# Patient Record
Sex: Male | Born: 1970 | Race: White | Hispanic: No | Smoking: Former smoker
Health system: Southern US, Community
[De-identification: ages and names within clinical notes are randomized; demographics above are authoritative.]

## PROBLEM LIST (undated history)

## (undated) DIAGNOSIS — K859 Acute pancreatitis without necrosis or infection, unspecified: Secondary | ICD-10-CM

## (undated) DIAGNOSIS — E119 Type 2 diabetes mellitus without complications: Secondary | ICD-10-CM

## (undated) DIAGNOSIS — I10 Essential (primary) hypertension: Secondary | ICD-10-CM

## (undated) DIAGNOSIS — G8929 Other chronic pain: Secondary | ICD-10-CM

## (undated) DIAGNOSIS — I2510 Atherosclerotic heart disease of native coronary artery without angina pectoris: Secondary | ICD-10-CM

## (undated) DIAGNOSIS — Z800 Family history of malignant neoplasm of digestive organs: Secondary | ICD-10-CM

## (undated) DIAGNOSIS — Z833 Family history of diabetes mellitus: Secondary | ICD-10-CM

## (undated) DIAGNOSIS — M109 Gout, unspecified: Secondary | ICD-10-CM

## (undated) DIAGNOSIS — F1010 Alcohol abuse, uncomplicated: Secondary | ICD-10-CM

## (undated) DIAGNOSIS — M549 Dorsalgia, unspecified: Secondary | ICD-10-CM

## (undated) DIAGNOSIS — Z8249 Family history of ischemic heart disease and other diseases of the circulatory system: Secondary | ICD-10-CM

## (undated) DIAGNOSIS — E781 Pure hyperglyceridemia: Secondary | ICD-10-CM

## (undated) DIAGNOSIS — Z720 Tobacco use: Secondary | ICD-10-CM

## (undated) DIAGNOSIS — I5189 Other ill-defined heart diseases: Secondary | ICD-10-CM

## (undated) HISTORY — DX: Atherosclerotic heart disease of native coronary artery without angina pectoris: I25.10

## (undated) HISTORY — PX: NO PAST SURGERIES: SHX2092

---

## 2005-10-08 ENCOUNTER — Emergency Department: Payer: Self-pay | Admitting: Emergency Medicine

## 2006-06-14 ENCOUNTER — Emergency Department: Payer: Self-pay | Admitting: Emergency Medicine

## 2010-11-05 ENCOUNTER — Inpatient Hospital Stay: Payer: Self-pay | Admitting: Psychiatry

## 2010-11-08 ENCOUNTER — Ambulatory Visit: Payer: Self-pay | Admitting: Unknown Physician Specialty

## 2010-11-30 ENCOUNTER — Ambulatory Visit: Payer: Self-pay | Admitting: Unknown Physician Specialty

## 2011-01-07 ENCOUNTER — Ambulatory Visit: Payer: Self-pay | Admitting: Internal Medicine

## 2011-01-28 DIAGNOSIS — K219 Gastro-esophageal reflux disease without esophagitis: Secondary | ICD-10-CM | POA: Insufficient documentation

## 2011-01-28 DIAGNOSIS — R7302 Impaired glucose tolerance (oral): Secondary | ICD-10-CM | POA: Insufficient documentation

## 2011-01-28 DIAGNOSIS — Z720 Tobacco use: Secondary | ICD-10-CM | POA: Insufficient documentation

## 2011-01-28 DIAGNOSIS — M545 Low back pain, unspecified: Secondary | ICD-10-CM | POA: Insufficient documentation

## 2011-01-28 DIAGNOSIS — I10 Essential (primary) hypertension: Secondary | ICD-10-CM

## 2011-01-28 DIAGNOSIS — E781 Pure hyperglyceridemia: Secondary | ICD-10-CM

## 2011-01-28 DIAGNOSIS — M109 Gout, unspecified: Secondary | ICD-10-CM | POA: Insufficient documentation

## 2011-01-28 DIAGNOSIS — G8929 Other chronic pain: Secondary | ICD-10-CM | POA: Insufficient documentation

## 2011-10-07 ENCOUNTER — Emergency Department: Payer: Self-pay | Admitting: Emergency Medicine

## 2011-10-07 LAB — CBC
HCT: 46.4 % (ref 40.0–52.0)
HGB: 16.3 g/dL (ref 13.0–18.0)
MCHC: 35.2 g/dL (ref 32.0–36.0)
MCV: 96 fL (ref 80–100)
Platelet: 173 10*3/uL (ref 150–440)
RBC: 4.83 10*6/uL (ref 4.40–5.90)

## 2011-10-07 LAB — TROPONIN I: Troponin-I: <0.02 ng/mL

## 2011-10-07 LAB — COMPREHENSIVE METABOLIC PANEL WITH GFR
Albumin: 4.1 g/dL
Alkaline Phosphatase: 84 U/L
Anion Gap: 11
BUN: 8 mg/dL
Bilirubin,Total: 1 mg/dL
Calcium, Total: 9 mg/dL
Chloride: 101 mmol/L
Co2: 25 mmol/L
Creatinine: 0.72 mg/dL
EGFR (African American): >60
EGFR (Non-African Amer.): >60
Glucose: 123 mg/dL — ABNORMAL HIGH
Osmolality: 274
Potassium: 3.7 mmol/L
SGOT(AST): 51 U/L — ABNORMAL HIGH
SGPT (ALT): 52 U/L
Sodium: 137 mmol/L
Total Protein: 8 g/dL

## 2012-02-14 ENCOUNTER — Emergency Department: Payer: Self-pay | Admitting: Emergency Medicine

## 2012-07-14 ENCOUNTER — Emergency Department: Payer: Self-pay | Admitting: Emergency Medicine

## 2012-07-14 LAB — COMPREHENSIVE METABOLIC PANEL
Anion Gap: 10 (ref 7–16)
BUN: 12 mg/dL (ref 7–18)
Calcium, Total: 9.4 mg/dL (ref 8.5–10.1)
Chloride: 109 mmol/L — ABNORMAL HIGH (ref 98–107)
EGFR (African American): >60
EGFR (Non-African Amer.): >60
Glucose: 146 mg/dL — ABNORMAL HIGH (ref 65–99)
SGOT(AST): 54 U/L — ABNORMAL HIGH (ref 15–37)

## 2012-07-14 LAB — CBC
HGB: 16.6 g/dL (ref 13.0–18.0)
RBC: 5.07 10*6/uL (ref 4.40–5.90)

## 2012-08-17 ENCOUNTER — Inpatient Hospital Stay: Payer: Self-pay | Admitting: Family Medicine

## 2012-08-17 LAB — CBC
HGB: 15 g/dL (ref 13.0–18.0)
MCH: 31.4 pg (ref 26.0–34.0)
MCHC: 33.8 g/dL (ref 32.0–36.0)
MCV: 93 fL (ref 80–100)
Platelet: 259 10*3/uL (ref 150–440)
RBC: 4.78 10*6/uL (ref 4.40–5.90)
RDW: 13.4 % (ref 11.5–14.5)
WBC: 23.8 10*3/uL — ABNORMAL HIGH (ref 3.8–10.6)

## 2012-08-17 LAB — BASIC METABOLIC PANEL
Anion Gap: 11 (ref 7–16)
BUN: 28 mg/dL — ABNORMAL HIGH (ref 7–18)
Calcium, Total: 9.2 mg/dL (ref 8.5–10.1)
Chloride: 97 mmol/L — ABNORMAL LOW (ref 98–107)
Co2: 21 mmol/L (ref 21–32)
Creatinine: 1.99 mg/dL — ABNORMAL HIGH (ref 0.60–1.30)
EGFR (African American): 47 — ABNORMAL LOW
Glucose: 212 mg/dL — ABNORMAL HIGH (ref 65–99)
Osmolality: 271 (ref 275–301)
Sodium: 129 mmol/L — ABNORMAL LOW (ref 136–145)

## 2012-08-17 LAB — URINALYSIS, COMPLETE
Bilirubin,UR: NEGATIVE
Blood: NEGATIVE
Glucose,UR: NEGATIVE mg/dL (ref 0–75)
Ketone: NEGATIVE
Nitrite: NEGATIVE
Ph: 5 (ref 4.5–8.0)
Protein: NEGATIVE
RBC,UR: <1 /HPF (ref 0–5)
Specific Gravity: 1.011 (ref 1.003–1.030)
Squamous Epithelial: <1
WBC UR: 2 /HPF (ref 0–5)

## 2012-08-17 LAB — CK TOTAL AND CKMB (NOT AT ARMC)
CK, Total: 53 U/L (ref 35–232)
CK-MB: <0.5 ng/mL — ABNORMAL LOW (ref 0.5–3.6)

## 2012-08-17 LAB — HEPATIC FUNCTION PANEL A (ARMC)
Albumin: 3.9 g/dL (ref 3.4–5.0)
Alkaline Phosphatase: 98 U/L (ref 50–136)
Total Protein: 7.4 g/dL (ref 6.4–8.2)

## 2012-08-17 LAB — DIFFERENTIAL
Basophil %: 0.8 %
Basophil: 1 %
Comment - H1-Com1: NORMAL
Eosinophil #: 0.1 10*3/uL (ref 0.0–0.7)
Eosinophil %: 0.2 %
Lymphocyte #: 2.8 10*3/uL (ref 1.0–3.6)
Lymphocytes: 14 %
Monocyte #: 2.3 x10 3/mm — ABNORMAL HIGH (ref 0.2–1.0)
Monocyte %: 9.5 %
Monocytes: 8 %
Segmented Neutrophils: 77 %

## 2012-08-17 LAB — TROPONIN I: Troponin-I: <0.02 ng/mL

## 2012-08-18 LAB — BASIC METABOLIC PANEL
Anion Gap: 8 (ref 7–16)
BUN: 28 mg/dL — ABNORMAL HIGH (ref 7–18)
Calcium, Total: 8.6 mg/dL (ref 8.5–10.1)
Co2: 25 mmol/L (ref 21–32)
EGFR (African American): >60
Osmolality: 275 (ref 275–301)
Potassium: 3.8 mmol/L (ref 3.5–5.1)
Sodium: 134 mmol/L — ABNORMAL LOW (ref 136–145)

## 2012-08-18 LAB — CBC WITH DIFFERENTIAL/PLATELET
Basophil #: 0.2 10*3/uL — ABNORMAL HIGH (ref 0.0–0.1)
Basophil %: 1 %
HCT: 40.7 % (ref 40.0–52.0)
Lymphocyte %: 22.2 %
MCH: 30.8 pg (ref 26.0–34.0)
MCHC: 33.2 g/dL (ref 32.0–36.0)
MCV: 93 fL (ref 80–100)
Monocyte #: 2 x10 3/mm — ABNORMAL HIGH (ref 0.2–1.0)
Neutrophil %: 63.9 %
WBC: 18.6 10*3/uL — ABNORMAL HIGH (ref 3.8–10.6)

## 2012-08-18 LAB — MAGNESIUM: Magnesium: 1.5 mg/dL — ABNORMAL LOW

## 2012-08-19 LAB — URINE CULTURE

## 2012-08-23 LAB — CULTURE, BLOOD (SINGLE)

## 2012-09-25 DIAGNOSIS — M542 Cervicalgia: Secondary | ICD-10-CM | POA: Insufficient documentation

## 2013-04-30 ENCOUNTER — Emergency Department: Payer: Self-pay | Admitting: Internal Medicine

## 2013-06-26 ENCOUNTER — Emergency Department: Payer: Self-pay | Admitting: Emergency Medicine

## 2013-09-22 ENCOUNTER — Inpatient Hospital Stay: Payer: Self-pay | Admitting: Psychiatry

## 2013-09-22 LAB — URINALYSIS, COMPLETE
Bacteria: NONE SEEN
Bilirubin,UR: NEGATIVE
Glucose,UR: NEGATIVE mg/dL (ref 0–75)
KETONE: NEGATIVE
Leukocyte Esterase: NEGATIVE
Nitrite: NEGATIVE
PH: 5 (ref 4.5–8.0)
Protein: NEGATIVE
Specific Gravity: 1.006 (ref 1.003–1.030)

## 2013-09-22 LAB — COMPREHENSIVE METABOLIC PANEL
ALK PHOS: 155 U/L — AB
Albumin: 4.2 g/dL (ref 3.4–5.0)
Anion Gap: 7 (ref 7–16)
BUN: 9 mg/dL (ref 7–18)
Bilirubin,Total: 0.6 mg/dL (ref 0.2–1.0)
CHLORIDE: 102 mmol/L (ref 98–107)
Calcium, Total: 8.3 mg/dL — ABNORMAL LOW (ref 8.5–10.1)
Co2: 24 mmol/L (ref 21–32)
Creatinine: 0.8 mg/dL (ref 0.60–1.30)
EGFR (African American): >60
EGFR (Non-African Amer.): >60
GLUCOSE: 165 mg/dL — AB (ref 65–99)
Osmolality: 269 (ref 275–301)
POTASSIUM: 4.4 mmol/L (ref 3.5–5.1)
SGOT(AST): 58 U/L — ABNORMAL HIGH (ref 15–37)
SGPT (ALT): 57 U/L (ref 12–78)
SODIUM: 133 mmol/L — AB (ref 136–145)
Total Protein: 8.4 g/dL — ABNORMAL HIGH (ref 6.4–8.2)

## 2013-09-22 LAB — CBC
HCT: 52.6 % — AB (ref 40.0–52.0)
HGB: 18.4 g/dL — AB (ref 13.0–18.0)
MCH: 33.9 pg (ref 26.0–34.0)
MCHC: 35 g/dL (ref 32.0–36.0)
MCV: 97 fL (ref 80–100)
Platelet: 240 10*3/uL (ref 150–440)
RBC: 5.42 10*6/uL (ref 4.40–5.90)
RDW: 12.3 % (ref 11.5–14.5)
WBC: 16.5 10*3/uL — ABNORMAL HIGH (ref 3.8–10.6)

## 2013-09-22 LAB — SALICYLATE LEVEL: SALICYLATES, SERUM: 4.5 mg/dL — AB

## 2013-09-22 LAB — ACETAMINOPHEN LEVEL: Acetaminophen: <2 ug/mL

## 2013-09-22 LAB — ETHANOL
Ethanol %: <0.003 % (ref 0.000–0.080)
Ethanol: <3 mg/dL

## 2013-09-22 LAB — DRUG SCREEN, URINE
Amphetamines, Ur Screen: NEGATIVE (ref ?–1000)
BENZODIAZEPINE, UR SCRN: NEGATIVE (ref ?–200)
Barbiturates, Ur Screen: POSITIVE (ref ?–200)
CANNABINOID 50 NG, UR ~~LOC~~: POSITIVE (ref ?–50)
COCAINE METABOLITE, UR ~~LOC~~: POSITIVE (ref ?–300)
MDMA (ECSTASY) UR SCREEN: NEGATIVE (ref ?–500)
Methadone, Ur Screen: NEGATIVE (ref ?–300)
OPIATE, UR SCREEN: NEGATIVE (ref ?–300)
Phencyclidine (PCP) Ur S: NEGATIVE (ref ?–25)
Tricyclic, Ur Screen: NEGATIVE (ref ?–1000)

## 2013-09-25 LAB — URIC ACID: Uric Acid: 6.5 mg/dL (ref 3.5–7.2)

## 2013-10-18 ENCOUNTER — Ambulatory Visit: Payer: Self-pay | Admitting: Podiatry

## 2014-01-02 ENCOUNTER — Emergency Department: Payer: Self-pay | Admitting: Emergency Medicine

## 2014-01-24 ENCOUNTER — Emergency Department: Payer: Self-pay | Admitting: Emergency Medicine

## 2014-01-24 LAB — CBC WITH DIFFERENTIAL/PLATELET
BASOS ABS: 0.1 10*3/uL (ref 0.0–0.1)
BASOS PCT: 1 %
EOS PCT: 2.1 %
Eosinophil #: 0.3 10*3/uL (ref 0.0–0.7)
HCT: 43.2 % (ref 40.0–52.0)
HGB: 14.6 g/dL (ref 13.0–18.0)
Lymphocyte #: 2.9 10*3/uL (ref 1.0–3.6)
Lymphocyte %: 19.7 %
MCH: 33.2 pg (ref 26.0–34.0)
MCHC: 33.8 g/dL (ref 32.0–36.0)
MCV: 98 fL (ref 80–100)
Monocyte #: 1 x10 3/mm (ref 0.2–1.0)
Monocyte %: 6.8 %
NEUTROS PCT: 70.4 %
Neutrophil #: 10.4 10*3/uL — ABNORMAL HIGH (ref 1.4–6.5)
Platelet: 179 10*3/uL (ref 150–440)
RBC: 4.4 10*6/uL (ref 4.40–5.90)
RDW: 12.5 % (ref 11.5–14.5)
WBC: 14.7 10*3/uL — AB (ref 3.8–10.6)

## 2014-01-24 LAB — BASIC METABOLIC PANEL
Anion Gap: 4 — ABNORMAL LOW (ref 7–16)
BUN: 19 mg/dL — ABNORMAL HIGH (ref 7–18)
CALCIUM: 8.8 mg/dL (ref 8.5–10.1)
CREATININE: 0.98 mg/dL (ref 0.60–1.30)
Chloride: 100 mmol/L (ref 98–107)
Co2: 27 mmol/L (ref 21–32)
Glucose: 164 mg/dL — ABNORMAL HIGH (ref 65–99)
OSMOLALITY: 269 (ref 275–301)
Potassium: 3.8 mmol/L (ref 3.5–5.1)
Sodium: 131 mmol/L — ABNORMAL LOW (ref 136–145)

## 2014-01-24 LAB — URIC ACID: Uric Acid: 9.7 mg/dL — ABNORMAL HIGH (ref 3.5–7.2)

## 2014-09-28 DIAGNOSIS — F32A Depression, unspecified: Secondary | ICD-10-CM | POA: Insufficient documentation

## 2014-10-21 NOTE — Discharge Summary (Signed)
PATIENT NAME:  Donald Hart, Donald Hart MR#:  [id number] DATE OF BIRTH:  May 06, 1971  DATE OF ADMISSION:  08/17/2012 DATE OF DISCHARGE:  08/18/2012  REASON FOR ADMISSION: Back pain and weakness.   FINAL DIAGNOSES:  1. Chronic back pain with exacerbation.  2. Gouty arthritis.  3. Dehydration.  4. Acute kidney injury related to dehydration.  5. Hyperglycemia due to stress.  6. Hyponatremia due to the use of chlorthalidone and dehydration.  7. Elevated white blood count likely due to stress and dehydration.   IMPORTANT RESULTS: Glucose 212 on admission, 117 at discharge. BUN 28. Creatinine 1.99 on admission, down to 1.28 at discharge. Sodium 129 on admission, 134 at discharge. LFTs within normal limits. Troponin was negative. White count on admission 23,000, at discharge 18.6. Blood cultures no growth. Urine culture is no growth. Urinalysis: No signs of infection.   EKG: Normal sinus rhythm.   DISPOSITION: Home. The patient would like to go home anyway because he feels like he cannot afford to lose more work. The patient wants to return to work tomorrow.   MEDICATIONS AT DISCHARGE: Amlodipine 10 mg once a day, citalopram 20 mg once a day, lisinopril 40 mg once a day (start the following day on 08/19/2012, hold it for discharge day), acetaminophen with oxycodone every 4 hours p.r.n. pain, new prescription of Uloric or febuxostat 40 mg once a day for gout, prednisone 20 mg tablet 2 tablets orally once a day for gout for 2 more days.   Recommended the patient to stop chlorthalidone as this will exacerbate his gout and decrease his sodium. The patient came with gout and with hyponatremia. Allopurinol has been stopped as it is not working as per the patient can tell me.   His primary care physician is Dr. Zada Finderslmedo. The patient is to follow up with him due to a couple of things:  1. Chest x-ray showed a left upper lobe abnormality that might be a scar, but it needs to be followed up. So, recommend for Dr.  Zada Finderslmedo to repeat an AP and lateral chest x-ray and if that abnormality is still present, to proceed getting a CT scan. The patient is aware of this, and he has been told to talk to Dr. Zada Finderslmedo about this.   HOSPITAL COURSE: The patient had admission 08/17/2012, likely having a polyarticular gout flare. Finished prolonged prednisone taper the day before. The patient has allopurinol and colchicine and presented with worsening back pain, with severe dehydration and acute kidney injury. The patient's creatinine was 1.99, and his sodium was 129. The patient looked severely dehydrated, and his white count was 23,000, likely due to steroids. The patient had drop of his white count prior to discharge, but he was put back on steroids due to severe flare-up. The patient has been recommended to go to see Dr. Zada Finderslmedo. He says that he needs to go home and go back to work as he cannot afford to lose more work.   The patient has been given a prescription for Uloric as change of prescription as the allopurinol was not working. The patient was also taking chlorthalidone which one of the side effects is exacerbation of gout, for what I stopped that medication. He also came with acute kidney injury and hyponatremia, for what that medication will make those things worse as well. He is taking an ACE inhibitor which I told him to hold for another day and to start taking it on the 19th as he came with acute kidney injury. His  creatinine is normal now.    TIME SPENT: I spent about 40 minutes with this discharge.     ____________________________ Felipa Furnace, MD rsg:gb Hart: 08/20/2012 16:53:26 ET T: 08/21/2012 04:13:24 ET JOB#: [id number]  cc: Felipa Furnace, MD, <Dictator> [name], MD Danialle Dement Juanda Chance MD ELECTRONICALLY SIGNED 08/22/2012 22:45

## 2014-10-21 NOTE — H&P (Signed)
PATIENT NAME:  Donald Hart, Donald Hart MR#:  [id number] DATE OF BIRTH:  09-Jun-1971  DATE OF ADMISSION:  08/17/2012  PRIMARY CARE PHYSICIAN: [facility]. REFERRING PHYSICIAN: Dorothea GlassmanPaul Malinda, MD  CHIEF COMPLAINT: Weakness, back pain.   HISTORY OF PRESENT ILLNESS: The patient is a 44 year old Caucasian male with a history of polyarticular gout with a recent gout flare, who had just finished a prolonged prednisone taper yesterday or the day before. In addition, allopurinol and colchicine, hypertension with chronic back pain, who presents with above chief complaint. The patient stated that he has been experiencing progressive crescendo-type of a back pain mostly in the lumbar spine area, but it was worse today. The patient has been having this pain for multiple months. The patient also complained of some neck pain without significant photophobia or neck stiffness. He has had poor p.o. intake, feels weak and dizzy when he stands up. He has been taking his blood pressure medications including diuretic and ACE inhibitor. On arrival, he was noted to have renal failure with creatinine of 1.99 and sodium of 129 and some leukocytosis and WBC of 23.8000. A CT of abdomen and pelvis without contrast was performed, which did not show any ureterolithiasis or  obstructive uropathy. X-ray of the chest does not show any significant evidence for pneumonia. Hospitalist services were contacted for further evaluation and management. The patient has no complaints of fever, but feels overall weak   REVIEW OF SYSTEMS:   CONSTITUTIONAL: No fever, weakness, fatigue. No weight changes.  EYES: No blurry vision or double vision.  ENT: No tinnitus or hearing loss. No discharge.  RESPIRATORY: No significant wheeze, has dry cough. No diagnosis of COPD or asthma.  CARDIOVASCULAR: Denies chest pain, swelling in the legs or dyspnea on exertion, has high blood pressure.  GASTROINTESTINAL: Some nausea. No vomiting, abdominal pain or rectal bleeding.   GENITOURINARY: Denies dysuria or hematuria.  HEMATOLOGIC/LYMPHATIC: Denies anemia or easy bruising.  ENDOCRINE: Denies polyuria or nocturia.  SKIN: Denies any new rashes.  MUSCULOSKELETAL: Has chronic back pain.   NEUROLOGIC: Denies numbness or focal weakness. PSYCHIATRIC:  Denies anxiety or depression or insomnia.   PHYSICAL EXAMINATION: VITAL SIGNS: Temperature on arrival 98.3, pulse 106, respiratory rate 20,  initially blood pressure initially was 102/56, oxygen sat 100% on room air.  GENERAL: The patient well-developed Caucasian male sitting in bed in no obvious distress.  HEENT: Normocephalic, atraumatic. Pupils are equal and reactive without any evidence for photophobia. Extraocular muscles intact. There is point tenderness on cervical spine on multiple joints palpation.  Dry mucous membranes.  NECK: Supple. No thyroid tenderness. No cervical lymphadenopathy. Good range of motion of the neck on both sides. Negative Kernig and Brudzinski.    CARDIOVASCULAR: S1, S2, regular rate and rhythm. No murmurs, rubs, or gallops.  LUNGS: Clear to auscultation. [name] or rhonchi.  ABDOMEN: Soft, nontender, nondistended. Positive bowel sounds in all quadrants.  EXTREMITIES: No significant lower extremity edema.  NEUROLOGIC: Cranial nerves II through XII grossly intact. No photophobia. Positive straight leg raise on the left more so than the right. Negative Kernig and Brudzinski. Strength is 5/5 all extremities, but examination is limited due to pain elicited on lower extremity movements. Sensation is intact to light touch.  PSYCHIATRIC: Awake, alert, oriented x 3. Pleasant, cooperative.    PAST MEDICAL HISTORY: Gout, hypertension, chronic back pain.   SOCIAL HISTORY: Still smokes a pack a day and no alcohol, although he was a heavy drinker for a couple of decades in the past, stopped  it secondary to precipitation of gout. No drug use. Works in Orthoptist heavy objects.   ALLERGIES:  None.   PAST SURGICAL HISTORY: He denies.   FAMILY HISTORY: Dad with diabetes.   OUTPATIENT MEDICATIONS: Allopurinol 300 mg 2 times a day, amlodipine 10 mg daily, chlorthalidone 25 mg daily, citalopram 20 mg daily, lisinopril 40 mg daily.   LABORATORY DATA: Glucose 212, BUN 28, creatinine 1.99, sodium 129, potassium 4.2, chloride 97. LFTs within normal limits. Troponin negative. CK-MB negative x 1. WBC 23.8000, hemoglobin 15, hematocrit 44.4 and platelets 259.   Urinalysis not suggestive of infection.   CT of the abdomen and pelvis as above.  EKG showing normal sinus rhythm, rate is 94, no acute ST elevations or depressions.   ASSESSMENT AND PLAN: We have a 44 year old Caucasian male with history of gout, hypertension, chronic back pain for several months. This has progressed. Presents with above chief complaint and was found with acute renal failure, hyponatremia. The patient does have some back pain and some leukocytosis as well. In regards to the acute renal failure, this is likely secondary to poor p.o. intake in addition to taking his medication, including ACE inhibitor, lisinopril, chlorthalidone as well as colchicine. He has had poor p.o. intake for a couple of weeks and the possibility of acute tubular necrosis is there. I will start him on some IV fluids and see how he does. We would hold nephrotoxins and hydrate the patient. His hyponatremia is likely secondary to dehydration and diuretic use, which we would hold at this point and see how he does with normal saline. His back pain appears to be radicular with positive leg raise. He works in a Orthoptist heavy objects and boxes. We will get a CT of lumbar spine and start him on some morphine p.r.n. and obtain a physical therapy consult.  We would hold his ACE inhibitor and diuretics, continue his amlodipine and see how he does with IV fluids as his blood pressure is borderline given he is dehydrated. He does have tobacco abuse. He was  counseled for 3 minutes. Start him on heparin for deep vein thrombosis prophylaxis.   TOTAL TIME SPENT: 55 minutes.   CODE STATUS: Patient is full code.    ____________________________ Krystal Eaton, MD sa:cc Hart: 08/17/2012 21:39:10 ET T: 08/18/2012 00:10:05 ET JOB#: [id number]  cc: Krystal Eaton, MD, <Dictator> Krystal Eaton MD ELECTRONICALLY SIGNED 09/07/2012 13:07

## 2014-10-22 NOTE — Discharge Summary (Signed)
PATIENT NAME:  Donald Hart, Donald Hart MR#:  [id number] DATE OF BIRTH:  October 24, 1970  DATE OF ADMISSION:  09/22/2013 DATE OF DISCHARGE:  09/27/2013  HOSPITAL COURSE: See dictated history and physical for details of admission. A 44 year old man admitted to the psychiatry service for substance-induced depression. Mood was depressed and hopeless. The patient required detox from alcohol and opiates. Also having chronic pain. He was treated in the hospital with the alcohol detox protocol as well as a 3-day taper of Suboxone. Supportive therapy was done as well as educational therapy and substance abuse counseling. No antidepressants were required. Mood improved and at no time was he reporting suicidal ideation. He did not have seizures or delirium. The patient does continue to have severe chronic pain. I had requested a rheumatology consult but it was not available over the weekend. The patient wants to be discharged today so he is being discharged and will have to follow up with rheumatology as an outpatient. I did check is uric acid level which was normal. We x-rayed his foot to document some of his arthritis. He has been given a 4-day burst of steroids to try and ease some of the pain in his joints. He has been given time off work for the next week to try and get some medical treatment. He is discharged with a plan that he will follow psychiatrically with [facility].   MENTAL STATUS EXAMINATION AT DISCHARGE: Casually dressed, neatly groomed man looks his stated age. Cooperative with the interview. Eye contact good. Psychomotor activity still a little bit slow. Affect a little blunted. Mood stated as being okay. Thoughts are lucid, a little bit slow, but not disorganized or bizarre. Denies auditory or visual hallucinations. Denies suicidal or homicidal ideation. Insight and judgment good. Intelligence normal. Short and long-term memory intact. Alert and oriented x 4.   LABORATORY RESULTS: Admission labs included a drug screen  positive for cocaine, cannabis, and barbiturates. Glucose level elevated at 165, sodium low 133, calcium low 8.5. Alcohol not detected. CBC: Increased white count 16.5, increased hematocrit 52.6. Urinalysis: A little bit of blood not infected. Uric after level 6.5 on the 28th.   An x-ray of the left foot done to document his arthritis showed early degenerative changes at the first MTP joint and DIP joints with diffuse joint space narrowing. No bony abnormality.   DISCHARGE MEDICATIONS: Prednisone 20 mg 2 tablets a day for the next 2 days only, meloxicam 15 mg a day, lisinopril 40 mg per day, Catapres patch 0.1 mg weekly, colchicine 0.6 mg once a day, pantoprazole 40 mg twice, and febuxostat 40 mg twice a day.   DISPOSITION: Discharge home. Follow up with [facility] and with Dr. Gavin PottersKernodle this week.   DIAGNOSIS, PRINCIPAL AND PRIMARY:   AXIS I: Substance-induced depression revolved.   SECONDARY DIAGNOSES:  AXIS I: Alcohol dependence.  Opiate dependence.   AXIS II: Deferred.   AXIS III: Chronic severe pain, gastric reflux disease, hypertension, gout.   AXIS IV: Severe from pain and how it is affecting his work and home life.   AXIS V: Functioning at time of discharge 57.    ____________________________ Audery AmelJohn T. Akasha Melena, MD jtc:lt Hart: 09/27/2013 23:31:37 ET T: 09/28/2013 06:11:01 ET JOB#: [id number]  cc: Audery AmelJohn T. Clarke Peretz, MD, <Dictator> Audery AmelJOHN T Talah Cookston MD ELECTRONICALLY SIGNED 09/28/2013 11:07

## 2014-10-22 NOTE — H&P (Signed)
PATIENT NAME:  Donald Hart, Donald Hart MR#:  [id number] DATE OF BIRTH:  Oct 19, 1970  DATE OF ADMISSION:  09/22/2013  IDENTIFYING INFORMATION AND CHIEF COMPLAINT: This is a 44 year old gentleman with a history of chronic pain, depressed mood, and recent suicidal ideation in the context of heavy substance abuse, voluntarily coming to the hospital.   CHIEF COMPLAINT: "I need help."   HISTORY OF PRESENT ILLNESS: Information obtained from the patient and the chart. The patient reports that his mood has been feeling depressed and down. Has hopeless feelings. Tired all the time. Cannot sleep at night. Has started having some hopeless thoughts or thoughts about wishing that he were dead. He is drinking alcohol, approximately 12 beers a day, which has been escalating. He feels that is out of control. Additionally, he is abusing large amounts of pain medicine. He estimates about 100 to 150 mg of oxycodone daily, which he is getting off the street. He feels out of control of his behavior. He has been unable to function well at his job, and his relationship with his family is getting worse.   PAST PSYCHIATRIC HISTORY: He has had previous admissions to the psychiatric ward for alcohol detox in the past, and has been referred for outpatient treatment, but has not followed up very thoroughly related to wanting to stick with his work. He has no history of suicide attempts. He has been treated with antidepressants, without any clear benefit in the past. No history of psychotic symptoms.   FAMILY HISTORY: Denies family history of mental illness.   SOCIAL HISTORY: Married, has two daughters, one of whom still lives at home. 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 Reports that he has support from his family.   MEDICAL HISTORY: The patient has documented severe gout, which has been poorly responsive to treatment with allopurinol. He has not been following up very well with  his outpatient doctor recently. His gout is flaring up, which is a big source of his chronic pain. The patient also has history of high blood pressure, and has been only intermittently compliant with treatment of that.   SUBSTANCE ABUSE HISTORY: As noted, he is abusing alcohol and pain medication. Says he occasionally uses marijuana. Denies using any other drugs, although he does have barbiturates in his drug screen. He cannot reconcile that. He denies any history of DTs or seizures on withdrawal.   REVIEW OF SYSTEMS: Feels achy and in pain all over, especially in his legs. Very tired and run down. Mood depressed. Hopeless feeling. No hallucinations. No delusions. Denies acute suicidal intent. No homicidal ideation. Other than the pain, physical review of systems is negative, full range.   CURRENT MEDICATIONS: Meloxicam 15 mg a day, amlodipine 10 mg a day, lisinopril 40 mg a day.   ALLERGIES: No known drug allergies.   MENTAL STATUS EXAMINATION: Disheveled-seeming, chronically and acutely sick- seeming gentleman. Cooperative but slow. Poor eye contact. Decreased psychomotor activity. He is clearly in a lot of pain, has trouble even sitting up. Speech is decreased in total amount, but understandable. Affect is dysphoric, down, and pained. Mood is stated as bad. Thoughts are slow. Lucid, no obvious delusional thinking. Denies hallucinations. Denies acute suicidal intent. Does have hopelessness. Insight and judgment adequate. Intelligence appears normal. Full normal fund of knowledge. Short-and long-term memory intact. Alert and oriented x 4.   PHYSICAL EXAMINATION: GENERAL: Again, this is a sick -appearing gentleman who looks like he is in a lot of  pain. No acute skin lesions, but I note that, as he reports, both of his feet appear to be deformed at the joints. Also to have gouty nodules forming and popping up under the skin. He has trouble even standing up, with an abnormal gait (Dictation Anomaly)  <<MISSING TEXT >> Decreased range of motion due to his pain level right now. Decreased strength, also due to effort. Normal reflexes. Cranial nerves symmetric and normal.  HEENT: His face is symmetric. Oral mucosa very dry. Pupils equal and reactive. He is flushed all over.  LUNGS: Clear, no wheezes.  HEART: Regular rate and rhythm, except to be tachycardic.  ABDOMEN: Nontender.  VITAL SIGNS: Most recent vitals, pulse 98, respirations 18, blood pressure 186/105.   LABORATORY RESULTS: Salicylates elevated at 4.5, probably from pain medicine. Chest x-ray unremarkable. Alcohol level was zero. Multiple abnormalities on the chemistry panel including low sodium at 133, low calcium 8.3, elevated glucose 165, elevated alkaline phosphatase 155, elevated AST at 58. Elevated white count at 16.5, elevated hematocrit 52.6. Urinalysis positive for blood. Drug screen positive for cocaine, cannabis, and barbiturates.   ASSESSMENT: A 44 year old man with substance-induced depression, alcohol dependence, opiate dependence, needs hospitalization, feeling hopeless and out of control. Potential for alcohol withdrawal complications. Multiple medical problems.   TREATMENT PLAN: Admit to the hospital. Alcohol detox in place. Also 3-day taper of Suboxone and medicine for opiate withdrawal. No indication for antidepressants yet. Will try to get his blood pressure under control with clonidine as well as his usual blood pressure medicine. May need to get a Medicine consult. I am going to restart him on Uloric, which had previously been used to treat gout, as well as colchicine regularly.   DIAGNOSIS, PRINCIPAL AND PRIMARY:  AXIS I: Substance-induced depression.   SECONDARY DIAGNOSES: AXIS I:  1.  Alcohol dependence.  2.  Opiate dependence.    AXIS II: No diagnosis.   AXIS III: Gout, chronic pain, hypertension.   AXIS IV: Severe from illness, poor functioning at work and with his family.   AXIS V: Functioning at time  of evaluation:  30.      ____________________________ Audery AmelJohn T. Clapacs, MD jtc:mr D: 09/22/2013 20:15:58 ET T: 09/22/2013 20:43:48 ET JOB#: [id number]  cc: Audery AmelJohn T. Clapacs, MD, <Dictator> Audery AmelJOHN T CLAPACS MD ELECTRONICALLY SIGNED 09/22/2013 23:47

## 2015-01-19 ENCOUNTER — Encounter: Payer: Self-pay | Admitting: *Deleted

## 2015-01-19 ENCOUNTER — Inpatient Hospital Stay
Admission: EM | Admit: 2015-01-19 | Discharge: 2015-01-21 | DRG: 683 | Disposition: A | Discharge status: Home / Self Care | Payer: BLUE CROSS/BLUE SHIELD | Attending: Specialist | Admitting: Specialist

## 2015-01-19 DIAGNOSIS — D72829 Elevated white blood cell count, unspecified: Secondary | ICD-10-CM | POA: Diagnosis present

## 2015-01-19 DIAGNOSIS — Z800 Family history of malignant neoplasm of digestive organs: Secondary | ICD-10-CM | POA: Diagnosis not present

## 2015-01-19 DIAGNOSIS — I10 Essential (primary) hypertension: Secondary | ICD-10-CM | POA: Diagnosis present

## 2015-01-19 DIAGNOSIS — E785 Hyperlipidemia, unspecified: Secondary | ICD-10-CM | POA: Diagnosis present

## 2015-01-19 DIAGNOSIS — F17200 Nicotine dependence, unspecified, uncomplicated: Secondary | ICD-10-CM | POA: Diagnosis present

## 2015-01-19 DIAGNOSIS — G629 Polyneuropathy, unspecified: Secondary | ICD-10-CM | POA: Diagnosis present

## 2015-01-19 DIAGNOSIS — M791 Myalgia, unspecified site: Secondary | ICD-10-CM

## 2015-01-19 DIAGNOSIS — Z833 Family history of diabetes mellitus: Secondary | ICD-10-CM | POA: Diagnosis not present

## 2015-01-19 DIAGNOSIS — Z79899 Other long term (current) drug therapy: Secondary | ICD-10-CM | POA: Diagnosis not present

## 2015-01-19 DIAGNOSIS — Z716 Tobacco abuse counseling: Secondary | ICD-10-CM | POA: Diagnosis present

## 2015-01-19 DIAGNOSIS — E860 Dehydration: Secondary | ICD-10-CM | POA: Diagnosis present

## 2015-01-19 DIAGNOSIS — Z8249 Family history of ischemic heart disease and other diseases of the circulatory system: Secondary | ICD-10-CM | POA: Diagnosis not present

## 2015-01-19 DIAGNOSIS — M109 Gout, unspecified: Secondary | ICD-10-CM | POA: Diagnosis present

## 2015-01-19 DIAGNOSIS — E781 Pure hyperglyceridemia: Secondary | ICD-10-CM | POA: Diagnosis present

## 2015-01-19 DIAGNOSIS — M549 Dorsalgia, unspecified: Secondary | ICD-10-CM | POA: Diagnosis present

## 2015-01-19 DIAGNOSIS — M6282 Rhabdomyolysis: Secondary | ICD-10-CM | POA: Diagnosis present

## 2015-01-19 DIAGNOSIS — F1010 Alcohol abuse, uncomplicated: Secondary | ICD-10-CM | POA: Diagnosis present

## 2015-01-19 DIAGNOSIS — R52 Pain, unspecified: Secondary | ICD-10-CM | POA: Diagnosis present

## 2015-01-19 DIAGNOSIS — N179 Acute kidney failure, unspecified: Principal | ICD-10-CM

## 2015-01-19 HISTORY — DX: Other chronic pain: G89.29

## 2015-01-19 HISTORY — DX: Dorsalgia, unspecified: M54.9

## 2015-01-19 HISTORY — DX: Family history of diabetes mellitus: Z83.3

## 2015-01-19 HISTORY — DX: Family history of malignant neoplasm of digestive organs: Z80.0

## 2015-01-19 HISTORY — DX: Family history of ischemic heart disease and other diseases of the circulatory system: Z82.49

## 2015-01-19 HISTORY — DX: Gout, unspecified: M10.9

## 2015-01-19 HISTORY — DX: Essential (primary) hypertension: I10

## 2015-01-19 LAB — LIPID PANEL
CHOL/HDL RATIO: 7 ratio
Cholesterol: 230 mg/dL — ABNORMAL HIGH (ref 0–200)
HDL: 33 mg/dL — AB (ref >40)
LDL Cholesterol: UNDETERMINED mg/dL (ref 0–99)
TRIGLYCERIDES: 1104 mg/dL — AB (ref <150)
VLDL: UNDETERMINED mg/dL (ref 0–40)

## 2015-01-19 LAB — CBC
HCT: 48.9 % (ref 40.0–52.0)
Hemoglobin: 17 g/dL (ref 13.0–18.0)
MCH: 32.6 pg (ref 26.0–34.0)
MCHC: 34.8 g/dL (ref 32.0–36.0)
MCV: 93.8 fL (ref 80.0–100.0)
Platelets: 205 10*3/uL (ref 150–440)
RBC: 5.21 MIL/uL (ref 4.40–5.90)
RDW: 12.9 % (ref 11.5–14.5)
WBC: 20.7 10*3/uL — ABNORMAL HIGH (ref 3.8–10.6)

## 2015-01-19 LAB — URINE DRUG SCREEN, QUALITATIVE (ARMC ONLY)
AMPHETAMINES, UR SCREEN: NOT DETECTED
Barbiturates, Ur Screen: NOT DETECTED
Benzodiazepine, Ur Scrn: NOT DETECTED
CANNABINOID 50 NG, UR ~~LOC~~: NOT DETECTED
Cocaine Metabolite,Ur ~~LOC~~: NOT DETECTED
MDMA (Ecstasy)Ur Screen: NOT DETECTED
Methadone Scn, Ur: NOT DETECTED
OPIATE, UR SCREEN: POSITIVE — AB
Phencyclidine (PCP) Ur S: NOT DETECTED
Tricyclic, Ur Screen: POSITIVE — AB

## 2015-01-19 LAB — URINALYSIS COMPLETE WITH MICROSCOPIC (ARMC ONLY)
Bilirubin Urine: NEGATIVE
GLUCOSE, UA: NEGATIVE mg/dL
KETONES UR: NEGATIVE mg/dL
LEUKOCYTES UA: NEGATIVE
Nitrite: NEGATIVE
Protein, ur: 30 mg/dL — AB
SPECIFIC GRAVITY, URINE: 1.012 (ref 1.005–1.030)
pH: 5 (ref 5.0–8.0)

## 2015-01-19 LAB — COMPREHENSIVE METABOLIC PANEL
ALBUMIN: 4.4 g/dL (ref 3.5–5.0)
ALT: 37 U/L (ref 17–63)
AST: 43 U/L — ABNORMAL HIGH (ref 15–41)
Alkaline Phosphatase: 99 U/L (ref 38–126)
Anion gap: 18 — ABNORMAL HIGH (ref 5–15)
BUN: 33 mg/dL — ABNORMAL HIGH (ref 6–20)
CHLORIDE: 95 mmol/L — AB (ref 101–111)
CO2: 19 mmol/L — AB (ref 22–32)
CREATININE: 4.46 mg/dL — AB (ref 0.61–1.24)
Calcium: 8.9 mg/dL (ref 8.9–10.3)
GFR calc Af Amer: 17 mL/min — ABNORMAL LOW (ref >60)
GFR calc non Af Amer: 15 mL/min — ABNORMAL LOW (ref >60)
Glucose, Bld: 212 mg/dL — ABNORMAL HIGH (ref 65–99)
Potassium: 4.9 mmol/L (ref 3.5–5.1)
Sodium: UNDETERMINED mmol/L (ref 135–145)
Total Bilirubin: 1.1 mg/dL (ref 0.3–1.2)
Total Protein: UNDETERMINED g/dL (ref 6.5–8.1)

## 2015-01-19 LAB — CK: Total CK: 841 U/L — ABNORMAL HIGH (ref 49–397)

## 2015-01-19 LAB — TROPONIN I: Troponin I: 0.03 ng/mL (ref <0.031)

## 2015-01-19 LAB — MAGNESIUM: Magnesium: 2.3 mg/dL (ref 1.7–2.4)

## 2015-01-19 MED ORDER — MORPHINE SULFATE 4 MG/ML IJ SOLN
4.0000 mg | Freq: Once | INTRAMUSCULAR | Status: AC
  Administered 2015-01-19: 4 mg via INTRAVENOUS
  Filled 2015-01-19: qty 1

## 2015-01-19 MED ORDER — HEPARIN SODIUM (PORCINE) 5000 UNIT/ML IJ SOLN
5000.0000 [IU] | Freq: Three times a day (TID) | INTRAMUSCULAR | Status: DC
  Administered 2015-01-19 – 2015-01-21 (×5): 5000 [IU] via SUBCUTANEOUS
  Filled 2015-01-19 (×4): qty 1

## 2015-01-19 MED ORDER — LORAZEPAM 2 MG/ML IJ SOLN: 1.0000 mg | Freq: Four times a day (QID) | INTRAMUSCULAR | Status: DC | PRN

## 2015-01-19 MED ORDER — ACETAMINOPHEN 650 MG RE SUPP
650.0000 mg | Freq: Four times a day (QID) | RECTAL | Status: DC | PRN
Start: 2015-01-19 — End: 2015-01-21

## 2015-01-19 MED ORDER — PNEUMOCOCCAL VAC POLYVALENT 25 MCG/0.5ML IJ INJ
0.5000 mL | INJECTION | INTRAMUSCULAR | Status: DC
  Filled 2015-01-19: qty 0.5

## 2015-01-19 MED ORDER — ADULT MULTIVITAMIN W/MINERALS CH
1.0000 | ORAL_TABLET | Freq: Every day | ORAL | Status: DC
  Administered 2015-01-19 – 2015-01-21 (×3): 1 via ORAL
  Filled 2015-01-19 (×3): qty 1

## 2015-01-19 MED ORDER — ONDANSETRON HCL 4 MG/2ML IJ SOLN: 4.0000 mg | Freq: Four times a day (QID) | INTRAMUSCULAR | Status: DC | PRN

## 2015-01-19 MED ORDER — THIAMINE HCL 100 MG/ML IJ SOLN: 100.0000 mg | Freq: Every day | INTRAMUSCULAR | Status: DC

## 2015-01-19 MED ORDER — ACETAMINOPHEN 325 MG PO TABS
650.0000 mg | ORAL_TABLET | Freq: Four times a day (QID) | ORAL | Status: DC | PRN
  Administered 2015-01-19: 20:00:00 650 mg via ORAL
  Filled 2015-01-19: qty 2

## 2015-01-19 MED ORDER — SODIUM CHLORIDE 0.9 % IV BOLUS (SEPSIS)
1000.0000 mL | INTRAVENOUS | Status: AC
  Administered 2015-01-19: 1000 mL via INTRAVENOUS

## 2015-01-19 MED ORDER — VITAMIN B-1 100 MG PO TABS
100.0000 mg | ORAL_TABLET | Freq: Every day | ORAL | Status: DC
  Administered 2015-01-19 – 2015-01-21 (×3): 100 mg via ORAL
  Filled 2015-01-19 (×3): qty 1

## 2015-01-19 MED ORDER — ALBUTEROL SULFATE (2.5 MG/3ML) 0.083% IN NEBU: 2.5000 mg | INHALATION_SOLUTION | RESPIRATORY_TRACT | Status: DC | PRN

## 2015-01-19 MED ORDER — FOLIC ACID 1 MG PO TABS
1.0000 mg | ORAL_TABLET | Freq: Every day | ORAL | Status: DC
  Administered 2015-01-19 – 2015-01-21 (×3): 1 mg via ORAL
  Filled 2015-01-19 (×3): qty 1

## 2015-01-19 MED ORDER — MORPHINE SULFATE 2 MG/ML IJ SOLN
2.0000 mg | INTRAMUSCULAR | Status: DC | PRN
  Administered 2015-01-20 – 2015-01-21 (×5): 2 mg via INTRAVENOUS
  Filled 2015-01-19 (×6): qty 1

## 2015-01-19 MED ORDER — ASPIRIN 81 MG PO CHEW
324.0000 mg | CHEWABLE_TABLET | Freq: Once | ORAL | Status: AC
  Administered 2015-01-19: 324 mg via ORAL
  Filled 2015-01-19: qty 4

## 2015-01-19 MED ORDER — SODIUM CHLORIDE 0.9 % IV SOLN
INTRAVENOUS | Status: DC
Start: 2015-01-19 — End: 2015-01-21
  Administered 2015-01-19 – 2015-01-21 (×4): via INTRAVENOUS

## 2015-01-19 MED ORDER — ONDANSETRON HCL 4 MG/2ML IJ SOLN
4.0000 mg | INTRAMUSCULAR | Status: AC
  Administered 2015-01-19: 4 mg via INTRAVENOUS
  Filled 2015-01-19: qty 2

## 2015-01-19 MED ORDER — NICOTINE 14 MG/24HR TD PT24
14.0000 mg | MEDICATED_PATCH | Freq: Every day | TRANSDERMAL | Status: DC
  Administered 2015-01-19 – 2015-01-20 (×2): 14 mg via TRANSDERMAL
  Filled 2015-01-19 (×3): qty 1

## 2015-01-19 MED ORDER — ONDANSETRON HCL 4 MG PO TABS: 4.0000 mg | ORAL_TABLET | Freq: Four times a day (QID) | ORAL | Status: DC | PRN

## 2015-01-19 MED ORDER — LORAZEPAM 1 MG PO TABS: 1.0000 mg | ORAL_TABLET | Freq: Four times a day (QID) | ORAL | Status: DC | PRN

## 2015-01-19 NOTE — ED Notes (Signed)
Pt arrived via EMS from home reporting back, chest and extremity pains. Pt has chronic back pain x 1 year and was previously seen at pain management clinic. 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 Pt reports a lot of bending over with new job. Pt reports SOB and increased tenderness upon palpation of chest. Denies tenderness upon palpation of extremities.

## 2015-01-19 NOTE — H&P (Addendum)
[facility] - [place] [facility]   PATIENT NAME: Donald Hart    MR#:  [id number]  DATE OF BIRTH:  January 16, 1971  DATE OF ADMISSION:  01/19/2015  PRIMARY CARE PHYSICIAN: No primary care provider on file.   REQUESTING/REFERRING PHYSICIAN: Dr. Noland Fordyce York Cerise.  CHIEF COMPLAINT:   Chief Complaint  Patient presents with  . Back Pain  . Chest Pain   dizziness, weakness and body aching.  HISTORY OF PRESENT ILLNESS:  Donald Hart  is a 44 y.o. male with a known history of hypertension, gout and chronic back pain. The patient came to ED due to dizziness, weakness and body aching. He just started a new job yesterday. He worked in the very hot environment the whole day yesterday and half-day today. He feels dizzy and weak, and almost passed out. He also complains of chest pain, abdomen and extremity aching and cramps. He was found a low blood pressure and elevated creatinine at 4.46 in ED, he is being treated with the normal saline IV. He has no urine today. He denies any other symptoms.  PAST MEDICAL HISTORY:   Past Medical History  Diagnosis Date  . Hypertension   . Chronic back pain   . Gout   . Family history of heart attack     Father.   . Family history of diabetes mellitus     sister  . Family history of stomach cancer     PAST SURGICAL HISTORY:  History reviewed. No pertinent past surgical history.  SOCIAL HISTORY:   History  Substance Use Topics  . Smoking status: Current Every Day Smoker -- 1.00 packs/day for 20 years  . Smokeless tobacco: Not on file  . Alcohol Use: 7.2 oz/week    12 Cans of beer per week     Comment: occasionally   has history of drug abuse. But denies any drug abuse this time.  FAMILY HISTORY:   Family History  Problem Relation Age of Onset  . Heart attack Father   . Stomach cancer Father   . Diabetes Father   . Diabetes Sister     DRUG ALLERGIES:  No Known Allergies  REVIEW OF SYSTEMS:  CONSTITUTIONAL: No  fever, headache, dizziness and generalized weakness.  [name], NOSE, [name], shortness of breath, wheezing or hemoptysis.  CARDIOVASCULAR: Haschest pain, no orthopnea, edema.  GASTROINTESTINAL: Has nausea, no vomiting or diarrhea, but has abdominal cramps.  GENITOURINARY: No dysuria, [name], nocturia,  [name], easy bruising or bleeding SKIN: No rash or lesion. MUSCULOSKELETAL: No joint pain or arthritis.  Muscle cramps. NEUROLOGIC: No tingling, numbness, no focal weakness.  PSYCHIATRY: No anxiety or depression.   MEDICATIONS AT HOME:   Prior to Admission medications   Medication Sig Start Date End Date Taking? Authorizing Provider  allopurinol (ZYLOPRIM) 100 MG tablet Take 200 mg by mouth daily.    Yes Historical Provider, MD  amLODipine (NORVASC) 5 MG tablet Take 5 mg by mouth daily.   Yes Historical Provider, MD  carvedilol (COREG) 25 MG tablet Take 25 mg by mouth 2 (two) times daily.   Yes Historical Provider, MD  lisinopril (PRINIVIL,ZESTRIL) 40 MG tablet Take 40 mg by mouth daily.   Yes Historical Provider, MD  naproxen (NAPROSYN) 500 MG tablet Take 500 mg by mouth 2 (two) times daily as needed for mild pain.   Yes Historical Provider, MD  pregabalin (LYRICA) 75 MG capsule Take 75 mg by mouth 3 (three) times daily.   Yes Historical Provider, MD      VITAL SIGNS:  Blood pressure 107/66, pulse 91, temperature 97.9 F (36.6 C), temperature source Oral, resp. rate 14, height 6\' 2"  (1.88 m), weight 109.3 kg (240 lb 15.4 oz), SpO2 98 %.  PHYSICAL EXAMINATION:  GENERAL:  44 y.o.-year-old patient lying in the bed with no acute distress.  EYES: Pupils equal, round, [name], normocephalic. Oropharynx and nasopharynx clear. Dry oral mucosa. NECK:  Supple, no  jugular venous distention. No thyroid enlargement, [name], no wheezing, rales,[name], [name], rubs, [name], mild diffuse tenderness, nondistended. Bowel sounds present. No organomegaly or mass.  EXTREMITIES: No pedal edema, cyanosis, or clubbing.  NEUROLOGIC: Cranial nerves II through XII are intact. Muscle strength 5/5 in all extremities. Sensation intact. Gait not checked.  PSYCHIATRIC: The patient is alert and oriented x 3.  SKIN: No obvious rash, lesion, or ulcer.   LABORATORY PANEL:   CBC  Recent Labs Lab 01/19/15 1623  WBC 20.7*  HGB 17.0  HCT 48.9  PLT 205   ------------------------------------------------------------------------------------------------------------------  Chemistries   Recent Labs Lab 01/19/15 1623  NA PATIENT'S SAMPLE VERY LIPEMIC UNABLE TO REPORT  K 4.9  CL 95*  CO2 19*  GLUCOSE 212*  BUN 33*  CREATININE 4.46*  CALCIUM 8.9  MG 2.3  AST 43*  ALT 37  ALKPHOS 99  BILITOT 1.1   ------------------------------------------------------------------------------------------------------------------  Cardiac Enzymes  Recent Labs Lab 01/19/15 1623  TROPONINI 0.03   ------------------------------------------------------------------------------------------------------------------  RADIOLOGY:  No results found.  EKG:   Orders placed or performed during the hospital encounter of 01/19/15  . ED EKG  . ED EKG    IMPRESSION AND PLAN:   Acute renal failure Leukocytosis Hypertension Tobacco abuse Alcohol abuse  The patient will be admitted to medical floor. I will continue normal saline IV, follow-up BMP, get renal ultrasound and nephrology consult. I will hold lisinopril, Norvasc and ibuprofen due to acute renal failure and low blood pressure. Leukocytosis, unclear etiology.  Possible due to reaction. I will follow-up urinalysis and the urine toxicology. Smoking cessation was counseled for 4 minutes and we will give nicotine patch. I will start CIWA protocol.   All the records are reviewed and case discussed with ED provider. Management plans discussed with the patient, family and they are in agreement.  CODE STATUS: Full code  TOTAL TIME TAKING CARE OF THIS PATIENT: 53  minutes.    Shaune Pollack M.D on 01/19/2015 at 6:18 PM  Between 7am to 6pm - Pager - [phone]  After 6pm go to [email or web address] - password [email or web address] [facility]  [facility]  Office  [phone]  CC: Primary care physician; No primary care provider on file.

## 2015-01-19 NOTE — Progress Notes (Signed)
Notified Dr Imogene Burn that pt c/o back and neck pain, pain level is 10. No pain meds ordered but tylenol. Also, need a clarification for cardiac monitoring order. Do you want to apply telemetry monitor? MD verbalized to give tylenol first and to d/c the cardiac monitoring order.

## 2015-01-19 NOTE — ED Notes (Signed)
Hospitalist at bedside 

## 2015-01-19 NOTE — Progress Notes (Signed)
Notified Dr Sheryle Hail that pt requested pain med other than tylenol. Pt c/o back/neck pain, pain level 10.

## 2015-01-19 NOTE — Plan of Care (Signed)
Problem: Discharge Progression Outcomes Goal: Discharge plan in place and appropriate Outcome: Progressing Individualization:  1. Lives at home with wife and dgt. 2. Reports chronic back pain and has been taking ibuprofen and naprosyn together daily. 3. Moderate fall risk- pt educated to call for assistance when needs to get OOB, keep urinal and personal items in hand's reach. 4. ETOH and tobacco abuse. 5. 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 6. Medical history: gout, HTN controlled with home meds.

## 2015-01-19 NOTE — ED Provider Notes (Signed)
[facility] Emergency Department Provider Note  ____________________________________________  Time seen: Approximately 5:40 PM  I have reviewed the triage vital signs and the nursing notes.   HISTORY  Chief Complaint Back Pain and Chest Pain    HPI Donald Hart is a 44 y.o. male with a history of chronic back pain who goes to a pain clinic and hypertension who presents reporting pain all over his body and fatigue.  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  He states that it is hot where he works with no air conditioning and he has been trying to stay hydrated with water and a lot of Gatorade.  He reports that his pain is all over her and is as severe.  He is having some shortness of breath with exertion.  The chest pain is reproducible with palpation to his chest.   Past Medical History  Diagnosis Date  . Hypertension   . Chronic back pain   . Gout     There are no active problems to display for this patient.   History reviewed. No pertinent past surgical history.  Current Outpatient Rx  Name  Route  Sig  Dispense  Refill  . allopurinol (ZYLOPRIM) 100 MG tablet   Oral   Take 200 mg by mouth daily.          Marland Kitchen amLODipine (NORVASC) 5 MG tablet   Oral   Take 5 mg by mouth daily.         . carvedilol (COREG) 25 MG tablet   Oral   Take 25 mg by mouth 2 (two) times daily.         Marland Kitchen lisinopril (PRINIVIL,ZESTRIL) 40 MG tablet   Oral   Take 40 mg by mouth daily.         . naproxen (NAPROSYN) 500 MG tablet   Oral   Take 500 mg by mouth 2 (two) times daily as needed for mild pain.         . pregabalin (LYRICA) 75 MG capsule   Oral   Take 75 mg by mouth 3 (three) times daily.           Allergies Review of patient's allergies indicates no known allergies.  History reviewed. No pertinent family history.  Social History History   Substance Use Topics  . Smoking status: Current Every Day Smoker -- 1.00 packs/day  . Smokeless tobacco: Not on file  . Alcohol Use: Yes     Comment: occasionally    Review of Systems Constitutional: No fever/chills.  Generalized body aches Eyes: No visual changes. ENT: No sore throat. Cardiovascular: Chest wall is tender. Respiratory: Denies shortness of breath. Gastrointestinal: No abdominal pain.  No nausea, no vomiting.  No diarrhea.  No constipation. Genitourinary: Negative for dysuria. Musculoskeletal: Negative for back pain. Skin: Negative for rash. Neurological: Negative for headaches, focal weakness or numbness.  10-point ROS otherwise negative.  ____________________________________________   PHYSICAL EXAM:  VITAL SIGNS: ED Triage Vitals  Enc Vitals Group     BP 01/19/15 1546 97/59 mmHg     Pulse Rate 01/19/15 1546 112     Resp 01/19/15 1546 17     Temp 01/19/15 1546 97.9 F (36.6 C)     Temp Source 01/19/15 1546 Oral     SpO2 01/19/15 1600 94 %     Weight 01/19/15 1546 240 lb 15.4  oz (109.3 kg)     Height 01/19/15 1546 6\' 2"  (1.88 m)     Head Cir --      Peak Flow --      Pain Score 01/19/15 1547 10     Pain Loc --      Pain Edu? --      Excl. in GC? --     Constitutional: Alert and oriented.  Appears uncomfortable but not necessarily ill-appearing. Eyes: Conjunctivae are normal. PERRL. EOMI. Head: Atraumatic. Nose: No congestion/rhinnorhea. Mouth/Throat: Mucous membranes are dry and his lips are cracked.  Oropharynx non-erythematous. Neck: No stridor.   Cardiovascular: Tachycardic, regular rhythm. Grossly normal heart sounds.  Good peripheral circulation. Respiratory: Normal respiratory effort.  No retractions. Lungs CTAB. Gastrointestinal: Soft and nontender. No distention. No abdominal bruits. No CVA tenderness. Musculoskeletal: No lower extremity tenderness nor edema.  No joint effusions. Neurologic:  Normal speech and language. No gross focal  neurologic deficits are appreciated.  Skin:  Skin is warm, flushed, dry and intact. No rash noted. Psychiatric: flat affect ____________________________________________   LABS (all labs ordered are listed, but only abnormal results are displayed)  Labs Reviewed  CBC - Abnormal; Notable for the following:    WBC 20.7 (*)    All other components within normal limits  COMPREHENSIVE METABOLIC PANEL - Abnormal; Notable for the following:    Chloride 95 (*)    CO2 19 (*)    Glucose, Bld 212 (*)    BUN 33 (*)    Creatinine, Ser 4.46 (*)    AST 43 (*)    GFR calc non Af Amer 15 (*)    GFR calc Af Amer 17 (*)    Anion gap 18 (*)    All other components within normal limits  CK - Abnormal; Notable for the following:    Total CK 841 (*)    All other components within normal limits  MAGNESIUM  TROPONIN I   ____________________________________________  EKG  ED ECG REPORT I, Mccoy Testa, the attending physician, personally viewed and interpreted this ECG.  Date: 01/19/2015 EKG Time: 15:55 Rate: 112 Rhythm: Sinus tachycardia QRS Axis: normal Intervals: normal ST/T Wave abnormalities: normal Conduction Disutrbances: none Narrative Interpretation: unremarkable  ____________________________________________  RADIOLOGY  No results found.  ____________________________________________   PROCEDURES  Procedure(s) performed: None  Critical Care performed: No ____________________________________________   INITIAL IMPRESSION / ASSESSMENT AND PLAN / ED COURSE  Pertinent labs & imaging results that were available during my care of the patient were reviewed by me and considered in my medical decision making (see chart for details).  The patient looked dry and so I evaluated broadly concern for rhabdomyolysis.  In fact his CK is elevated in the 800s, he has acute renal failure with a creatinine of 4.4, his white count is 28, and his blood is so lipemic that we cannot get an  accurate electrolyte panel.  I have added on a lipid panel and a hemoglobin A1c to his labs.  I have given the patient 2 L of IV fluid and spoke with the hospitalist for admission.  ____________________________________________  FINAL CLINICAL IMPRESSION(S) / ED DIAGNOSES  Final diagnoses:  Acute renal failure, unspecified acute renal failure type  Hyperlipidemia  Muscle pain  Non-traumatic rhabdomyolysis      NEW MEDICATIONS STARTED DURING THIS VISIT:  New Prescriptions   No medications on file     Loleta Rose, MD 01/19/15 2344

## 2015-01-20 ENCOUNTER — Inpatient Hospital Stay: Payer: BLUE CROSS/BLUE SHIELD

## 2015-01-20 LAB — BASIC METABOLIC PANEL
Anion gap: 9 (ref 5–15)
BUN: 27 mg/dL — ABNORMAL HIGH (ref 6–20)
CALCIUM: 8.1 mg/dL — AB (ref 8.9–10.3)
CHLORIDE: 107 mmol/L (ref 101–111)
CO2: 21 mmol/L — AB (ref 22–32)
Creatinine, Ser: 2.15 mg/dL — ABNORMAL HIGH (ref 0.61–1.24)
GFR, EST AFRICAN AMERICAN: 42 mL/min — AB (ref >60)
GFR, EST NON AFRICAN AMERICAN: 36 mL/min — AB (ref >60)
GLUCOSE: 173 mg/dL — AB (ref 65–99)
POTASSIUM: 4 mmol/L (ref 3.5–5.1)
SODIUM: 137 mmol/L (ref 135–145)

## 2015-01-20 LAB — CBC
HCT: 42.7 % (ref 40.0–52.0)
Hemoglobin: 14.6 g/dL (ref 13.0–18.0)
MCH: 32.2 pg (ref 26.0–34.0)
MCHC: 34.2 g/dL (ref 32.0–36.0)
MCV: 94.3 fL (ref 80.0–100.0)
Platelets: 161 10*3/uL (ref 150–440)
RBC: 4.53 MIL/uL (ref 4.40–5.90)
RDW: 13 % (ref 11.5–14.5)
WBC: 12.3 10*3/uL — ABNORMAL HIGH (ref 3.8–10.6)

## 2015-01-20 LAB — HEMOGLOBIN A1C: Hgb A1c MFr Bld: 6 % (ref 4.0–6.0)

## 2015-01-20 MED ORDER — PREGABALIN 75 MG PO CAPS
75.0000 mg | ORAL_CAPSULE | Freq: Three times a day (TID) | ORAL | Status: DC
  Administered 2015-01-20 – 2015-01-21 (×3): 75 mg via ORAL
  Filled 2015-01-20 (×3): qty 1

## 2015-01-20 MED ORDER — ALLOPURINOL 100 MG PO TABS
200.0000 mg | ORAL_TABLET | Freq: Every day | ORAL | Status: DC
  Administered 2015-01-20 – 2015-01-21 (×2): 200 mg via ORAL
  Filled 2015-01-20 (×2): qty 2

## 2015-01-20 MED ORDER — AMLODIPINE BESYLATE 5 MG PO TABS
5.0000 mg | ORAL_TABLET | Freq: Every day | ORAL | Status: DC
  Administered 2015-01-20 – 2015-01-21 (×2): 5 mg via ORAL
  Filled 2015-01-20 (×2): qty 1

## 2015-01-20 MED ORDER — FENOFIBRATE 160 MG PO TABS
160.0000 mg | ORAL_TABLET | Freq: Every day | ORAL | Status: DC
  Administered 2015-01-20 – 2015-01-21 (×2): 160 mg via ORAL
  Filled 2015-01-20 (×2): qty 1

## 2015-01-20 MED ORDER — OXYCODONE HCL 5 MG PO TABS
5.0000 mg | ORAL_TABLET | Freq: Once | ORAL | Status: AC
  Administered 2015-01-20: 06:00:00 5 mg via ORAL
  Filled 2015-01-20: qty 1

## 2015-01-20 MED ORDER — CARVEDILOL 25 MG PO TABS
25.0000 mg | ORAL_TABLET | Freq: Two times a day (BID) | ORAL | Status: DC
  Administered 2015-01-20 – 2015-01-21 (×3): 25 mg via ORAL
  Filled 2015-01-20 (×3): qty 1

## 2015-01-20 MED ORDER — HYDROCODONE-ACETAMINOPHEN 5-325 MG PO TABS
1.0000 | ORAL_TABLET | ORAL | Status: DC | PRN
  Administered 2015-01-20 – 2015-01-21 (×4): 1 via ORAL
  Filled 2015-01-20 (×4): qty 1

## 2015-01-20 NOTE — Care Management (Signed)
Admitted to [facility] with the diagnosis of acute renal failure. Lives with wife, Tamela Oddi [phone]). Takes care of all activities of daily living himself, works and drives. States he goes to [facility] [facility] [employer] in [place]. Last seen at [facility] a few months ago. Scheduled for a renal ultrasound today. Gwenette Greet RN MSN Care Management [phone]

## 2015-01-20 NOTE — Progress Notes (Signed)
Inpatient Diabetes Program Recommendations  AACE/ADA: New Consensus Statement on Inpatient Glycemic Control (2013)  Target Ranges:  Prepandial:   less than 140 mg/dL      Peak postprandial:   less than 180 mg/dL (1-2 hours)      Critically ill patients:  140 - 180 mg/dL   Results for BAIN, WHICHARD (MRN [id number]) as of 01/20/2015 15:27  Ref. Range 01/19/2015 16:23 01/20/2015 04:38  Glucose Latest Ref Range: 65-99 mg/dL 409 (H) 811 (H)    Reason for assessment: elevated lab glucose  Diabetes history: none Outpatient Diabetes medications: none Current orders for Inpatient glycemic control: none  Elevated lab glucose.  Record indicated A1C 5.6% on 09/2013 and 5.8% on 03/2010. Family history of diabetes  Please consider ordering an A1C and checking CBG q4h  Susette Racer, RN, [place], [place], CDE Diabetes Coordinator Inpatient Diabetes Program  [phone] (Team Pager) [phone] [facility] Office) 01/20/2015 3:29 PM

## 2015-01-20 NOTE — Consult Note (Signed)
[facility] CONSULT NOTE    Date: 01/20/2015                  Patient Name:  Donald Hart  MRN: [id number]  DOB: Aug 05, 1970  Age / Sex: 44 y.o., male         PCP: No primary care provider on file.                 Service Requesting Consult: Dr. Cherlynn Kaiser                 Reason for Consult: Acute renal failure            History of Present Illness: Patient is a 44 y.o. male with a PMHx of hypertension, gout, chronic back pain, who was admitted to [facility] on 01/19/2015 for evaluation of dizziness, lightheadedness, and body aches.   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 He reports that he was sweating quite profusely well there. He attempted to keep up with his losses however he feels he did not drink enough. Complicating this the patient is noted to be on lisinopril, bupropion, and naproxen. He was found have severe acute renal failure upon presentation here. Creatinine was 4.46 upon presentation and is now down to 2.15. Renal ultrasound was performed and was unremarkable. No hydronephrosis was noted. The patient is maintaining on IV fluid hydration. NSAIDs, lisinopril were held. Urinalysis was negative for protein.   Medications: Outpatient medications: Prescriptions prior to admission  Medication Sig Dispense Refill Last Dose  . allopurinol (ZYLOPRIM) 100 MG tablet Take 200 mg by mouth daily.    01/19/2015 at Unknown time  . amLODipine (NORVASC) 5 MG tablet Take 5 mg by mouth daily.   01/19/2015 at Unknown time  . carvedilol (COREG) 25 MG tablet Take 25 mg by mouth 2 (two) times daily.   01/19/2015 at 0430  . ibuprofen (ADVIL,MOTRIN) 200 MG tablet Take 800 mg by mouth 2 (two) times daily.   01/19/2015 at Unknown time  . lisinopril (PRINIVIL,ZESTRIL) 40 MG tablet Take 40 mg by mouth daily.   01/19/2015 at Unknown time  . naproxen (NAPROSYN) 500 MG tablet Take 500 mg by mouth 2 (two) times daily as needed for mild pain.   01/19/2015 at 0430  . pregabalin  (LYRICA) 75 MG capsule Take 75 mg by mouth 3 (three) times daily.   01/19/2015 at Unknown time    Current medications: Current Facility-Administered Medications  Medication Dose Route Frequency Provider Last Rate Last Dose  . 0.9 %  sodium chloride infusion   Intravenous Continuous Shaune Pollack, MD 150 mL/hr at 01/20/15 0257    . acetaminophen (TYLENOL) tablet 650 mg  650 mg Oral Q6H PRN Shaune Pollack, MD   650 mg at 01/19/15 2011   Or  . acetaminophen (TYLENOL) suppository 650 mg  650 mg Rectal Q6H PRN Shaune Pollack, MD      . albuterol (PROVENTIL) (2.5 MG/3ML) 0.083% nebulizer solution 2.5 mg  2.5 mg Nebulization Q2H PRN Shaune Pollack, MD      . allopurinol (ZYLOPRIM) tablet 200 mg  200 mg Oral Daily Houston Siren, MD   200 mg at 01/20/15 1259  . amLODipine (NORVASC) tablet 5 mg  5 mg Oral Daily Houston Siren, MD   5 mg at 01/20/15 1259  . carvedilol (COREG) tablet 25 mg  25 mg Oral BID Houston Siren, MD   25 mg at 01/20/15 1259  . fenofibrate tablet  160 mg  160 mg Oral Daily Houston Siren, MD   160 mg at 01/20/15 1529  . folic acid (FOLVITE) tablet 1 mg  1 mg Oral Daily Shaune Pollack, MD   1 mg at 01/20/15 1011  . heparin injection 5,000 Units  5,000 Units Subcutaneous 3 times per day Shaune Pollack, MD   5,000 Units at 01/20/15 1258  . HYDROcodone-acetaminophen (NORCO/VICODIN) 5-325 MG per tablet 1 tablet  1 tablet Oral Q4H PRN Houston Siren, MD   1 tablet at 01/20/15 1259  . LORazepam (ATIVAN) tablet 1 mg  1 mg Oral Q6H PRN Shaune Pollack, MD       Or  . LORazepam (ATIVAN) injection 1 mg  1 mg Intravenous Q6H PRN Shaune Pollack, MD      . morphine 2 MG/ML injection 2 mg  2 mg Intravenous Q4H PRN Arnaldo Natal, MD   2 mg at 01/20/15 1151  . multivitamin with minerals tablet 1 tablet  1 tablet Oral Daily Shaune Pollack, MD   1 tablet at 01/20/15 1011  . nicotine (NICODERM CQ - dosed in mg/24 hours) patch 14 mg  14 mg Transdermal Daily Shaune Pollack, MD   14 mg at 01/20/15 1011  . ondansetron (ZOFRAN) tablet 4 mg  4 mg  Oral Q6H PRN Shaune Pollack, MD       Or  . ondansetron [facility]) injection 4 mg  4 mg Intravenous Q6H PRN Shaune Pollack, MD      . pneumococcal 23 valent vaccine (PNU-IMMUNE) injection 0.5 mL  0.5 mL Intramuscular Tomorrow-1000 Shaune Pollack, MD      . pregabalin (LYRICA) capsule 75 mg  75 mg Oral TID Houston Siren, MD   75 mg at 01/20/15 1259  . thiamine (VITAMIN B-1) tablet 100 mg  100 mg Oral Daily Shaune Pollack, MD   100 mg at 01/20/15 1011   Or  . thiamine (B-1) injection 100 mg  100 mg Intravenous Daily Shaune Pollack, MD          Allergies: No Known Allergies    Past Medical History: Past Medical History  Diagnosis Date  . Hypertension   . Chronic back pain   . Gout   . Family history of heart attack     Father.   . Family history of diabetes mellitus     sister  . Family history of stomach cancer      Past Surgical History: History reviewed. No pertinent past surgical history.   Family History: Family History  Problem Relation Age of Onset  . Heart attack Father   . Stomach cancer Father   . Diabetes Father   . Diabetes Sister      Social History: History   Social History  . Marital Status: Married    Spouse Name: N/A  . Number of Children: N/A  . Years of Education: N/A   Occupational History  . Not on file.   Social History Main Topics  . Smoking status: Current Every Day Smoker -- 1.00 packs/day for 20 years  . Smokeless tobacco: Not on file  . Alcohol Use: 7.2 oz/week    12 Cans of beer per week     Comment: occasionally  . Drug Use: Not on file  . Sexual Activity: Not on file   Other Topics Concern  . Not on file   Social History Narrative  . No narrative on file     Review of Systems: Review of Systems  Constitutional: Positive  for malaise/fatigue and diaphoresis. Negative for fever, chills and weight loss.  HENT: Negative for hearing loss and tinnitus.   Eyes: Negative for blurred vision and double vision.  Respiratory: Negative for cough,  hemoptysis and sputum production.   Cardiovascular: Positive for leg swelling. Negative for chest pain, palpitations and orthopnea.  Gastrointestinal: Negative for heartburn, nausea, vomiting and abdominal pain.  Genitourinary: Negative for dysuria and urgency.  Musculoskeletal: Positive for back pain.  Skin: Negative for itching and rash.  Neurological: Positive for dizziness and weakness. Negative for tingling, tremors, focal weakness and headaches.  Endo/Heme/Allergies: Negative for environmental allergies and polydipsia. Does not bruise/bleed easily.  Psychiatric/Behavioral: Negative for depression. The patient is not nervous/anxious.      Vital Signs: Blood pressure 131/75, pulse 73, temperature 97.7 F (36.5 C), temperature source Oral, resp. rate 20, height 6\' 2"  (1.88 m), weight 109.861 kg (242 lb 3.2 oz), SpO2 100 %.  Weight trends: Filed Weights   01/19/15 1546 01/19/15 1850  Weight: 109.3 kg (240 lb 15.4 oz) 109.861 kg (242 lb 3.2 oz)    Physical Exam: General: NAD, laying in bed  Head: Normocephalic, atraumatic.  Eyes: Anicteric, EOMI  Nose: Mucous membranes moist, not inflammed, nonerythematous.  Throat: Oropharynx nonerythematous, no exudate appreciated.   Neck: supple  Lungs:  Normal respiratory effort. Clear to auscultation BL without crackles or wheezes.  Heart: RRR. S1 and S2 normal without gallop, murmur, or rubs.  Abdomen:  BS normoactive. Soft, Nondistended, non-tender.  No masses or organomegaly.  Extremities: No pretibial edema.  Neurologic: A&O X3, Motor strength is 5/5 in the all 4 extremities  Skin: No visible rashes, scars.    Lab results: Basic Metabolic Panel:  Recent Labs Lab 01/19/15 1623 01/20/15 0438  NA PATIENT'S SAMPLE VERY LIPEMIC UNABLE TO REPORT 137  K 4.9 4.0  CL 95* 107  CO2 19* 21*  GLUCOSE 212* 173*  BUN 33* 27*  CREATININE 4.46* 2.15*  CALCIUM 8.9 8.1*  MG 2.3  --     Liver Function Tests:  Recent Labs Lab  01/19/15 1623  AST 43*  ALT 37  ALKPHOS 99  BILITOT 1.1  PROT  PATIENT'S SAMPLE VERY LIPEMIC UNABLE TO REPORT  ALBUMIN 4.4   No results for input(s): LIPASE, AMYLASE in the last 168 hours. No results for input(s): AMMONIA in the last 168 hours.  CBC:  Recent Labs Lab 01/19/15 1623 01/20/15 0438  WBC 20.7* 12.3*  HGB 17.0 14.6  HCT 48.9 42.7  MCV 93.8 94.3  PLT 205 161    Cardiac Enzymes:  Recent Labs Lab 01/19/15 1623  CKTOTAL 841*  TROPONINI 0.03    BNP: Invalid input(s): POCBNP  CBG: No results for input(s): GLUCAP in the last 168 hours.  Microbiology: Results for orders placed or performed in visit on 08/17/12  Culture, blood (single)     Status: None   Collection Time: 08/17/12  5:33 PM  Result Value Ref Range Status   Micro Text Report   Final       COMMENT                   NO GROWTH AEROBICALLY/ANAEROBICALLY IN 5 DAYS   ANTIBIOTIC  Culture, blood (single)     Status: None   Collection Time: 08/17/12  6:20 PM  Result Value Ref Range Status   Micro Text Report   Final       COMMENT                   NO GROWTH AEROBICALLY/ANAEROBICALLY IN 5 DAYS   ANTIBIOTIC                                                      Urine culture     Status: None   Collection Time: 08/17/12  8:37 PM  Result Value Ref Range Status   Micro Text Report   Final       SOURCE: CLEAN CATCH    COMMENT                   NO GROWTH IN 36 HOURS   ANTIBIOTIC                                                        Coagulation Studies: No results for input(s): LABPROT, INR in the last 72 hours.  Urinalysis:  Recent Labs  01/19/15 2201  COLORURINE YELLOW*  LABSPEC 1.012  PHURINE 5.0  GLUCOSEU NEGATIVE  HGBUR 2+*  BILIRUBINUR NEGATIVE  KETONESUR NEGATIVE  PROTEINUR 30*  NITRITE NEGATIVE  LEUKOCYTESUR NEGATIVE      Imaging: US Renal  01/20/2015   CLINICAL DATA:  Acute renal failure  EXAM: RENAL / URINARY  TRACT ULTRASOUND COMPLETE  COMPARISON:  CT abdomen pelvis dated 08/17/2012  FINDINGS: Right Kidney:  Length: 12.6 cm.  No mass or hydronephrosis.  Left Kidney:  Length: 10.6 cm.  Lobular cortex.  No mass or hydronephrosis.  Bladder:  Within normal limits.  IMPRESSION: Negative renal ultrasound.   Electronically Signed   By: Charline Bills M.D.   On: 01/20/2015 10:13      Assessment & Plan: Pt is a 44 y.o. yo male with a PMHX of hypertension, gout, ETOH abuse, back pain, was admitted to [facility] on 01/19/2015 with dizziness, lightheadedness and found to have ARF.   1. Acute renal failure. This is most likely secondary to volume loss while on lisinopril, ibuprofen, and naproxen. It appears that renal function is improving rapidly with IV fluid hydration. We would continue 0.9 normal saline at its current rate. Renal ultrasound was unremarkable. No additional workup to be ordered thus far.  2. Hypertension.  Continue amlodipine, Coreg.  3. Rhabdomyolysis. CK noted to be slightly high. Dehydration likely contributing to this. Continue IV fluid hydration with 0.9 normal saline.  4. Thanks for consult.

## 2015-01-20 NOTE — Progress Notes (Signed)
Initial Nutrition Assessment    INTERVENTION:   Meals and snacks: Recommend heart healthy diet secondary to lipid profile   NUTRITION DIAGNOSIS:   Altered nutrition lab value related to chronic illness as evidenced by  (elevated cholesterol, TG and low HDL).    GOAL:   Patient will meet greater than or equal to 90% of their needs    MONITOR:    (Energy intake,electrolyte and renal profile)  REASON FOR ASSESSMENT:   Diagnosis    ASSESSMENT:   Pt admitted with ARF, back pain, chest pain, dizziness, weakness  Past Medical History  Diagnosis Date  . Hypertension   . Chronic back pain   . Gout   . Family history of heart attack     Father.   . Family history of diabetes mellitus     sister  . Family history of stomach cancer     Current Nutrition: ate 100% of breakfast this am, tray observed. Pt out of room this am during rounds  Food/Nutrition-Related History: unsure intake prior to admission. Noted per MST no issues with appetite prior to admission   Medications: NS at 163ml/hr, folic acid, MVI, thiamine  Electrolyte/Renal Profile and Glucose Profile:   Recent Labs Lab 01/19/15 1623 01/20/15 0438  NA PATIENT'S SAMPLE VERY LIPEMIC UNABLE TO REPORT 137  K 4.9 4.0  CL 95* 107  CO2 19* 21*  BUN 33* 27*  CREATININE 4.46* 2.15*  CALCIUM 8.9 8.1*  MG 2.3  --   GLUCOSE 212* 173*   Protein Profile:  Recent Labs Lab 01/19/15 1623  ALBUMIN 4.4   Lipid Profile:     Component Value Date/Time   CHOL 230* 01/19/2015 1623   TRIG 1104* 01/19/2015 1623   HDL 33* 01/19/2015 1623   CHOLHDL 7.0 01/19/2015 1623   VLDL UNABLE TO CALCULATE IF TRIGLYCERIDE OVER 400 mg/dL 16/04/9603 5409   LDLCALC UNABLE TO CALCULATE IF TRIGLYCERIDE OVER 400 mg/dL 81/19/1478 2956     Last BM:7/21   Nutrition-Focused Physical Exam Findings:  Unable to complete Nutrition-Focused physical exam at this time.     Weight Change:no weight loss noted per MST screening  tool   Diet Order:  Diet regular Room service appropriate?: Yes; Fluid consistency:: Thin  Skin:  Reviewed, no issues   Height:   Ht Readings from Last 1 Encounters:  01/19/15  (1.88 m)    Weight:   Wt Readings from Last 1 Encounters:  01/19/15 242 lb 3.2 oz (109.861 kg)      Wt Readings from Last 10 Encounters:  01/19/15 242 lb 3.2 oz (109.861 kg)    BMI:  Body mass index is 31.08 kg/(m^2).   EDUCATION NEEDS:   No education needs identified at this time   LOW Care Level  Quentin Shorey B. Freida Busman, RD, LDN [phone] (pager)

## 2015-01-20 NOTE — Progress Notes (Signed)
[facility] - [place] [facility]   PATIENT NAME: Donald Hart    MR#:  [id number]  DATE OF BIRTH:  04-21-1971  SUBJECTIVE:  CHIEF COMPLAINT:   Chief Complaint  Patient presents with  . Back Pain  . Chest Pain   Patient here due to the back pain and also noted to be in acute renal failure. Creatinine has improved with IV fluid hydration. Family at bedside  REVIEW OF SYSTEMS:    Review of Systems  Constitutional: Negative for fever and chills.  HENT: Negative for congestion and tinnitus.   Eyes: Negative for blurred vision and double vision.  Respiratory: Negative for cough, shortness of breath and wheezing.   Cardiovascular: Negative for chest pain, orthopnea and PND.  Gastrointestinal: Negative for nausea, vomiting, abdominal pain and diarrhea.  Genitourinary: Negative for dysuria and hematuria.  Musculoskeletal: Positive for back pain.  Neurological: Negative for dizziness, sensory change and focal weakness.  All other systems reviewed and are negative.   Nutrition: Regular Tolerating Diet: Yes Tolerating PT:  Ambulatory  DRUG ALLERGIES:  No Known Allergies  VITALS:  Blood pressure 131/75, pulse 73, temperature 97.7 F (36.5 C), temperature source Oral, resp. rate 20, height 6\' 2"  (1.88 m), weight 109.861 kg (242 lb 3.2 oz), SpO2 100 %.  PHYSICAL EXAMINATION:   Physical Exam  GENERAL:  44 y.o.-year-old patient lying in the bed with no acute distress.  EYES: Pupils equal, round, [name], normocephalic. Oropharynx and nasopharynx clear.  NECK:  Supple, [name], [name], no wheezing, rales, rhonchi. No use of accessory muscles of respiration.  CARDIOVASCULAR: S1, [name], rubs, [name], nontender, nondistended.  Bowel sounds present. No organomegaly or mass.  EXTREMITIES: No cyanosis, clubbing or edema b/l.    NEUROLOGIC: Cranial nerves II through XII are intact. No focal Motor or sensory deficits b/l.   PSYCHIATRIC: The patient is alert and oriented x 3. Good affect  SKIN: No obvious rash, lesion, or ulcer.    LABORATORY PANEL:   CBC  Recent Labs Lab 01/20/15 0438  WBC 12.3*  HGB 14.6  HCT 42.7  PLT 161   ------------------------------------------------------------------------------------------------------------------  Chemistries   Recent Labs Lab 01/19/15 1623 01/20/15 0438  NA PATIENT'S SAMPLE VERY LIPEMIC UNABLE TO REPORT 137  K 4.9 4.0  CL 95* 107  CO2 19* 21*  GLUCOSE 212* 173*  BUN 33* 27*  CREATININE 4.46* 2.15*  CALCIUM 8.9 8.1*  MG 2.3  --   AST 43*  --   ALT 37  --   ALKPHOS 99  --   BILITOT 1.1  --    ------------------------------------------------------------------------------------------------------------------  Cardiac Enzymes  Recent Labs Lab 01/19/15 1623  TROPONINI 0.03   ------------------------------------------------------------------------------------------------------------------  RADIOLOGY:  US Renal  01/20/2015   CLINICAL DATA:  Acute renal failure  EXAM: RENAL / URINARY TRACT ULTRASOUND COMPLETE  COMPARISON:  CT abdomen pelvis dated 08/17/2012  FINDINGS: Right Kidney:  Length: 12.6 cm.  No mass or hydronephrosis.  Left Kidney:  Length: 10.6 cm.  Lobular cortex.  No mass or hydronephrosis.  Bladder:  Within normal limits.  IMPRESSION: Negative renal ultrasound.   Electronically Signed   By: Charline Bills M.D.   On: 01/20/2015 10:13     ASSESSMENT AND PLAN:   44 year old male with past medical history of hypertension, alcohol abuse,  neuropathy, chronic pain, history of gout, who presented to the hospital due to back pain and noted to be in acute renal failure.  #1 acute renal failure-this is likely secondary to dehydration and poor  by mouth intake with ongoing alcohol abuse. -Continue IV fluids and creatinine has improved. Renal ultrasound showing no evidence of hydronephrosis. -Seen by nephrology and continue current care. Hold nephrotoxins like lisinopril, Naproxen for now.    #2 hypertriglyceridemia-this is likely secondary to his ongoing alcohol abuse. -We'll start on TriCor.  #3 alcohol abuse-patient is high risk for alcohol withdrawal. Slight-continue CIWA protocol.  #4 gout-no acute attack continue allopurinol.  #5 hypertension-hemodynamically stable. Continue Coreg, Norvasc.  #6 neuropathy/chronic pain-continue Dulera, when necessary hydrocodone  #7 tobacco abuse-continue nicotine patch.    All the records are reviewed and case discussed with Care Management/Social Workerr. Management plans discussed with the patient, family and they are in agreement.  CODE STATUS: Full  DVT Prophylaxis: Heparin subcutaneous  TOTAL TIME TAKING CARE OF THIS PATIENT: 30 minutes.   POSSIBLE D/C IN 1-2 DAYS, DEPENDING ON CLINICAL CONDITION.   Houston Siren M.D on 01/20/2015 at 2:41 PM  Between 7am to 6pm - Pager - [phone]  After 6pm go to [email or web address] - password [email or web address] [facility]  [facility]  Office  [phone]  CC: Primary care physician; No primary care provider on file.

## 2015-01-20 NOTE — Progress Notes (Signed)
Notified Dr Betti Cruz that pt c/o back/neck pain. Pain level 10. Pt refuses morphine, verbalized it does not help. Pt requsted oxycodone. MD verbalized to order oxycodone  once.

## 2015-01-20 NOTE — Plan of Care (Addendum)
Problem: Discharge Progression Outcomes Goal: Other Discharge Outcomes/Goals Outcome: Progressing Plan of care progress to goals: BP slightly low. VSS. C/o back/neck pain, pain level 10. Dr Imogene Burn notified, verbalized to give tylenol. Tylenol given, pt slept on reassessment. Pt c/o pain, Dr Sheryle Hail notified, morphine ordered but not given until 0055 due to pt been asleep. This am pt c/o pain, refused morphine. Dr Betti Cruz notified, oxycodone ordered once with improvement.  Tolerated dinner. Denies nausea. Creatinine/BUN improved. IVF infusing. No alcohol withdrawal symptoms. Continue CIWA qx6hrs.

## 2015-01-21 LAB — BASIC METABOLIC PANEL
Anion gap: 5 (ref 5–15)
BUN: 13 mg/dL (ref 6–20)
CO2: 24 mmol/L (ref 22–32)
Calcium: 8.3 mg/dL — ABNORMAL LOW (ref 8.9–10.3)
Chloride: 110 mmol/L (ref 101–111)
Creatinine, Ser: 0.72 mg/dL (ref 0.61–1.24)
GFR calc Af Amer: >60 mL/min (ref >60)
Glucose, Bld: 139 mg/dL — ABNORMAL HIGH (ref 65–99)
Potassium: 4.8 mmol/L (ref 3.5–5.1)
SODIUM: 139 mmol/L (ref 135–145)

## 2015-01-21 MED ORDER — FENOFIBRATE 160 MG PO TABS: 160.0000 mg | ORAL_TABLET | Freq: Every day | ORAL | Status: DC

## 2015-01-21 MED ORDER — SIMVASTATIN 40 MG PO TABS: 40.0000 mg | ORAL_TABLET | Freq: Every day | ORAL | Status: DC

## 2015-01-21 NOTE — Discharge Instructions (Signed)
DIET:  Cardiac diet  DISCHARGE CONDITION:  Stable  ACTIVITY:  Activity as tolerated  OXYGEN:  Home Oxygen: No.   Oxygen Delivery: room air  DISCHARGE LOCATION:  home   If you experience worsening of your admission symptoms, develop shortness of breath, life threatening emergency, suicidal or homicidal thoughts you must seek medical attention immediately by calling 911 or calling your MD immediately  if symptoms less severe.  You Must read complete instructions/literature along with all the possible adverse reactions/side effects for all the Medicines you take and that have been prescribed to you. Take any new Medicines after you have completely understood and accpet all the possible adverse reactions/side effects.   Please note  You were cared for by a hospitalist during your hospital stay. If you have any questions about your discharge medications or the care you received while you were in the hospital after you are discharged, you can call the unit and asked to speak with the hospitalist on call if the hospitalist that took care of you is not available. Once you are discharged, your primary care physician will handle any further medical issues. Please note that NO REFILLS for any discharge medications will be authorized once you are discharged, as it is imperative that you return to your primary care physician (or establish a relationship with a primary care physician if you do not have one) for your aftercare needs so that they can reassess your need for medications and monitor your lab values.    Acute Kidney Injury Acute kidney injury is a disease in which there is sudden (acute) damage to the kidneys. The kidneys are 2 organs that lie on either side of the spine between the middle of the back and the front of the abdomen. The kidneys:  Remove wastes and extra water from the blood.   Produce important hormones. These help keep bones strong, regulate blood pressure, and help  create red blood cells.   Balance the fluids and chemicals in the blood and tissues. A small amount of kidney damage may not cause problems, but a large amount of damage may make it difficult or impossible for the kidneys to work the way they should. Acute kidney injury may develop into long-lasting (chronic) kidney disease. It may also develop into a life-threatening disease called end-stage kidney disease. Acute kidney injury can get worse very quickly, so it should be treated right away. Early treatment may prevent other kidney diseases from developing.  CAUSES   A problem with blood flow to the kidneys. This may be caused by:   Blood loss.   Heart disease.   Severe burns.   Liver disease.  Direct damage to the kidneys. This may be caused by:  Some medicines.   A kidney infection.   Poisoning or consuming toxic substances.   A surgical wound.   A blow to the kidney area.   A problem with urine flow. This may be caused by:   Cancer.   Kidney stones.   An enlarged prostate. SYMPTOMS   Swelling (edema) of the legs, ankles, or feet.   Tiredness (lethargy).   Nausea or vomiting.   Confusion.   Problems with urination, such as:   Painful or burning feeling during urination.   Decreased urine production.   Frequent accidents in children who are potty trained.   Bloody urine.   Muscle twitches and cramps.   [name].   Seizures.   Chest pain or pressure. Sometimes, no symptoms are  present. DIAGNOSIS Acute kidney injury may be detected and diagnosed by tests, including blood, urine, imaging, or kidney biopsy tests.  TREATMENT Treatment of acute kidney injury varies depending on the cause and severity of the kidney damage. In mild cases, no treatment may be needed. The kidneys may heal on their own. If acute kidney injury is more severe, your caregiver will treat the cause of the kidney damage, help the kidneys heal, and  prevent complications from occurring. Severe cases may require a procedure to remove toxic wastes from the body (dialysis) or surgery to repair kidney damage. Surgery may involve:   Repair of a torn kidney.   Removal of an obstruction. Most of the time, you will need to stay overnight at the hospital.  HOME CARE INSTRUCTIONS:  Follow your prescribed diet.  Only take over-the-counter or prescription medicines as directed by your caregiver.  Do not take any new medicines (prescription, over-the-counter, or nutritional supplements) unless approved by your caregiver. Many medicines can worsen your kidney damage or need to have the dose adjusted.   Keep all follow-up appointments as directed by your caregiver.  Observe your condition to make sure you are healing as expected. SEEK IMMEDIATE MEDICAL CARE IF:  You are feeling ill or have severe pain in the back or side.   Your symptoms return or you have new symptoms.  You have any symptoms of end-stage kidney disease. These include:   Persistent itchiness.   Loss of appetite.   Headaches.   Abnormally dark or light skin.  Numbness in the hands or feet.   Easy bruising.   Frequent hiccups.   Menstruation stops.   You have a fever.  You have increased urine production.  You have pain or bleeding when urinating. MAKE SURE YOU:   Understand these instructions.  Will watch your condition.  Will get help right away if you are not doing well or get worse Document Released: 12/31/2010 Document Revised: 10/12/2012 Document Reviewed: 02/14/2012 [facility] Patient Information 2015 [place], [place]. This information is not intended to replace advice given to you by your health care provider. Make sure you discuss any questions you have with your health care provider.

## 2015-01-21 NOTE — Progress Notes (Signed)
MD order received to discharge pt home today; verbally reviewed [facility] AVS instructions with pt including medications/gave Rxs to pt; diet; activity level and follow up appointment/pt to call primary care physician on Monday, 01/23/15 for 1 week appointment; pt refused pneumonia injection; pt verbalized understanding with no questions voiced at this time; pt discharged via wheelchair by nursing to the visitor's entrance

## 2015-01-21 NOTE — Plan of Care (Addendum)
Problem: Discharge Progression Outcomes Goal: Other Discharge Outcomes/Goals Outcome: Progressing Plan of care progress to goals: VSS. C/o lower back painx2, morphine and hydrocodone given with improvement. Denies N/V. IVF infusing. BMP pending. No alcohol withdrawal symptoms noted. Possible discharge today.

## 2015-01-21 NOTE — Discharge Summary (Signed)
[facility] - [place] at [facility]   PATIENT NAME: Donald Hart    MR#:  [id number]  DATE OF BIRTH:  [birth year]  DATE OF ADMISSION:  01/19/2015 ADMITTING PHYSICIAN: Shaune Pollack, MD  DATE OF DISCHARGE: 01/21/2015 10:10 AM  PRIMARY CARE PHYSICIAN: [name], MD    ADMISSION DIAGNOSIS:  Muscle pain [M79.1] Hyperlipidemia [E78.5] Non-traumatic rhabdomyolysis [M62.82] Acute renal failure, unspecified acute renal failure type [N17.9]  DISCHARGE DIAGNOSIS:  Principal Problem:   ARF (acute renal failure) Active Problems:   Leukocytosis   SECONDARY DIAGNOSIS:   Past Medical History  Diagnosis Date  . Hypertension   . Chronic back pain   . Gout   . Family history of heart attack     Father.   . Family history of diabetes mellitus     sister  . Family history of stomach cancer     HOSPITAL COURSE:   44 year old male with past medical history of hypertension, alcohol abuse, neuropathy, chronic pain, history of gout, who presented to the hospital due to back pain and noted to be in acute renal failure.  #1 acute renal failure-this is likely secondary to dehydration and poor by mouth intake with ongoing alcohol abuse. -Patient was aggressively hydrated with IV fluids and his creatinine has improved and back to baseline. -Patient was seen by nephrology who agreed with this management. Patient had a renal ultrasound which showed no evidence of hydronephrosis. Patient's lisinopril and naproxen were held although since the patient's renal function is back to baseline he can resume those now.  #2 hypertriglyceridemia/hyperlipidemia-this is likely secondary to his ongoing alcohol abuse. -Patient is being discharged on low-dose simvastatin and TriCor. He likely should have a repeat lipid profile within 3 months along with his LFTs. Patient was strongly advised to abstain from alcohol.  #3 alcohol abuse-patient is high risk for alcohol withdrawal.   -While in the hospital patient was on CIWA protocol but did not have any evidence of acute withdrawal  #4 gout-patient had no acute attack and he'll continue his allopurinol.  #5 hypertension-hemodynamically stable. Continue Coreg, Norvasc.  #6 neuropathy/chronic pain-patient will continue his Lyrica, naproxen as needed.  #7 tobacco abuse-while in the hospital patient was maintained on nicotine patch.  DISCHARGE CONDITIONS:   Stable  CONSULTS OBTAINED:  Treatment Team:  Munsoor Cherylann Ratel, MD  DRUG ALLERGIES:  No Known Allergies  DISCHARGE MEDICATIONS:   Discharge Medication List as of 01/21/2015  9:34 AM    START taking these medications   Details  fenofibrate 160 MG tablet Take 1 tablet (160 mg total) by mouth daily., Starting 01/21/2015, Until Discontinued, Print    simvastatin (ZOCOR) 40 MG tablet Take 1 tablet (40 mg total) by mouth daily., Starting 01/21/2015, Until Discontinued, Print      CONTINUE these medications which have NOT CHANGED   Details  allopurinol (ZYLOPRIM) 100 MG tablet Take 200 mg by mouth daily. , Until Discontinued, Historical Med    amLODipine (NORVASC) 5 MG tablet Take 5 mg by mouth daily., Until Discontinued, Historical Med    carvedilol (COREG) 25 MG tablet Take 25 mg by mouth 2 (two) times daily., Until Discontinued, Historical Med    ibuprofen (ADVIL,MOTRIN) 200 MG tablet Take 800 mg by mouth 2 (two) times daily., Until Discontinued, Historical Med    lisinopril (PRINIVIL,ZESTRIL) 40 MG tablet Take 40 mg by mouth daily., Until Discontinued, Historical Med    naproxen (NAPROSYN) 500 MG tablet Take 500 mg by mouth 2 (two) times daily  as needed for mild pain., Until Discontinued, Historical Med    pregabalin (LYRICA) 75 MG capsule Take 75 mg by mouth 3 (three) times daily., Until Discontinued, Historical Med         DISCHARGE INSTRUCTIONS:   DIET:  Cardiac diet  DISCHARGE CONDITION:  Stable  ACTIVITY:  Activity as  tolerated  OXYGEN:  Home Oxygen: No.   Oxygen Delivery: room air  DISCHARGE LOCATION:  home   If you experience worsening of your admission symptoms, develop shortness of breath, life threatening emergency, suicidal or homicidal thoughts you must seek medical attention immediately by calling 911 or calling your MD immediately  if symptoms less severe.  You Must read complete instructions/literature along with all the possible adverse reactions/side effects for all the Medicines you take and that have been prescribed to you. Take any new Medicines after you have completely understood and accpet all the possible adverse reactions/side effects.   Please note  You were cared for by a hospitalist during your hospital stay. If you have any questions about your discharge medications or the care you received while you were in the hospital after you are discharged, you can call the unit and asked to speak with the hospitalist on call if the hospitalist that took care of you is not available. Once you are discharged, your primary care physician will handle any further medical issues. Please note that NO REFILLS for any discharge medications will be authorized once you are discharged, as it is imperative that you return to your primary care physician (or establish a relationship with a primary care physician if you do not have one) for your aftercare needs so that they can reassess your need for medications and monitor your lab values.     Today   Still has some back pain which is chronic in nature. Renal function back to baseline. No evidence of withdrawal.  VITAL SIGNS:  Blood pressure 109/74, pulse 74, temperature 97.5 F (36.4 C), temperature source Oral, resp. rate 18, height  (1.88 m), weight 109.861 kg (242 lb 3.2 oz), SpO2 100 %.  I/O:    Intake/Output Summary (Last 24 hours) at 01/21/15 1450 Last data filed at 01/21/15 0942  Gross per 24 hour  Intake 3812.5 ml  Output    950 ml   Net 2862.5 ml    PHYSICAL EXAMINATION:  GENERAL:  44 y.o.-year-old patient lying in the bed with no acute distress.  EYES: Pupils equal, round, [name], normocephalic. Oropharynx and nasopharynx clear.  NECK:  Supple, [name], [name], no wheezing, rales,rhonchi. No use of accessory muscles of respiration.  CARDIOVASCULAR: S1, [name], rubs, [name], non-tender, non-distended. Bowel sounds present. No organomegaly or mass.  EXTREMITIES: No pedal edema, cyanosis, or clubbing.  NEUROLOGIC: Cranial nerves II through XII are intact. No focal motor or sensory defecits b/l.  PSYCHIATRIC: The patient is alert and oriented x 3. Good affect.  SKIN: No obvious rash, lesion, or ulcer.   DATA REVIEW:   CBC  Recent Labs Lab 01/20/15 0438  WBC 12.3*  HGB 14.6  HCT 42.7  PLT 161    Chemistries   Recent Labs Lab 01/19/15 1623  01/21/15 0431  NA PATIENT'S SAMPLE VERY LIPEMIC UNABLE TO REPORT  < > 139  K 4.9  < > 4.8  CL  95*  < > 110  CO2 19*  < > 24  GLUCOSE 212*  < > 139*  BUN 33*  < > 13  CREATININE 4.46*  < > 0.72  CALCIUM 8.9  < > 8.3*  MG 2.3  --   --   AST 43*  --   --   ALT 37  --   --   ALKPHOS 99  --   --   BILITOT 1.1  --   --   < > = values in this interval not displayed.  Cardiac Enzymes  Recent Labs Lab 01/19/15 1623  TROPONINI 0.03     RADIOLOGY:  US Renal  01/20/2015   CLINICAL DATA:  Acute renal failure  EXAM: RENAL / URINARY TRACT ULTRASOUND COMPLETE  COMPARISON:  CT abdomen pelvis dated 08/17/2012  FINDINGS: Right Kidney:  Length: 12.6 cm.  No mass or hydronephrosis.  Left Kidney:  Length: 10.6 cm.  Lobular cortex.  No mass or hydronephrosis.  Bladder:  Within normal limits.  IMPRESSION: Negative renal ultrasound.   Electronically  Signed   By: Charline Bills M.D.   On: 01/20/2015 10:13      Management plans discussed with the patient, family and they are in agreement.  CODE STATUS:   TOTAL TIME TAKING CARE OF THIS PATIENT: 40 minutes.    Houston Siren M.D on 01/21/2015 at 2:50 PM  Between 7am to 6pm - Pager - [phone]  After 6pm go to [email or web address] - password [email or web address] [facility]  [facility]  Office  [phone]  CC: Primary care physician; [name], MD

## 2015-06-01 DIAGNOSIS — Z794 Long term (current) use of insulin: Secondary | ICD-10-CM

## 2015-06-01 DIAGNOSIS — IMO0001 Reserved for inherently not codable concepts without codable children: Secondary | ICD-10-CM

## 2015-06-01 DIAGNOSIS — E119 Type 2 diabetes mellitus without complications: Secondary | ICD-10-CM

## 2015-07-02 DIAGNOSIS — K859 Acute pancreatitis without necrosis or infection, unspecified: Secondary | ICD-10-CM

## 2015-07-02 DIAGNOSIS — E119 Type 2 diabetes mellitus without complications: Secondary | ICD-10-CM

## 2015-07-02 HISTORY — DX: Acute pancreatitis without necrosis or infection, unspecified: K85.90

## 2015-07-02 HISTORY — DX: Type 2 diabetes mellitus without complications: E11.9

## 2015-07-03 ENCOUNTER — Inpatient Hospital Stay
Admission: EM | Admit: 2015-07-03 | Discharge: 2015-07-09 | DRG: 440 | Disposition: A | Discharge status: Home / Self Care | Payer: BLUE CROSS/BLUE SHIELD | Attending: Internal Medicine | Admitting: Internal Medicine

## 2015-07-03 ENCOUNTER — Encounter: Payer: Self-pay | Admitting: Emergency Medicine

## 2015-07-03 ENCOUNTER — Emergency Department: Payer: BLUE CROSS/BLUE SHIELD

## 2015-07-03 DIAGNOSIS — R52 Pain, unspecified: Secondary | ICD-10-CM

## 2015-07-03 DIAGNOSIS — E669 Obesity, unspecified: Secondary | ICD-10-CM | POA: Diagnosis present

## 2015-07-03 DIAGNOSIS — Z794 Long term (current) use of insulin: Secondary | ICD-10-CM

## 2015-07-03 DIAGNOSIS — K859 Acute pancreatitis without necrosis or infection, unspecified: Secondary | ICD-10-CM | POA: Diagnosis present

## 2015-07-03 DIAGNOSIS — K760 Fatty (change of) liver, not elsewhere classified: Secondary | ICD-10-CM | POA: Diagnosis present

## 2015-07-03 DIAGNOSIS — E119 Type 2 diabetes mellitus without complications: Secondary | ICD-10-CM | POA: Diagnosis present

## 2015-07-03 DIAGNOSIS — R000 Tachycardia, unspecified: Secondary | ICD-10-CM | POA: Diagnosis present

## 2015-07-03 DIAGNOSIS — K4020 Bilateral inguinal hernia, without obstruction or gangrene, not specified as recurrent: Secondary | ICD-10-CM | POA: Diagnosis present

## 2015-07-03 DIAGNOSIS — F17210 Nicotine dependence, cigarettes, uncomplicated: Secondary | ICD-10-CM | POA: Diagnosis present

## 2015-07-03 DIAGNOSIS — Z6832 Body mass index (BMI) 32.0-32.9, adult: Secondary | ICD-10-CM

## 2015-07-03 DIAGNOSIS — R140 Abdominal distension (gaseous): Secondary | ICD-10-CM

## 2015-07-03 DIAGNOSIS — K860 Alcohol-induced chronic pancreatitis: Secondary | ICD-10-CM | POA: Diagnosis present

## 2015-07-03 DIAGNOSIS — K852 Alcohol induced acute pancreatitis without necrosis or infection: Principal | ICD-10-CM | POA: Diagnosis present

## 2015-07-03 DIAGNOSIS — D72829 Elevated white blood cell count, unspecified: Secondary | ICD-10-CM | POA: Diagnosis present

## 2015-07-03 DIAGNOSIS — IMO0001 Reserved for inherently not codable concepts without codable children: Secondary | ICD-10-CM

## 2015-07-03 DIAGNOSIS — F1020 Alcohol dependence, uncomplicated: Secondary | ICD-10-CM | POA: Diagnosis present

## 2015-07-03 DIAGNOSIS — K2970 Gastritis, unspecified, without bleeding: Secondary | ICD-10-CM | POA: Diagnosis present

## 2015-07-03 DIAGNOSIS — I10 Essential (primary) hypertension: Secondary | ICD-10-CM | POA: Diagnosis present

## 2015-07-03 DIAGNOSIS — E876 Hypokalemia: Secondary | ICD-10-CM | POA: Diagnosis present

## 2015-07-03 DIAGNOSIS — I700 Atherosclerosis of aorta: Secondary | ICD-10-CM | POA: Diagnosis present

## 2015-07-03 DIAGNOSIS — E781 Pure hyperglyceridemia: Secondary | ICD-10-CM | POA: Diagnosis present

## 2015-07-03 DIAGNOSIS — M109 Gout, unspecified: Secondary | ICD-10-CM | POA: Diagnosis present

## 2015-07-03 DIAGNOSIS — R109 Unspecified abdominal pain: Secondary | ICD-10-CM

## 2015-07-03 DIAGNOSIS — F1010 Alcohol abuse, uncomplicated: Secondary | ICD-10-CM | POA: Diagnosis present

## 2015-07-03 DIAGNOSIS — F439 Reaction to severe stress, unspecified: Secondary | ICD-10-CM | POA: Diagnosis present

## 2015-07-03 DIAGNOSIS — E785 Hyperlipidemia, unspecified: Secondary | ICD-10-CM

## 2015-07-03 HISTORY — DX: Type 2 diabetes mellitus without complications: E11.9

## 2015-07-03 HISTORY — DX: Acute pancreatitis without necrosis or infection, unspecified: K85.90

## 2015-07-03 HISTORY — DX: Alcohol abuse, uncomplicated: F10.10

## 2015-07-03 HISTORY — DX: Pure hyperglyceridemia: E78.1

## 2015-07-03 LAB — COMPREHENSIVE METABOLIC PANEL
ALK PHOS: 105 U/L (ref 38–126)
ALT: UNDETERMINED U/L (ref 17–63)
AST: 27 U/L (ref 15–41)
Albumin: UNDETERMINED g/dL (ref 3.5–5.0)
BUN: 9 mg/dL (ref 6–20)
CALCIUM: UNDETERMINED mg/dL (ref 8.9–10.3)
CO2: UNDETERMINED mmol/L (ref 22–32)
Chloride: 90 mmol/L — ABNORMAL LOW (ref 101–111)
Creatinine, Ser: 0.6 mg/dL — ABNORMAL LOW (ref 0.61–1.24)
GFR calc non Af Amer: >60 mL/min (ref >60)
Glucose, Bld: UNDETERMINED mg/dL (ref 65–99)
POTASSIUM: UNDETERMINED mmol/L (ref 3.5–5.1)
SODIUM: UNDETERMINED mmol/L (ref 135–145)
TOTAL PROTEIN: UNDETERMINED g/dL (ref 6.5–8.1)
Total Bilirubin: 2.7 mg/dL — ABNORMAL HIGH (ref 0.3–1.2)

## 2015-07-03 LAB — LIPID PANEL
Cholesterol: 558 mg/dL — ABNORMAL HIGH (ref 0–200)
LDL CALC: UNDETERMINED mg/dL (ref 0–99)
Triglycerides: 4037 mg/dL — ABNORMAL HIGH (ref <150)
VLDL: UNDETERMINED mg/dL (ref 0–40)

## 2015-07-03 LAB — GLUCOSE, CAPILLARY
GLUCOSE-CAPILLARY: 249 mg/dL — AB (ref 65–99)
GLUCOSE-CAPILLARY: 230 mg/dL — AB (ref 65–99)
GLUCOSE-CAPILLARY: 216 mg/dL — AB (ref 65–99)
Glucose-Capillary: 136 mg/dL — ABNORMAL HIGH (ref 65–99)
Glucose-Capillary: 161 mg/dL — ABNORMAL HIGH (ref 65–99)
Glucose-Capillary: 213 mg/dL — ABNORMAL HIGH (ref 65–99)
Glucose-Capillary: 380 mg/dL — ABNORMAL HIGH (ref 65–99)

## 2015-07-03 LAB — CBC
HEMATOCRIT: 44.3 % (ref 40.0–52.0)
HEMOGLOBIN: 15.6 g/dL (ref 13.0–18.0)
MCH: 32.9 pg (ref 26.0–34.0)
MCHC: 35.1 g/dL (ref 32.0–36.0)
MCV: 93.6 fL (ref 80.0–100.0)
Platelets: 207 10*3/uL (ref 150–440)
RBC: 4.73 MIL/uL (ref 4.40–5.90)
RDW: 12.2 % (ref 11.5–14.5)
WBC: 26.5 10*3/uL — ABNORMAL HIGH (ref 3.8–10.6)

## 2015-07-03 LAB — URINALYSIS COMPLETE WITH MICROSCOPIC (ARMC ONLY)
Bacteria, UA: NONE SEEN
Bilirubin Urine: NEGATIVE
Glucose, UA: >500 mg/dL — AB
Leukocytes, UA: NEGATIVE
NITRITE: NEGATIVE
PH: 5 (ref 5.0–8.0)
PROTEIN: NEGATIVE mg/dL
SPECIFIC GRAVITY, URINE: 1.031 — AB (ref 1.005–1.030)

## 2015-07-03 LAB — HEMOGLOBIN A1C: HEMOGLOBIN A1C: 10.6 % — AB (ref 4.0–6.0)

## 2015-07-03 LAB — LACTIC ACID, PLASMA
Lactic Acid, Venous: 1.6 mmol/L (ref 0.5–2.0)
Lactic Acid, Venous: 1.4 mmol/L (ref 0.5–2.0)

## 2015-07-03 LAB — LIPASE, BLOOD: LIPASE: UNDETERMINED U/L (ref 11–51)

## 2015-07-03 LAB — MRSA PCR SCREENING: MRSA BY PCR: NEGATIVE

## 2015-07-03 MED ORDER — PIPERACILLIN-TAZOBACTAM 3.375 G IVPB
3.3750 g | Freq: Three times a day (TID) | INTRAVENOUS | Status: DC
  Administered 2015-07-03 – 2015-07-05 (×6): 3.375 g via INTRAVENOUS
  Filled 2015-07-03 (×7): qty 50

## 2015-07-03 MED ORDER — SODIUM CHLORIDE 0.9 % IV BOLUS (SEPSIS)
1000.0000 mL | Freq: Once | INTRAVENOUS | Status: AC
  Administered 2015-07-03: 1000 mL via INTRAVENOUS

## 2015-07-03 MED ORDER — MORPHINE SULFATE (PF) 2 MG/ML IV SOLN
2.0000 mg | INTRAVENOUS | Status: DC | PRN
  Administered 2015-07-04 (×3): 2 mg via INTRAVENOUS
  Filled 2015-07-03 (×3): qty 1

## 2015-07-03 MED ORDER — ADULT MULTIVITAMIN W/MINERALS CH
1.0000 | ORAL_TABLET | Freq: Every day | ORAL | Status: DC
  Administered 2015-07-03 – 2015-07-09 (×7): 1 via ORAL
  Filled 2015-07-03 (×7): qty 1

## 2015-07-03 MED ORDER — ALLOPURINOL 100 MG PO TABS
200.0000 mg | ORAL_TABLET | Freq: Every day | ORAL | Status: DC
  Administered 2015-07-04 – 2015-07-09 (×6): 200 mg via ORAL
  Filled 2015-07-03 (×6): qty 2

## 2015-07-03 MED ORDER — HEPARIN SODIUM (PORCINE) 5000 UNIT/ML IJ SOLN
5000.0000 [IU] | Freq: Three times a day (TID) | INTRAMUSCULAR | Status: DC
  Administered 2015-07-03 – 2015-07-05 (×6): 5000 [IU] via SUBCUTANEOUS
  Filled 2015-07-03 (×7): qty 1

## 2015-07-03 MED ORDER — ACETAMINOPHEN 325 MG PO TABS
650.0000 mg | ORAL_TABLET | Freq: Four times a day (QID) | ORAL | Status: DC | PRN
Start: 2015-07-03 — End: 2015-07-09
  Filled 2015-07-03: qty 2

## 2015-07-03 MED ORDER — LORAZEPAM 0.5 MG PO TABS
1.0000 mg | ORAL_TABLET | Freq: Four times a day (QID) | ORAL | Status: AC | PRN
  Administered 2015-07-04 – 2015-07-05 (×2): 1 mg via ORAL
  Filled 2015-07-03 (×2): qty 2

## 2015-07-03 MED ORDER — CARVEDILOL 3.125 MG PO TABS
6.2500 mg | ORAL_TABLET | Freq: Two times a day (BID) | ORAL | Status: DC
  Administered 2015-07-03 – 2015-07-09 (×12): 6.25 mg via ORAL
  Filled 2015-07-03: qty 1
  Filled 2015-07-03: qty 2
  Filled 2015-07-03 (×6): qty 1
  Filled 2015-07-03 (×2): qty 2
  Filled 2015-07-03: qty 1
  Filled 2015-07-03: qty 2

## 2015-07-03 MED ORDER — SODIUM CHLORIDE 0.9 % IJ SOLN
3.0000 mL | Freq: Two times a day (BID) | INTRAMUSCULAR | Status: DC
  Administered 2015-07-03 – 2015-07-09 (×9): 3 mL via INTRAVENOUS

## 2015-07-03 MED ORDER — MORPHINE SULFATE (PF) 4 MG/ML IV SOLN
4.0000 mg | Freq: Once | INTRAVENOUS | Status: AC
  Administered 2015-07-03: 4 mg via INTRAVENOUS
  Filled 2015-07-03: qty 1

## 2015-07-03 MED ORDER — ACETAMINOPHEN 650 MG RE SUPP
650.0000 mg | Freq: Four times a day (QID) | RECTAL | Status: DC | PRN
Start: 2015-07-03 — End: 2015-07-09

## 2015-07-03 MED ORDER — MORPHINE SULFATE (PF) 2 MG/ML IV SOLN
2.0000 mg | INTRAVENOUS | Status: DC | PRN
  Administered 2015-07-03: 2 mg via INTRAVENOUS
  Filled 2015-07-03: qty 1

## 2015-07-03 MED ORDER — ONDANSETRON HCL 4 MG/2ML IJ SOLN
4.0000 mg | Freq: Once | INTRAMUSCULAR | Status: AC
  Administered 2015-07-03: 4 mg via INTRAVENOUS
  Filled 2015-07-03 (×2): qty 2

## 2015-07-03 MED ORDER — THIAMINE HCL 100 MG/ML IJ SOLN
100.0000 mg | Freq: Every day | INTRAMUSCULAR | Status: DC
  Administered 2015-07-03 – 2015-07-06 (×4): 100 mg via INTRAVENOUS
  Filled 2015-07-03 (×4): qty 2

## 2015-07-03 MED ORDER — FOLIC ACID 1 MG PO TABS
1.0000 mg | ORAL_TABLET | Freq: Every day | ORAL | Status: DC
  Administered 2015-07-03 – 2015-07-09 (×7): 1 mg via ORAL
  Filled 2015-07-03 (×7): qty 1

## 2015-07-03 MED ORDER — IOHEXOL 240 MG/ML SOLN
25.0000 mL | Freq: Once | INTRAMUSCULAR | Status: AC | PRN
  Administered 2015-07-03: 25 mL via ORAL

## 2015-07-03 MED ORDER — ONDANSETRON HCL 4 MG/2ML IJ SOLN: 4.0000 mg | Freq: Four times a day (QID) | INTRAMUSCULAR | Status: DC | PRN

## 2015-07-03 MED ORDER — PANTOPRAZOLE SODIUM 40 MG PO TBEC
40.0000 mg | DELAYED_RELEASE_TABLET | Freq: Every day | ORAL | Status: DC
  Administered 2015-07-03 – 2015-07-09 (×7): 40 mg via ORAL
  Filled 2015-07-03 (×8): qty 1

## 2015-07-03 MED ORDER — PIPERACILLIN-TAZOBACTAM 3.375 G IVPB
3.3750 g | Freq: Once | INTRAVENOUS | Status: AC
  Administered 2015-07-03: 3.375 g via INTRAVENOUS
  Filled 2015-07-03: qty 50

## 2015-07-03 MED ORDER — DEXTROSE-NACL 5-0.9 % IV SOLN
INTRAVENOUS | Status: DC
  Administered 2015-07-03 – 2015-07-05 (×5): via INTRAVENOUS

## 2015-07-03 MED ORDER — LORAZEPAM 2 MG/ML IJ SOLN
1.0000 mg | Freq: Four times a day (QID) | INTRAMUSCULAR | Status: AC | PRN
  Administered 2015-07-05: 1 mg via INTRAVENOUS
  Filled 2015-07-03: qty 1

## 2015-07-03 MED ORDER — SODIUM CHLORIDE 0.9 % IV SOLN
INTRAVENOUS | Status: DC
  Administered 2015-07-03: 10 [IU]/h via INTRAVENOUS
  Administered 2015-07-04: 0.3 [IU]/h via INTRAVENOUS
  Filled 2015-07-03 (×3): qty 2.5

## 2015-07-03 MED ORDER — ONDANSETRON HCL 4 MG PO TABS: 4.0000 mg | ORAL_TABLET | Freq: Four times a day (QID) | ORAL | Status: DC | PRN

## 2015-07-03 MED ORDER — SODIUM CHLORIDE 0.9 % IV SOLN
INTRAVENOUS | Status: DC
  Administered 2015-07-03: 20:00:00 via INTRAVENOUS

## 2015-07-03 MED ORDER — NICOTINE 21 MG/24HR TD PT24
21.0000 mg | MEDICATED_PATCH | Freq: Every day | TRANSDERMAL | Status: DC
  Administered 2015-07-03 – 2015-07-09 (×7): 21 mg via TRANSDERMAL
  Filled 2015-07-03 (×7): qty 1

## 2015-07-03 MED ORDER — MORPHINE SULFATE (PF) 4 MG/ML IV SOLN
4.0000 mg | Freq: Once | INTRAVENOUS | Status: AC
  Administered 2015-07-03: 4 mg via INTRAVENOUS
  Filled 2015-07-03 (×2): qty 1

## 2015-07-03 MED ORDER — PREGABALIN 75 MG PO CAPS
75.0000 mg | ORAL_CAPSULE | Freq: Three times a day (TID) | ORAL | Status: DC
  Administered 2015-07-03 – 2015-07-09 (×18): 75 mg via ORAL
  Filled 2015-07-03 (×18): qty 1

## 2015-07-03 MED ORDER — IOHEXOL 350 MG/ML SOLN
100.0000 mL | Freq: Once | INTRAVENOUS | Status: AC | PRN
  Administered 2015-07-03: 100 mL via INTRAVENOUS

## 2015-07-03 MED ORDER — MORPHINE SULFATE (PF) 4 MG/ML IV SOLN
4.0000 mg | Freq: Once | INTRAVENOUS | Status: AC
Start: 2015-07-03 — End: 2015-07-03
  Administered 2015-07-03: 4 mg via INTRAVENOUS
  Filled 2015-07-03: qty 1

## 2015-07-03 NOTE — ED Notes (Signed)
Report called to Jamie, RN

## 2015-07-03 NOTE — H&P (Signed)
[facility] - [place] at [facility]   PATIENT NAME: Donald HoseFranklin Hart    MR#:  [id number]  DATE OF BIRTH:  28-Dec-1970  DATE OF ADMISSION:  07/03/2015  PRIMARY CARE PHYSICIAN: Dione Housekeeperlmedo, Mario Ernesto, MD   REQUESTING/REFERRING PHYSICIAN: Dr. Derrill KayGoodman  CHIEF COMPLAINT:   Chief Complaint  Patient presents with  . Abdominal Pain    HISTORY OF PRESENT ILLNESS:  Donald HoseFranklin Biever  is a 45 y.o. male with a known history of hypertension, diet-controlled diabetes, alcohol abuse. He presents with 4 days of severe abdominal pain. Pain is sharp 8-9 out of 10 intensity. He has not eaten any food in 3 days. He's been drinking a lot of liquids. No alcohol drinking in 4 days. Pain is mostly on the right side of his abdomen. Nothing makes it better or worse. He did have some nausea vomiting last week. Some diarrhea after taking a stool softener. He feels hot but no fever. Positive for fatigue. In the ER, CT scan consistent with pancreatitis. Laboratory data difficult to get result secondary to light. Neck specimen. I asked the nurse to get a fingerstick and it was 380.  PAST MEDICAL HISTORY:   Past Medical History  Diagnosis Date  . Hypertension   . Gout   . Hyperlipidemia   . Alcohol abuse     PAST SURGICAL HISTORY:   Past Surgical History  Procedure Laterality Date  . No past surgeries      SOCIAL HISTORY:   Social History  Substance Use Topics  . Smoking status: Current Every Day Smoker -- 2.00 packs/day  . Smokeless tobacco: Not on file  . Alcohol Use: 100.8 oz/week    168 Cans of beer per week    FAMILY HISTORY:   Family History  Problem Relation Age of Onset  . Adopted: Yes    DRUG ALLERGIES:  No Known Allergies  REVIEW OF SYSTEMS:  CONSTITUTIONAL: No fever but feeling hot, positive for fatigue.  EYES: No blurred or double vision. Wears reading glasses EARS, NOSE, AND THROAT: No tinnitus or ear pain. Positive for sore throat. Positive for runny  nose. Positive for dysphagia to solids. RESPIRATORY: No cough, shortness of breath, wheezing or hemoptysis.  CARDIOVASCULAR: Some chest pain, no orthopnea, edema.  GASTROINTESTINAL: Positive nausea, vomiting last week. Some diarrhea after stool softener. Severe abdominal pain. No blood in bowel movements GENITOURINARY: No dysuria, [name], nocturia,  [name], [name], numbness, weakness.  PSYCHIATRY: No anxiety or depression.   MEDICATIONS AT HOME:   Prior to Admission medications   Medication Sig Start Date End Date Taking? Authorizing Provider  allopurinol (ZYLOPRIM) 100 MG tablet Take 2 tablets by mouth daily. 05/17/15  Yes Historical Provider, MD  amLODipine (NORVASC) 10 MG tablet Take 1 tablet by mouth daily. 05/17/15  Yes Historical Provider, MD  carvedilol (COREG) 6.25 MG tablet Take 1 tablet by mouth 2 (two) times daily. 05/22/15  Yes Historical Provider, MD  LYRICA 75 MG capsule Take 75 mg by mouth 3 (three) times daily. 06/27/15  Yes Historical Provider, MD  omeprazole (PRILOSEC) 20 MG capsule Take 1 capsule by mouth daily. 04/29/15  Yes Historical Provider, MD  simvastatin (ZOCOR) 40 MG tablet Take 40 mg by mouth daily. 03/30/15  Yes Historical Provider, MD      VITAL SIGNS:  Blood pressure 146/96, pulse 109, temperature 98.4 F (36.9  C), temperature source Oral, resp. rate 16, height 6\' 1"  (1.854 m), weight 108.863 kg (240 lb), SpO2 91 %.  PHYSICAL EXAMINATION:  GENERAL:  44 y.o.-year-old patient lying in the bed with pain.  EYES: Pupils equal, round, [name], normocephalic. Oropharynx and nasopharynx clear.  NECK:  Supple, [name], no tenderness.  LUNGS: Decreased breath  sounds bilaterally, no wheezing, rales,[name], S2 tachycardia. No murmurs, rubs, [name], tender right upper quadrant and center of abdomen, distended. Bowel sounds present. No organomegaly or mass.  EXTREMITIES: Trace edema, no cyanosis, or clubbing.  NEUROLOGIC: Cranial nerves II through XII are intact. Muscle strength 5/5 in all extremities. Sensation intact. Gait not checked.  PSYCHIATRIC: The patient is alert and oriented x 3.  SKIN: No rash, lesion, or ulcer.   LABORATORY PANEL:   CBC  Recent Labs Lab 07/03/15 1103  WBC 26.5*  HGB 15.6  HCT 44.3  PLT 207   ------------------------------------------------------------------------------------------------------------------  Chemistries   Recent Labs Lab 07/03/15 1103  NA UNABLE TO REPORT DUE TO LIPEMIC INTERFERENCE  K UNABLE TO REPORT DUE TO LIPEMIC INTERFERENCE  CL 90*  CO2 UNABLE TO REPORT DUE TO LIPEMIC INTERFERENCE  GLUCOSE UNABLE TO REPORT DUE TO LIPEMIC INTERFERENCE  BUN 9  CREATININE 0.60*  CALCIUM UNABLE TO REPORT DUE TO LIPEMIC INTERFERENCE  AST 27  ALT UNABLE TO REPORT DUE TO LIPEMIC INTERFERENCE  ALKPHOS 105  BILITOT 2.7*   ------------------------------------------------------------------------------------------------------------------    RADIOLOGY:  Ct Abdomen Pelvis W Contrast  07/03/2015  CLINICAL DATA:  45 year old male with right abdominal and pelvic pain with nausea and vomiting for 3 days. EXAM: CT ABDOMEN AND PELVIS WITH CONTRAST TECHNIQUE: Multidetector CT imaging of the abdomen and pelvis was performed using the standard protocol following bolus administration of intravenous contrast. CONTRAST:  OMNIPAQUE IOHEXOL 350 MG/ML SOLN COMPARISON:  None. FINDINGS: Lower chest:  Minimal right basilar atelectasis noted. Hepatobiliary: Hepatic steatosis present. No focal hepatic abnormalities are  identified. The gallbladder is unremarkable. Pancreas: Inflammation along the pancreatic head/uncinate process and transverse duodenum noted. The pancreas is otherwise unremarkable. No hemorrhage, pancreatic necrosis or venous thrombosis. Spleen: Unremarkable Adrenals/Urinary Tract: The kidneys, adrenal glands and bladder are unremarkable. Stomach/Bowel: Mild wall thickening of the transverse duodenum noted with mild adjacent inflammation. There is no evidence of bowel obstruction. The appendix is normal. Vascular/Lymphatic: No enlarged lymph nodes or abdominal aortic aneurysm. Aortic atherosclerotic calcifications are noted. Reproductive: Unremarkable Other: No free fluid, abscess or pneumoperitoneum. Small to moderate bilateral inguinal hernias containing fat are noted. Musculoskeletal: No acute or suspicious abnormalities. IMPRESSION: Inflammation adjacent to the head/uncinate process of the pancreas and transverse duodenum. This may represent pancreatitis or duodenal inflammation/process. No evidence of abscess, pneumoperitoneum or bowel obstruction. Hepatic steatosis. Small to moderate bilateral inguinal hernias containing fat. Aortic atherosclerosis. Electronically Signed   By: Harmon Pier M.D.   On: 07/03/2015 13:27    EKG:   Sinus tachycardia 112 bpm left atrial enlargement right axis deviation  IMPRESSION AND PLAN:   1. Severe pancreatitis, leukocytosis and tachycardia. Patient has a lipemic blood specimen. Patient also drinks a case of beer per day. We'll give 2 L normal saline bolus. I will check a triglyceride level and start an insulin drip. Patient's fingerstick was 380. We'll put on antibiotics empirically. We'll get endocrine consult. 2. Alcohol  abuse- we'll give IV thiamine. Start on CIWA protocol 3. Hypertension essential- continue Coreg and hold Norvasc 4. History of gout on allopurinol 5. Tobacco abuse- smoking cessation counseling done 3 minutes by me. Nicotine patch ordered 6.  Diabetes- previous diet controlled. I will put on insulin drip to lower triglycerides. Likely will need D5 and IV fluids in order to prevent sugars from dropping too low. Continue to monitor fingersticks every one hour.  All the records are reviewed and case discussed with ED provider. Management plans discussed with the patient, family and they are in agreement.  CODE STATUS: Full code  TOTAL TIME TAKING CARE OF THIS PATIENT: 50 minutes, patient is critically ill and will be admitted to the critical care unit for closer monitoring.    Alford Highland M.D on 07/03/2015 at 2:42 PM  Between 7am to 6pm - Pager - [phone]  After 6pm call admission pager [phone]  [facility]  Office  [phone]  CC: Primary care physician; [name], MD

## 2015-07-03 NOTE — ED Notes (Signed)
Pt returned from CT °

## 2015-07-03 NOTE — Progress Notes (Signed)
eLink Physician-Brief Progress Note Patient Name: Donald HoseFranklin Landen DOB: December 03, 1970 MRN: [id number]   Date of Service  07/03/2015  HPI/Events of Note  New Patient admitted to ICU with pancreatitis on CT scan. Patient with hypertriglyceridemia to >4000. LA normal. Started on insulin gtt by hospitalist. Chronic EtOH use documented.  eICU Interventions   Spoke with covering hospitalist via phone. Recommended considering apheresis. Repeat triglyceride level ordered AM 1/3.     Intervention Category Evaluation Type: New Patient Evaluation  Lawanda CousinsJennings Zayveon Raschke 07/03/2015, 6:32 PM

## 2015-07-03 NOTE — ED Notes (Signed)
Called pharmacy regarding insulin drip. Spoke with Barbara CowerJason at this time. Will start insulin drip when received.

## 2015-07-03 NOTE — ED Notes (Signed)
Called pharmacy regarding insulin drip, pharmacy stated they were working on it. Will start when received.

## 2015-07-03 NOTE — Progress Notes (Signed)
ANTIBIOTIC CONSULT NOTE - INITIAL  Pharmacy Consult for Zosyn Dosing Indication: Intra-Abdominal Infection  No Known Allergies  Patient Measurements: Height: 6\' 1"  (185.4 cm) Weight: 240 lb (108.863 kg) IBW/kg (Calculated) : 79.9 Adjusted Body Weight: 91.5 kg  Vital Signs: Temp: 98.4 F (36.9 C) (01/02 1028) Temp Source: Oral (01/02 1028) BP: 146/96 mmHg (01/02 1200) Pulse Rate: 109 (01/02 1200) Intake/Output from previous day:   Intake/Output from this shift:    Labs:  Recent Labs  07/03/15 1103  WBC 26.5*  HGB 15.6  PLT 207  CREATININE 0.60*   Estimated Creatinine Clearance: 155.7 mL/min (by C-G formula based on Cr of 0.6). No results for input(s): VANCOTROUGH, VANCOPEAK, VANCORANDOM, GENTTROUGH, GENTPEAK, GENTRANDOM, TOBRATROUGH, TOBRAPEAK, TOBRARND, AMIKACINPEAK, AMIKACINTROU, AMIKACIN in the last 72 hours.   Microbiology: No results found for this or any previous visit (from the past 720 hour(s)).  Medical History: Past Medical History  Diagnosis Date  . Hypertension   . Gout   . Hyperlipidemia   . Alcohol abuse     Medications:  Scheduled:  . folic acid  1 mg Oral Daily  . heparin  5,000 Units Subcutaneous 3 times per day  . multivitamin with minerals  1 tablet Oral Daily  . nicotine  21 mg Transdermal Daily  . sodium chloride  3 mL Intravenous Q12H  . thiamine IV  100 mg Intravenous Daily   Infusions:  . sodium chloride    . dextrose 5 % and 0.9% NaCl    . insulin (NOVOLIN-R) infusion    . piperacillin-tazobactam (ZOSYN)  IV    . sodium chloride 1,000 mL (07/03/15 1459)  . sodium chloride 1,000 mL (07/03/15 1459)   Assessment: Pharmacy consulted to dose Zosyn in a 45 yo male with acute pancreatitis.    SCr: 0.6 (0.8), est CrCl~120 mL/min  Goal of Therapy:  Resolution of infection  Plan:  Will transition patient to Zosyn 3.375 gm IV q8h based on renal function.  Pharmacy will continue to follow.  Clarisa Schoolsrystal Ehren Berisha, PharmD Clinical  Pharmacist 07/03/2015

## 2015-07-03 NOTE — ED Provider Notes (Signed)
[facility] Emergency Department Provider Note    ____________________________________________  Time seen: 1205  I have reviewed the triage vital signs and the nursing notes.   HISTORY  Chief Complaint Abdominal Pain   History limited by: Not Limited   HPI Donald Hart is a 45 y.o. male with history of hypertension, hyperlipidemia, heartburn who presents to the emergency department today because of abdominal pain. He states it started 3 days ago. It has been constant. It is severe. He states it is a sharp pain. It is located in the right side of his abdomen. It is worse with eating. He denies any bowel movements for the past 3 days. He denies any nausea or vomiting. He feels that he has been febrile but has not had a measured temperature. He states he had similar pain a number of years ago that resolved on its own. Denies any abdominal surgeries.     Past Medical History  Diagnosis Date  . Hypertension     There are no active problems to display for this patient.   History reviewed. No pertinent past surgical history.  Current Outpatient Rx  Name  Route  Sig  Dispense  Refill  . allopurinol (ZYLOPRIM) 100 MG tablet   Oral   Take 2 tablets by mouth daily.      0   . amLODipine (NORVASC) 10 MG tablet   Oral   Take 1 tablet by mouth daily.      0   . carvedilol (COREG) 6.25 MG tablet   Oral   Take 1 tablet by mouth 2 (two) times daily.      11   . LYRICA 75 MG capsule   Oral   Take 75 mg by mouth 3 (three) times daily.      5     Dispense as written.   Marland Kitchen omeprazole (PRILOSEC) 20 MG capsule   Oral   Take 1 capsule by mouth daily.      11   . simvastatin (ZOCOR) 40 MG tablet   Oral   Take 40 mg by mouth daily.      1     Allergies Review of patient's allergies indicates no known allergies.  History reviewed. No pertinent family history.  Social History Social History  Substance Use Topics  . Smoking status:  Current Every Day Smoker  . Smokeless tobacco: None  . Alcohol Use: Yes    Review of Systems  Constitutional: Negative for fever. Cardiovascular: Negative for chest pain. Respiratory: Negative for shortness of breath. Gastrointestinal: positive for right sided abdominal pain. Neurological: Negative for headaches, focal weakness or numbness.   10-point ROS otherwise negative.  ____________________________________________   PHYSICAL EXAM:  VITAL SIGNS: ED Triage Vitals  Enc Vitals Group     BP 07/03/15 1028 150/107 mmHg     Pulse Rate 07/03/15 1028 122     Resp 07/03/15 1028 22     Temp 07/03/15 1028 98.4 F (36.9 C)     Temp Source 07/03/15 1028 Oral     SpO2 07/03/15 1028 96 %     Weight 07/03/15 1028 240 lb (108.863 kg)     Height 07/03/15 1028 6\' 1"  (1.854 m)     Head Cir --      Peak Flow --      Pain Score 07/03/15 1028 7   Constitutional: Alert and oriented. appears uncomfortable. Eyes: Conjunctivae are normal. PERRL. Normal extraocular movements. ENT   Head: Normocephalic and atraumatic.  Nose: No congestion/rhinnorhea.   Mouth/Throat: Mucous membranes are moist.   Neck: No stridor. Hematological/Lymphatic/Immunilogical: No cervical lymphadenopathy. Cardiovascular:  Tachycardic regular rhythm.  No murmurs, rubs, or gallops. Respiratory: Normal respiratory effort without tachypnea nor retractions. Breath sounds are clear and equal bilaterally. No wheezes/rales/rhonchi. Gastrointestinal: Soft. Tender to palpation in the right abdomen. Positive guarding. Negative rebound. No distention. There is no CVA tenderness. Genitourinary: Deferred Musculoskeletal: Normal range of motion in all extremities. No joint effusions.  No lower extremity tenderness nor edema. Neurologic:  Normal speech and language. No gross focal neurologic deficits are appreciated.  Skin:  Skin is warm, dry and intact. No rash noted. Psychiatric: Mood and affect are normal. Speech  and behavior are normal. Patient exhibits appropriate insight and judgment.  ____________________________________________    LABS (pertinent positives/negatives)  Labs Reviewed  COMPREHENSIVE METABOLIC PANEL - Abnormal; Notable for the following:    Chloride 90 (*)    Creatinine, Ser 0.60 (*)    Total Bilirubin 2.7 (*)    All other components within normal limits  CBC - Abnormal; Notable for the following:    WBC 26.5 (*)    All other components within normal limits  URINALYSIS COMPLETEWITH MICROSCOPIC ([facility] ONLY) - Abnormal; Notable for the following:    Color, Urine YELLOW (*)    APPearance CLEAR (*)    Glucose, UA >500 (*)    Ketones, ur 1+ (*)    Specific Gravity, Urine 1.031 (*)    Hgb urine dipstick 1+ (*)    Squamous Epithelial / LPF 0-5 (*)    All other components within normal limits  GLUCOSE, CAPILLARY - Abnormal; Notable for the following:    Glucose-Capillary 380 (*)    All other components within normal limits  LIPASE, BLOOD  LACTIC ACID, PLASMA  LACTIC ACID, PLASMA  LIPID PANEL  HEMOGLOBIN A1C     ____________________________________________   EKG  I, Phineas SemenGraydon Izaan Kingbird, attending physician, personally viewed and interpreted this EKG  EKG Time: 1128 Rate: 112 Rhythm: sinus tachycardia Axis: normal Intervals: qtc 459 QRS: narrow ST changes: no st elevation Impression: left atrial enlargement, sinus tachycardia, abnormal ekg ____________________________________________    RADIOLOGY  CT abd/pel IMPRESSION: Inflammation adjacent to the head/uncinate process of the pancreas and transverse duodenum. This may represent pancreatitis or duodenal inflammation/process. No evidence of abscess, pneumoperitoneum or bowel obstruction.  Hepatic steatosis.  Small to moderate bilateral inguinal hernias containing fat.  Aortic atherosclerosis.   ____________________________________________   PROCEDURES  Procedure(s) performed: None  Critical Care  performed: No  ____________________________________________   INITIAL IMPRESSION / ASSESSMENT AND PLAN / ED COURSE  Pertinent labs & imaging results that were available during my care of the patient were reviewed by me and considered in my medical decision making (see chart for details).  Patient presented to the emergency department today because of concerns for abdominal pain. On exam patient does have pain to palpation of the right side and epigastrium. Bedside US did not show any gallstones. Blood work was notable for lipemia that interfered with multiple levels, particularly lipase. CT abd/pel was concerning for inflammation around the pancrease. Will plan on admission for pancreatitis.   ____________________________________________   FINAL CLINICAL IMPRESSION(S) / ED DIAGNOSES  Final diagnoses:  Acute pancreatitis, unspecified pancreatitis type  Hyperlipidemia  Abdominal pain, unspecified abdominal location  Tachycardia     Phineas SemenGraydon Kyia Rhude, MD 07/03/15 1435

## 2015-07-03 NOTE — ED Notes (Signed)
Pt to ed with c/o abd pain that started on Saturday with n/v/d.  Pt reports increased thirst.

## 2015-07-04 LAB — CBC
HCT: 39.8 % — ABNORMAL LOW (ref 40.0–52.0)
Hemoglobin: 13.6 g/dL (ref 13.0–18.0)
MCH: 32 pg (ref 26.0–34.0)
MCHC: 34.3 g/dL (ref 32.0–36.0)
MCV: 93.3 fL (ref 80.0–100.0)
PLATELETS: 149 10*3/uL — AB (ref 150–440)
RBC: 4.27 MIL/uL — ABNORMAL LOW (ref 4.40–5.90)
RDW: 12.1 % (ref 11.5–14.5)
WBC: 16.2 10*3/uL — AB (ref 3.8–10.6)

## 2015-07-04 LAB — GLUCOSE, CAPILLARY
GLUCOSE-CAPILLARY: 142 mg/dL — AB (ref 65–99)
GLUCOSE-CAPILLARY: 122 mg/dL — AB (ref 65–99)
GLUCOSE-CAPILLARY: 208 mg/dL — AB (ref 65–99)
Glucose-Capillary: 126 mg/dL — ABNORMAL HIGH (ref 65–99)
Glucose-Capillary: 133 mg/dL — ABNORMAL HIGH (ref 65–99)
Glucose-Capillary: 138 mg/dL — ABNORMAL HIGH (ref 65–99)
Glucose-Capillary: 126 mg/dL — ABNORMAL HIGH (ref 65–99)
Glucose-Capillary: 131 mg/dL — ABNORMAL HIGH (ref 65–99)
Glucose-Capillary: 159 mg/dL — ABNORMAL HIGH (ref 65–99)
Glucose-Capillary: 139 mg/dL — ABNORMAL HIGH (ref 65–99)
Glucose-Capillary: 170 mg/dL — ABNORMAL HIGH (ref 65–99)
Glucose-Capillary: 171 mg/dL — ABNORMAL HIGH (ref 65–99)
Glucose-Capillary: 115 mg/dL — ABNORMAL HIGH (ref 65–99)
Glucose-Capillary: 100 mg/dL — ABNORMAL HIGH (ref 65–99)
Glucose-Capillary: 79 mg/dL (ref 65–99)
Glucose-Capillary: 86 mg/dL (ref 65–99)
Glucose-Capillary: 136 mg/dL — ABNORMAL HIGH (ref 65–99)
Glucose-Capillary: 178 mg/dL — ABNORMAL HIGH (ref 65–99)
Glucose-Capillary: 200 mg/dL — ABNORMAL HIGH (ref 65–99)
Glucose-Capillary: 225 mg/dL — ABNORMAL HIGH (ref 65–99)

## 2015-07-04 LAB — BASIC METABOLIC PANEL
Anion gap: 7 (ref 5–15)
BUN: 9 mg/dL (ref 6–20)
CO2: 26 mmol/L (ref 22–32)
CREATININE: 0.61 mg/dL (ref 0.61–1.24)
Calcium: 8.5 mg/dL — ABNORMAL LOW (ref 8.9–10.3)
Chloride: 103 mmol/L (ref 101–111)
Glucose, Bld: 135 mg/dL — ABNORMAL HIGH (ref 65–99)
POTASSIUM: 3.2 mmol/L — AB (ref 3.5–5.1)
SODIUM: 136 mmol/L (ref 135–145)

## 2015-07-04 LAB — TRIGLYCERIDES
Triglycerides: 1995 mg/dL — ABNORMAL HIGH (ref <150)
Triglycerides: 1771 mg/dL — ABNORMAL HIGH
Triglycerides: 1314 mg/dL — ABNORMAL HIGH (ref <150)

## 2015-07-04 MED ORDER — SODIUM CHLORIDE 0.9 % IV SOLN
INTRAVENOUS | Status: DC
  Administered 2015-07-05: 8.5 [IU]/h via INTRAVENOUS
  Administered 2015-07-06: 3.7 [IU]/h via INTRAVENOUS
  Administered 2015-07-06: 2 [IU]/h via INTRAVENOUS
  Filled 2015-07-04 (×2): qty 2.5

## 2015-07-04 MED ORDER — HYDROCODONE-ACETAMINOPHEN 5-325 MG PO TABS
1.0000 | ORAL_TABLET | ORAL | Status: DC | PRN
Start: 2015-07-04 — End: 2015-07-05
  Administered 2015-07-04 – 2015-07-05 (×5): 1 via ORAL
  Filled 2015-07-04 (×5): qty 1

## 2015-07-04 MED ORDER — GEMFIBROZIL 600 MG PO TABS
600.0000 mg | ORAL_TABLET | Freq: Two times a day (BID) | ORAL | Status: DC
  Administered 2015-07-04 – 2015-07-09 (×11): 600 mg via ORAL
  Filled 2015-07-04 (×11): qty 1

## 2015-07-04 MED ORDER — HYDROMORPHONE HCL 1 MG/ML IJ SOLN
2.0000 mg | INTRAMUSCULAR | Status: DC | PRN
  Administered 2015-07-04 – 2015-07-05 (×7): 2 mg via INTRAVENOUS
  Filled 2015-07-04 (×7): qty 2

## 2015-07-04 MED ORDER — INSULIN REGULAR BOLUS VIA INFUSION
0.0000 [IU] | Freq: Three times a day (TID) | INTRAVENOUS | Status: DC
  Filled 2015-07-04: qty 10

## 2015-07-04 MED ORDER — DEXTROSE-NACL 5-0.45 % IV SOLN
INTRAVENOUS | Status: DC
Start: 2015-07-04 — End: 2015-07-04

## 2015-07-04 MED ORDER — POTASSIUM CHLORIDE 10 MEQ/100ML IV SOLN
10.0000 meq | INTRAVENOUS | Status: AC
  Administered 2015-07-04 (×4): 10 meq via INTRAVENOUS
  Filled 2015-07-04 (×4): qty 100

## 2015-07-04 MED ORDER — DEXTROSE 50 % IV SOLN: 25.0000 mL | INTRAVENOUS | Status: DC | PRN

## 2015-07-04 MED ORDER — SODIUM CHLORIDE 0.9 % IV SOLN: INTRAVENOUS | Status: DC

## 2015-07-04 NOTE — Progress Notes (Addendum)
Called by RN stating CBG is 79. Per Dr. Aliene AltesAbisogun, placed transition orders for the regular glucostabilizer order set in EPIC with cosign required. Patient still has D5 NS at 150/hr running per previous order. Glucostabilizer initiated by [employer]. Will follow.  Thanks, Christena DeemShannon Tamanika Heiney RN, MSN, [facility] Inpatient Diabetes Coordinator Team Pager [phone] (8a-5p)

## 2015-07-04 NOTE — Progress Notes (Signed)
CBG 79. S/w dm coordinator,  Per Dr. Aliene AltesAbisogun, placed transition orders for the regular non dka glucostabilizer order set in EPIC with cosign required. Patient still has D5 NS at 150/hr running per previous order. CBG recheck 86. Glucostabilizer initiated by [employer]. Will continue to monitor.

## 2015-07-04 NOTE — Progress Notes (Signed)
[facility] - [place] [facility]   PATIENT NAME: Donald Hart    MR#:  [id number]  DATE OF BIRTH:  11/04/1970  SUBJECTIVE:  CHIEF COMPLAINT:   Chief Complaint  Patient presents with  . Abdominal Pain   - still complains of significant abdominal pain- diffuse. - abdomen seems distended - no nausea, vomiting  REVIEW OF SYSTEMS:  Review of Systems  Constitutional: Negative for fever and chills.  HENT: Negative for ear discharge, ear pain and nosebleeds.   Eyes: Negative for blurred vision.  Respiratory: Negative for cough, shortness of breath and wheezing.   Cardiovascular: Negative for chest pain and palpitations.  Gastrointestinal: Positive for heartburn and abdominal pain. Negative for nausea, vomiting, diarrhea and constipation.  Genitourinary: Negative for dysuria.  Musculoskeletal: Negative for myalgias.  Skin: Negative for rash.  Neurological: Negative for dizziness, tremors, speech change, focal weakness, seizures and headaches.  Psychiatric/Behavioral: Negative for depression.    DRUG ALLERGIES:  No Known Allergies  VITALS:  Blood pressure 136/84, pulse 87, temperature 98.8 F (37.1 C), temperature source Oral, resp. rate 17, height 6\' 1"  (1.854 m), weight 110.2 kg (242 lb 15.2 oz), SpO2 98 %.  PHYSICAL EXAMINATION:  Physical Exam  GENERAL:  45 y.o.-year-old obese patient lying in the bed with no acute distress.  EYES: Pupils equal, round, [name], normocephalic. Oropharynx and nasopharynx clear.  NECK:  Supple, [name], [name], no wheezing, rales,[name], [name], rubs, or gallops.  ABDOMEN: firm, distended, generalized tenderness, no guarding or  rigidity noted. Bowel sounds are hypoactive. No organomegaly or mass.  EXTREMITIES: No pedal edema, cyanosis, or clubbing.  NEUROLOGIC: Cranial nerves II through XII are intact. Muscle strength 5/5 in all extremities. Sensation intact. Gait not checked.  PSYCHIATRIC: The patient is alert and oriented x 3.  SKIN: No obvious rash, lesion, or ulcer.    LABORATORY PANEL:   CBC  Recent Labs Lab 07/04/15 0424  WBC 16.2*  HGB 13.6  HCT 39.8*  PLT 149*   ------------------------------------------------------------------------------------------------------------------  Chemistries   Recent Labs Lab 07/03/15 1103 07/04/15 0424  NA UNABLE TO REPORT DUE TO LIPEMIC INTERFERENCE 136  K UNABLE TO REPORT DUE TO LIPEMIC INTERFERENCE 3.2*  CL 90* 103  CO2 UNABLE TO REPORT DUE TO LIPEMIC INTERFERENCE 26  GLUCOSE UNABLE TO REPORT DUE TO LIPEMIC INTERFERENCE 135*  BUN 9 9  CREATININE 0.60* 0.61  CALCIUM UNABLE TO REPORT DUE TO LIPEMIC INTERFERENCE 8.5*  AST 27  --   ALT UNABLE TO REPORT DUE TO LIPEMIC INTERFERENCE  --   ALKPHOS 105  --   BILITOT 2.7*  --    ------------------------------------------------------------------------------------------------------------------  Cardiac Enzymes No results for input(s): TROPONINI in the last 168 hours. ------------------------------------------------------------------------------------------------------------------  RADIOLOGY:  Ct Abdomen Pelvis W Contrast  07/03/2015  CLINICAL DATA:  45 year old male with right abdominal and pelvic pain with nausea and vomiting for 3 days. EXAM: CT ABDOMEN AND PELVIS WITH CONTRAST TECHNIQUE: Multidetector CT imaging of the abdomen and pelvis was performed using the standard protocol following bolus administration of intravenous contrast. CONTRAST:  OMNIPAQUE IOHEXOL 350 MG/ML SOLN COMPARISON:  None. FINDINGS: Lower chest:  Minimal right basilar atelectasis noted. Hepatobiliary: Hepatic steatosis present. No  focal hepatic abnormalities are identified. The gallbladder is unremarkable. Pancreas: Inflammation  along the pancreatic head/uncinate process and transverse duodenum noted. The pancreas is otherwise unremarkable. No hemorrhage, pancreatic necrosis or venous thrombosis. Spleen: Unremarkable Adrenals/Urinary Tract: The kidneys, adrenal glands and bladder are unremarkable. Stomach/Bowel: Mild wall thickening of the transverse duodenum noted with mild adjacent inflammation. There is no evidence of bowel obstruction. The appendix is normal. Vascular/Lymphatic: No enlarged lymph nodes or abdominal aortic aneurysm. Aortic atherosclerotic calcifications are noted. Reproductive: Unremarkable Other: No free fluid, abscess or pneumoperitoneum. Small to moderate bilateral inguinal hernias containing fat are noted. Musculoskeletal: No acute or suspicious abnormalities. IMPRESSION: Inflammation adjacent to the head/uncinate process of the pancreas and transverse duodenum. This may represent pancreatitis or duodenal inflammation/process. No evidence of abscess, pneumoperitoneum or bowel obstruction. Hepatic steatosis. Small to moderate bilateral inguinal hernias containing fat. Aortic atherosclerosis. Electronically Signed   By: Harmon PierJeffrey  Hu M.D.   On: 07/03/2015 13:27    EKG:   Orders placed or performed during the hospital encounter of 07/03/15  . ED EKG  . ED EKG  . EKG 12-Lead  . EKG 12-Lead  . EKG 12-Lead  . EKG 12-Lead    ASSESSMENT AND PLAN:   45y/o obese male with PMH significant for Hypertension, hyperlipidemia and alcohol abuse admitted for acute pancreatitis  #1 Acute pancreatitis- alcohol induced and also hypertriglyceridemia - TG level>4000 on admission- apheresis not available here - started on insulin drip and also D5 drip- not a diabetic - Endocrinology consult to help with insulin drip. - continue insulin drip until TG levels<500 - continue statin and gemfibrozil added - Also an  alcoholic - CT abd with no gall stones - monitor lipase - cont NPO, IV fluids for now. Pain meds adjusted  #2 Leukocytosis- stress reaction, but await cultures - cont zosyn for now - no source of infec identified  #3 Hypokalemia- likely from insulin drip- being replaced  #4 Tobacco use disorder- nicotine patch  #5 Alcohol abuse disorder- watch for any withdrawals On CIWA protocol  #6 HTN- coreg  #7 Hyperlipidemia- on statin and gemfibrozil added  #8 DVT Prophylaxis- SQ Heparin   All the records are reviewed and case discussed with Care Management/Social Workerr. Management plans discussed with the patient, family and they are in agreement.  CODE STATUS: Full Code  TOTAL CRITICAL CARE TIME SPENT IN TAKING CARE OF THIS PATIENT: 42 minutes.   POSSIBLE D/C IN 3 DAYS, DEPENDING ON CLINICAL CONDITION.   Enid BaasKALISETTI,Beauden Tremont M.D on 07/04/2015 at 8:10 AM  Between 7am to 6pm - Pager - [phone]  After 6pm go to [email or web address] - password [email or web address] [facility]  [facility]  Office  [phone]  CC: Primary care physician; Dione Housekeeperlmedo, Mario Ernesto, MD

## 2015-07-04 NOTE — Progress Notes (Signed)
Initial Nutrition Assessment    INTERVENTION:   Coordination of Care: await diet progression as medically able  Education: follow-up and provide diet education on DM and TG nutrition therapy   NUTRITION DIAGNOSIS:   Inadequate oral intake related to acute illness as evidenced by NPO status.  GOAL:   Patient will meet greater than or equal to 90% of their needs  MONITOR:    (Energy Intake, Anthropometrics, Electrolyte/Renal Profile, Digestive System, Glucose Profile)  REASON FOR ASSESSMENT:   Diagnosis    ASSESSMENT:    Pt admitted with abdominal pain with acute pancreatitis due to hypertriglyceridemia, on insulin drip; pt also with newly dx DM  Past Medical History  Diagnosis Date  . Hypertension   . Gout   . Hyperlipidemia   . Alcohol abuse     Diet Order:  Diet NPO time specified   Food and nutrition related history: unable to eat for 3 days prior to admission; no EtOH in 4 days (per hx pt drinks a case of beer per day)  Digestive System: +diffuse abdominal pain, +abdominal distention, no N/V at present  Electrolyte and Renal Profile:  Recent Labs Lab 07/03/15 1103 07/04/15 0424  BUN 9 9  CREATININE 0.60* 0.61  NA UNABLE TO REPORT DUE TO LIPEMIC INTERFERENCE 136  K UNABLE TO REPORT DUE TO LIPEMIC INTERFERENCE 3.2*   Glucose Profile:   Recent Labs  07/04/15 1009 07/04/15 1128 07/04/15 1331  GLUCAP 170* 171* 115*   Lab Results  Component Value Date   HGBA1C 10.6* 07/03/2015   Lipid Profile:     Component Value Date/Time   CHOL 558* 07/03/2015 1109   TRIG 1771* 07/04/2015 0845   HDL NOT REPORTED DUE TO HIGH TRIGLYCERIDES 07/03/2015 1109   CHOLHDL NOT REPORTED DUE TO HIGH TRIGLYCERIDES 07/03/2015 1109   VLDL UNABLE TO CALCULATE IF TRIGLYCERIDE OVER 400 mg/dL 47/82/956201/08/2015 [id number]   LDLCALC UNABLE TO CALCULATE IF TRIGLYCERIDE OVER 400 mg/dL 65/78/469601/08/2015 [id number]    Meds: D5-NS with Kcl at 150 ml/hr, insulin drip, MVI  Nutrition Focused physical exam:   Unable to complete Nutrition-Focused physical exam at this time.   Height:   Ht Readings from Last 1 Encounters:  07/03/15 6\' 1"  (1.854 m)    Weight:   Wt Readings from Last 1 Encounters:  07/03/15 242 lb 15.2 oz (110.2 kg)    Ideal Body Weight:     BMI:  Body mass index is 32.06 kg/(m^2).  Estimated Nutritional Needs:   Kcal:  2325-2790 kcals (BEE 1789, 1.3 AF, 1.0-1.2 IF) using IBW 84 kg  Protein:  84-101 g (1.0-1.2 g/kg)   Fluid:  2100-2520 mL (25-30 ml/kg)   MODERATE Care Level  Romelle Starcherate Lantz Hermann MS, RD, LDN [phone] Pager  [phone] Weekend/On-Call Pager

## 2015-07-04 NOTE — Consult Note (Addendum)
Endocrine Initial Consult Note Date of Consult: 07/04/2015  [facility]  Service Requesting Consult: Dr. Renae GlossWieting  SUBJECTIVE: Reason for Consultation: hypertriglyceridemia and related pancreatitis  History of Present Illness: Donald Hart is a 45 y.o. male admitted with acute pancreatitis thought to be induced by alcohol abuse and hypertriglyceridemia. Triglyceride level was 4000+ upon admission. This was his first ever episode of acute pancreatitis. He reports recently learning of a lipid problem. He was taking a fibrate at home. He has had Pre-diabetes but denies history of type 2 diabetes. Hb A1c is 10.6% He denies any family history of diabetes. He is a 1 pack per day cigarette smoker.   Upon admission to the ICU, he was made NPO and has received IV fluid hydration, pain control, IV insulin, and IV antibiotics. He reports his pain was more controlled as of this morning and he had no nausea at the time.  Patient Active Problem List   Diagnosis Date Noted  . Acute pancreatitis 07/03/2015     Past Medical History  Diagnosis Date  . Hypertension   . Gout   . Hyperlipidemia   . Alcohol abuse    Past Surgical History  Procedure Laterality Date  . No past surgeries     Family History  Problem Relation Age of Onset  . Adopted: Yes    Social History:  Social History  Substance Use Topics  . Smoking status: Current Every Day Smoker -- 2.00 packs/day  . Smokeless tobacco: Not on file  . Alcohol Use: 100.8 oz/week    168 Cans of beer per week     No Known Allergies   Medications:  No current facility-administered medications on file prior to encounter.   No current outpatient prescriptions on file prior to encounter.    Review of Systems: Pertinent items noted in HPI and remainder of comprehensive ROS otherwise negative.  OBJECTIVE: Temp:  [98.7 F (37.1 C)-99.8 F (37.7 C)] 99.1 F (37.3 C) (01/03 1400) Pulse Rate:  [79-111]  84 (01/03 1600) Resp:  [9-17] 11 (01/03 1600) BP: (123-153)/(72-99) 138/94 mmHg (01/03 1600) SpO2:  [88 %-99 %] 96 % (01/03 1600)  Temp (24hrs), Avg:99.1 F (37.3 C), Min:98.7 F (37.1 C), Max:99.8 F (37.7 C)  Weight: 110.2 kg (242 lb 15.2 oz)  Physical Exam: Gen: ill-appearing but non-toxic, well-nourished, obese  HEENT: [place]/AT, eyes anicteric, EOMI, mucous membranes dry, no oropharyngeal lesions Neck: no thyroid enlargement or nodules noted, no cervical lymphadenopathy CAD: tachycardic, regular rhythm. No murmur rubs or gallops PULM: clear to ausculation anteriorly GI: soft, distended, +bowel sounds. EXT: no clubbing, cyanosis or edema  Skin: warm, dry, no rash Neuro: grossly non focal, normal DTRs, alert and oriented x 3  Lab data:  BMP Latest Ref Rng 07/04/2015 07/03/2015  Glucose 65 - 99 mg/dL 782(N135(H) UNABLE TO REPORT DUE TO LIPEMIC INTERFERENCE  BUN 6 - 20 mg/dL 9 9  Creatinine 5.620.61 - 1.24 mg/dL 1.300.61 8.65(H0.60(L)  Sodium 846135 - 145 mmol/L 136 UNABLE TO REPORT DUE TO LIPEMIC INTERFERENCE  Potassium 3.5 - 5.1 mmol/L 3.2(L) UNABLE TO REPORT DUE TO LIPEMIC INTERFERENCE  Chloride 101 - 111 mmol/L 103 90(L)  CO2 22 - 32 mmol/L 26 UNABLE TO REPORT DUE TO LIPEMIC INTERFERENCE  Calcium 8.9 - 10.3 mg/dL 9.6(E8.5(L) UNABLE TO REPORT DUE TO LIPEMIC INTERFERENCE    CBC Latest Ref Rng 07/04/2015 07/03/2015  WBC 3.8 - 10.6 K/uL 16.2(H) 26.5(H)  Hemoglobin 13.0 - 18.0 g/dL 95.213.6 84.115.6  Hematocrit 32.440.0 - 52.0 %  39.8(L) 44.3  Platelets 150 - 440 K/uL 149(L) 207   Component     Latest Ref Rng 07/03/2015 07/04/2015 07/04/2015          4:24 AM  8:45 AM  Triglycerides     <150 mg/dL 4098 (H) 1191 (H) 4782 (H)   Component     Latest Ref Rng 07/03/2015  Hemoglobin A1C     4.0 - 6.0 % 10.6 (H)   Blood glucose values reviewed in glucose accordion view  ASSESSMENT:  1. Hypertriglyceridemia and related pancreatitis 2. New-onset type 2 DM  RECOMMENDATIONS:   Pt should remain NPO Continue IV D5 NS at current  rate, goal is to maintain BG 140-180 range Continue IV insulin infusion but transition to glucose stabilizer non-DKA protocol if BG starts to trend <130 mg/dL. Hourly glucometer checks. Check triglyceride levels daily. Once closer to 500 range, can transition for IV to subQ insulin.  Continue gemfibrozil. Pain control and IV antibiotics per primary team. He will need insulin teaching prior to discharge. I did discuss the new diagnosis of diabetes with him today. He will need to follow up with me within 2 weeks of discharge. Will follow along closely.  Doylene Canning, MD [facility] Endocrinology

## 2015-07-04 NOTE — Progress Notes (Signed)
Patient seen for hypertriglyceridemia and related pancreatitis. Also newly-diagnosed DM type 2 with Hb A1c 10. Full consult note to follow.   Recommend: NPO, pain control Continue IV fluids and hourly glucometer checks. IV insulin until Triglyceride levels closer to 500 If BG <130, please switch to glucose stabilizer non-DKA protocol to avoid hypoglycemia Will follow along   Doylene CanningAbby Ruqaya Strauss, MD [facility] End[facility]

## 2015-07-04 NOTE — Progress Notes (Signed)
Pt started on insulin gtt for increased triglycerides on 07/03/15, bs dropped below 100, per order pt started on non dka glucostablizer, pt bs at 1845 136, glucostablizer indicates to hold insulin gtt, last triglyceride check was 1776. Called elink to discuss due to insulin gtt being order for triglycerides. elink dr instructed rn to call prime dr for instruction/new orders.  Insulin gtt placed on hold at this time and prime dr paged.

## 2015-07-04 NOTE — Progress Notes (Signed)
ANTIBIOTIC CONSULT NOTE - INITIAL  Pharmacy Consult for Zosyn Dosing Indication: Intra-Abdominal Infection  No Known Allergies  Patient Measurements: Height: 6\' 1"  (185.4 cm) Weight: 242 lb 15.2 oz (110.2 kg) IBW/kg (Calculated) : 79.9 Adjusted Body Weight: 91.5 kg  Vital Signs: Temp: 98.7 F (37.1 C) (01/03 0800) Temp Source: Oral (01/03 0800) BP: 143/90 mmHg (01/03 1200) Pulse Rate: 91 (01/03 1200) Intake/Output from previous day: 01/02 0701 - 01/03 0700 In: 1600.8 [I.V.:1500.8; IV Piggyback:100] Out: 850 [Urine:850] Intake/Output from this shift: Total I/O In: 683 [I.V.:483; IV Piggyback:200] Out: -   Labs:  Recent Labs  07/03/15 1103 07/04/15 0424  WBC 26.5* 16.2*  HGB 15.6 13.6  PLT 207 149*  CREATININE 0.60* 0.61   Estimated Creatinine Clearance: 156.5 mL/min (by C-G formula based on Cr of 0.61). No results for input(s): VANCOTROUGH, VANCOPEAK, VANCORANDOM, GENTTROUGH, GENTPEAK, GENTRANDOM, TOBRATROUGH, TOBRAPEAK, TOBRARND, AMIKACINPEAK, AMIKACINTROU, AMIKACIN in the last 72 hours.   Microbiology: Recent Results (from the past 720 hour(s))  MRSA PCR Screening     Status: None   Collection Time: 07/03/15  5:40 PM  Result Value Ref Range Status   MRSA by PCR NEGATIVE NEGATIVE Final    Comment:        The GeneXpert MRSA Assay (FDA approved for NASAL specimens only), is one component of a comprehensive MRSA colonization surveillance program. It is not intended to diagnose MRSA infection nor to guide or monitor treatment for MRSA infections.     Medical History: Past Medical History  Diagnosis Date  . Hypertension   . Gout   . Hyperlipidemia   . Alcohol abuse     Medications:  Scheduled:  . allopurinol  200 mg Oral Daily  . carvedilol  6.25 mg Oral BID  . folic acid  1 mg Oral Daily  . gemfibrozil  600 mg Oral BID AC  . heparin  5,000 Units Subcutaneous 3 times per day  . multivitamin with minerals  1 tablet Oral Daily  . nicotine  21 mg  Transdermal Daily  . pantoprazole  40 mg Oral Daily  . piperacillin-tazobactam (ZOSYN)  IV  3.375 g Intravenous 3 times per day  . potassium chloride  10 mEq Intravenous Q1 Hr x 4  . pregabalin  75 mg Oral TID  . sodium chloride  3 mL Intravenous Q12H  . thiamine IV  100 mg Intravenous Daily   Infusions:  . dextrose 5 % and 0.9% NaCl 150 mL/hr at 07/04/15 0512  . insulin (NOVOLIN-R) infusion 10 Units/hr (07/03/15 1735)   Assessment: Pharmacy consulted to dose Zosyn in a 45 yo male with acute pancreatitis.    Plan:  Will continue Zosyn 3.375 gm IV q8h.   Pharmacy will continue to follow.  Luisa HartScott Shena Vinluan, PharmD  Clinical Pharmacist 07/04/2015

## 2015-07-05 ENCOUNTER — Inpatient Hospital Stay: Payer: BLUE CROSS/BLUE SHIELD

## 2015-07-05 LAB — GLUCOSE, CAPILLARY
GLUCOSE-CAPILLARY: 121 mg/dL — AB (ref 65–99)
GLUCOSE-CAPILLARY: 131 mg/dL — AB (ref 65–99)
GLUCOSE-CAPILLARY: 133 mg/dL — AB (ref 65–99)
GLUCOSE-CAPILLARY: 136 mg/dL — AB (ref 65–99)
GLUCOSE-CAPILLARY: 140 mg/dL — AB (ref 65–99)
GLUCOSE-CAPILLARY: 142 mg/dL — AB (ref 65–99)
GLUCOSE-CAPILLARY: 145 mg/dL — AB (ref 65–99)
GLUCOSE-CAPILLARY: 146 mg/dL — AB (ref 65–99)
GLUCOSE-CAPILLARY: 147 mg/dL — AB (ref 65–99)
GLUCOSE-CAPILLARY: 154 mg/dL — AB (ref 65–99)
GLUCOSE-CAPILLARY: 170 mg/dL — AB (ref 65–99)
GLUCOSE-CAPILLARY: 181 mg/dL — AB (ref 65–99)
GLUCOSE-CAPILLARY: 181 mg/dL — AB (ref 65–99)
GLUCOSE-CAPILLARY: 255 mg/dL — AB (ref 65–99)
Glucose-Capillary: 212 mg/dL — ABNORMAL HIGH (ref 65–99)
Glucose-Capillary: 192 mg/dL — ABNORMAL HIGH (ref 65–99)
Glucose-Capillary: 144 mg/dL — ABNORMAL HIGH (ref 65–99)
Glucose-Capillary: 131 mg/dL — ABNORMAL HIGH (ref 65–99)
Glucose-Capillary: 133 mg/dL — ABNORMAL HIGH (ref 65–99)
Glucose-Capillary: 140 mg/dL — ABNORMAL HIGH (ref 65–99)
Glucose-Capillary: 178 mg/dL — ABNORMAL HIGH (ref 65–99)
Glucose-Capillary: 179 mg/dL — ABNORMAL HIGH (ref 65–99)

## 2015-07-05 LAB — COMPREHENSIVE METABOLIC PANEL
ALT: 22 U/L (ref 17–63)
ANION GAP: 6 (ref 5–15)
AST: 27 U/L (ref 15–41)
Albumin: 2.8 g/dL — ABNORMAL LOW (ref 3.5–5.0)
Alkaline Phosphatase: 72 U/L (ref 38–126)
BILIRUBIN TOTAL: 1.1 mg/dL (ref 0.3–1.2)
BUN: 7 mg/dL (ref 6–20)
CHLORIDE: 107 mmol/L (ref 101–111)
CO2: 25 mmol/L (ref 22–32)
Calcium: 8.4 mg/dL — ABNORMAL LOW (ref 8.9–10.3)
Creatinine, Ser: 0.56 mg/dL — ABNORMAL LOW (ref 0.61–1.24)
Glucose, Bld: 158 mg/dL — ABNORMAL HIGH (ref 65–99)
POTASSIUM: 3.2 mmol/L — AB (ref 3.5–5.1)
Sodium: 138 mmol/L (ref 135–145)
TOTAL PROTEIN: 6.6 g/dL (ref 6.5–8.1)

## 2015-07-05 LAB — CBC
HEMATOCRIT: 40.2 % (ref 40.0–52.0)
HEMOGLOBIN: 13.7 g/dL (ref 13.0–18.0)
MCH: 31.8 pg (ref 26.0–34.0)
MCHC: 34.1 g/dL (ref 32.0–36.0)
MCV: 93.3 fL (ref 80.0–100.0)
Platelets: 160 10*3/uL (ref 150–440)
RBC: 4.3 MIL/uL — AB (ref 4.40–5.90)
RDW: 12.1 % (ref 11.5–14.5)
WBC: 11.5 10*3/uL — AB (ref 3.8–10.6)

## 2015-07-05 LAB — TRIGLYCERIDES
TRIGLYCERIDES: 1124 mg/dL — AB (ref <150)
Triglycerides: 912 mg/dL — ABNORMAL HIGH (ref <150)

## 2015-07-05 LAB — LIPASE, BLOOD: LIPASE: 24 U/L (ref 11–51)

## 2015-07-05 MED ORDER — HYDROCODONE-ACETAMINOPHEN 5-325 MG PO TABS
2.0000 | ORAL_TABLET | ORAL | Status: AC
  Administered 2015-07-05: 2 via ORAL
  Filled 2015-07-05: qty 2

## 2015-07-05 MED ORDER — SIMETHICONE 80 MG PO CHEW
160.0000 mg | CHEWABLE_TABLET | Freq: Four times a day (QID) | ORAL | Status: DC | PRN
  Administered 2015-07-05 – 2015-07-06 (×2): 160 mg via ORAL
  Filled 2015-07-05 (×4): qty 2

## 2015-07-05 MED ORDER — AMLODIPINE BESYLATE 5 MG PO TABS
5.0000 mg | ORAL_TABLET | Freq: Every day | ORAL | Status: DC
  Filled 2015-07-05: qty 1

## 2015-07-05 MED ORDER — HYDROMORPHONE HCL 1 MG/ML IJ SOLN: 1.0000 mg | INTRAMUSCULAR | Status: DC | PRN

## 2015-07-05 MED ORDER — HYDROMORPHONE HCL 1 MG/ML IJ SOLN
1.0000 mg | INTRAMUSCULAR | Status: DC | PRN
  Administered 2015-07-05 – 2015-07-08 (×15): 1 mg via INTRAVENOUS
  Filled 2015-07-05 (×15): qty 1

## 2015-07-05 MED ORDER — HYDRALAZINE HCL 20 MG/ML IJ SOLN: 10.0000 mg | Freq: Four times a day (QID) | INTRAMUSCULAR | Status: DC | PRN

## 2015-07-05 MED ORDER — HYDROCODONE-ACETAMINOPHEN 5-325 MG PO TABS
1.0000 | ORAL_TABLET | ORAL | Status: DC | PRN
  Administered 2015-07-05: 1 via ORAL
  Administered 2015-07-05 – 2015-07-09 (×14): 2 via ORAL
  Filled 2015-07-05 (×5): qty 2
  Filled 2015-07-05: qty 1
  Filled 2015-07-05 (×9): qty 2

## 2015-07-05 MED ORDER — ENOXAPARIN SODIUM 40 MG/0.4ML ~~LOC~~ SOLN
40.0000 mg | SUBCUTANEOUS | Status: DC
  Administered 2015-07-05 – 2015-07-08 (×4): 40 mg via SUBCUTANEOUS
  Filled 2015-07-05 (×5): qty 0.4

## 2015-07-05 MED ORDER — DOCUSATE SODIUM 100 MG PO CAPS
100.0000 mg | ORAL_CAPSULE | Freq: Two times a day (BID) | ORAL | Status: DC
  Administered 2015-07-05 – 2015-07-09 (×9): 100 mg via ORAL
  Filled 2015-07-05 (×9): qty 1

## 2015-07-05 MED ORDER — KCL IN DEXTROSE-NACL 20-5-0.9 MEQ/L-%-% IV SOLN
INTRAVENOUS | Status: DC
  Administered 2015-07-05 – 2015-07-06 (×4): via INTRAVENOUS
  Filled 2015-07-05 (×7): qty 1000

## 2015-07-05 MED ORDER — POTASSIUM CHLORIDE CRYS ER 20 MEQ PO TBCR
40.0000 meq | EXTENDED_RELEASE_TABLET | Freq: Once | ORAL | Status: AC
  Administered 2015-07-05: 40 meq via ORAL
  Filled 2015-07-05: qty 2

## 2015-07-05 MED ORDER — AMLODIPINE BESYLATE 10 MG PO TABS
10.0000 mg | ORAL_TABLET | Freq: Every day | ORAL | Status: DC
  Administered 2015-07-05 – 2015-07-09 (×5): 10 mg via ORAL
  Filled 2015-07-05 (×5): qty 1

## 2015-07-05 MED ORDER — AMLODIPINE BESYLATE 10 MG PO TABS: 10.0000 mg | ORAL_TABLET | Freq: Every day | ORAL | Status: DC

## 2015-07-05 MED ORDER — INSULIN STARTER KIT- PEN NEEDLES (ENGLISH)
1.0000 | Freq: Once | Status: AC
  Administered 2015-07-05: 1
  Filled 2015-07-05: qty 1

## 2015-07-05 MED ORDER — LIVING WELL WITH DIABETES BOOK
Freq: Once | Status: AC
  Administered 2015-07-05: 15:00:00
  Filled 2015-07-05 (×2): qty 1

## 2015-07-05 NOTE — Progress Notes (Addendum)
Upon return from radiology patient complained of pain and was drowsy at 1355.  PRN pain med can not be given until 1432 per time.   Diabetes coordinator, Gavin PoundDeborah, went in to speak with patient and came out and reported to this RN that patient was groggy but that patient did ask for something for pain.  RN went to patient's room and patient snoring and asleep.  Will continue to monitor.  Call bell beside patient in bed at hands reach.

## 2015-07-05 NOTE — Progress Notes (Signed)
Off unit for xray with Pam, orderly. Patient on tele monitor.

## 2015-07-05 NOTE — Progress Notes (Signed)
[facility] - [place] at [facility]   PATIENT NAME: Donald Hart    MR#:  [id number]  DATE OF BIRTH:  1970-09-24  SUBJECTIVE:  CHIEF COMPLAINT:   Chief Complaint  Patient presents with  . Abdominal Pain   - Patient more alert today. Complaining of abdominal pain, diffuse but worse in the right lower quadrant. -Denies any nausea or vomiting. Family concerned about constipation. -Triglycerides down to 1100. Remains on insulin drip.  REVIEW OF SYSTEMS:  Review of Systems  Constitutional: Positive for malaise/fatigue. Negative for fever and chills.  HENT: Negative for ear discharge, ear pain and nosebleeds.   Eyes: Negative for blurred vision.  Respiratory: Negative for cough, shortness of breath and wheezing.   Cardiovascular: Negative for chest pain and palpitations.  Gastrointestinal: Positive for heartburn, abdominal pain and constipation. Negative for nausea, vomiting and diarrhea.  Genitourinary: Negative for dysuria.  Musculoskeletal: Negative for myalgias.  Skin: Negative for rash.  Neurological: Negative for dizziness, tremors, speech change, focal weakness, seizures and headaches.  Psychiatric/Behavioral: Negative for depression.    DRUG ALLERGIES:  No Known Allergies  VITALS:  Blood pressure 160/90, pulse 87, temperature 98.6 F (37 C), temperature source Oral, resp. rate 14, height 6\' 1"  (1.854 m), weight 110.2 kg (242 lb 15.2 oz), SpO2 99 %.  PHYSICAL EXAMINATION:  Physical Exam  GENERAL:  45 y.o.-year-old obese patient lying in the bed and appears to be in distress secondary to abdominal pain  EYES: Pupils equal, round, [name], normocephalic. Oropharynx and nasopharynx clear.  NECK:  Supple, [name], [name], no wheezing, rales,rhonchi or  crepitation. No use of accessory muscles of respiration.  CARDIOVASCULAR: S1, [name], rubs, or gallops.  ABDOMEN: firm, distended, generalized tenderness, worse in the right lower quadrant, no guarding or rigidity noted. Bowel sounds are hypoactive. No organomegaly or mass.  EXTREMITIES: No pedal edema, cyanosis, or clubbing.  NEUROLOGIC: Cranial nerves II through XII are intact. Muscle strength 5/5 in all extremities. Sensation intact. Gait not checked.  PSYCHIATRIC: The patient is alert and oriented x 3.  SKIN: No obvious rash, lesion, or ulcer.    LABORATORY PANEL:   CBC  Recent Labs Lab 07/05/15 0527  WBC 11.5*  HGB 13.7  HCT 40.2  PLT 160   ------------------------------------------------------------------------------------------------------------------  Chemistries   Recent Labs Lab 07/05/15 0527  NA 138  K 3.2*  CL 107  CO2 25  GLUCOSE 158*  BUN 7  CREATININE 0.56*  CALCIUM 8.4*  AST 27  ALT 22  ALKPHOS 72  BILITOT 1.1   ------------------------------------------------------------------------------------------------------------------  Cardiac Enzymes No results for input(s): TROPONINI in the last 168 hours. ------------------------------------------------------------------------------------------------------------------  RADIOLOGY:  Ct Abdomen Pelvis W Contrast  07/03/2015  CLINICAL DATA:  45 year old male with right abdominal and pelvic pain with nausea and vomiting for 3 days. EXAM: CT ABDOMEN AND PELVIS WITH CONTRAST TECHNIQUE: Multidetector CT imaging of the abdomen and pelvis was performed using the standard protocol following bolus administration of intravenous contrast. CONTRAST:  OMNIPAQUE IOHEXOL 350 MG/ML SOLN COMPARISON:  None. FINDINGS: Lower chest:  Minimal right basilar atelectasis noted. Hepatobiliary: Hepatic steatosis present. No focal hepatic abnormalities are identified. The gallbladder is unremarkable. Pancreas:  Inflammation along the pancreatic head/uncinate process and transverse duodenum noted. The pancreas is otherwise unremarkable. No hemorrhage, pancreatic necrosis or venous thrombosis. Spleen: Unremarkable Adrenals/Urinary Tract:  The kidneys, adrenal glands and bladder are unremarkable. Stomach/Bowel: Mild wall thickening of the transverse duodenum noted with mild adjacent inflammation. There is no evidence of bowel obstruction. The appendix is normal. Vascular/Lymphatic: No enlarged lymph nodes or abdominal aortic aneurysm. Aortic atherosclerotic calcifications are noted. Reproductive: Unremarkable Other: No free fluid, abscess or pneumoperitoneum. Small to moderate bilateral inguinal hernias containing fat are noted. Musculoskeletal: No acute or suspicious abnormalities. IMPRESSION: Inflammation adjacent to the head/uncinate process of the pancreas and transverse duodenum. This may represent pancreatitis or duodenal inflammation/process. No evidence of abscess, pneumoperitoneum or bowel obstruction. Hepatic steatosis. Small to moderate bilateral inguinal hernias containing fat. Aortic atherosclerosis. Electronically Signed   By: Harmon PierJeffrey  Hu M.D.   On: 07/03/2015 13:27    EKG:   Orders placed or performed during the hospital encounter of 07/03/15  . ED EKG  . ED EKG  . EKG 12-Lead  . EKG 12-Lead  . EKG 12-Lead  . EKG 12-Lead    ASSESSMENT AND PLAN:   44y/o obese male with PMH significant for Hypertension, hyperlipidemia and alcohol abuse admitted for acute pancreatitis  #1 Acute pancreatitis- alcohol induced and also hypertriglyceridemia - TG level>4000 on admission- apheresis not available here - on insulin drip and also D5 drip - Endocrinology consult to help with insulin drip. - continue insulin drip until TG levels<500 - continue statin and gemfibrozil added - Also an alcoholic - CT abd with no gall stones - monitor lipase - cont NPO, IV fluids for now. Pain meds adjusted -Follow up  KUB today with history of constipation.  #2 Leukocytosis- stress reaction likely. - On Zosyn, can be discontinued today. - no source of infec identified  #3 Hypokalemia- likely from insulin drip- being replaced  #4 new-onset diabetes mellitus-A1c is 10.6. -Currently on insulin drip due to hypertriglyceridemia. -And triglycerides are less than 500, transition to subcutaneous insulin and start oral medications. -Appreciate endocrinology consult  #5 Alcohol abuse disorder- monitor for any withdrawals On CIWA protocol  #6 HTN- coreg. Blood pressure is elevated. Partly from the withdrawals. Restart Norvasc, at a lower dose today  #7 Hyperlipidemia- on statin and gemfibrozil added  #8 DVT Prophylaxis- SQ Heparin   All the records are reviewed and case discussed with Care Management/Social Workerr. Management plans discussed with the patient, family and they are in agreement.  CODE STATUS: Full Code  TOTAL CRITICAL CARE TIME SPENT IN TAKING CARE OF THIS PATIENT: 40 minutes.   POSSIBLE D/C IN 3 DAYS, DEPENDING ON CLINICAL CONDITION.   Enid BaasKALISETTI,Danil Wedge M.D on 07/05/2015 at 1:12 PM  Between 7am to 6pm - Pager - [phone]  After 6pm go to [email or web address] - password [email or web address] [facility]  [facility]  Office  [phone]  CC: Primary care physician; Dione Housekeeperlmedo, Mario Ernesto, MD

## 2015-07-05 NOTE — Progress Notes (Signed)
RN spoke with Dr. Nemiah CommanderKalisetti and made MD aware that patient is complaining of pain and that he would like to talk to his doctor about his pain control. MD acknowledged.

## 2015-07-05 NOTE — Progress Notes (Signed)
RN notified Dr. Nemiah CommanderKalisetti that patient is calling out on call bell for pain medicine and moaning and that at this time per PRN order RN can only give one tablet of norco and that patient is complaining of not getting good pain relief from meds.  RN also explained to MD that patient previously was groggy and asked for pain medicine but went back to sleep.  MD stated she would place orders for one time pain med, colace and mylicon.

## 2015-07-05 NOTE — Progress Notes (Signed)
Upon pain reassessment at 1616, patient snoring while asleep. Aroused to voice and reported pain has improved.  RN gave patient PRN gas med.  Will continue to monitor.

## 2015-07-05 NOTE — Progress Notes (Signed)
Went to talk with patient regarding new DM dx and insulin. Patient was asleep when I entered room and his name was called several times before he barely opened his eyes. Began talking with patient and he continuously drifted back off to sleep, was very groggy, and had to be called by name several times before engaging again in conversation. Patient reported that he has an annual health assessment at work and he was told that he had prediabetes. Inquired whether an A1C was checked and he reported that he thought that is what they checked and his was always over 7% but he was never given DM dx. Patient stated that he was adopted by his mother's aunt and uncle when he was 73 years old and there are several family members that have diabetes. Patient has not received any educational material on diabetes yet. Informed patient he would be receiving a Living Well with Diabetes booklet and an insulin starter kit. Asked patient to read over the material once he receives it and is more alert. Informed patient that diabetes coordinator will come back tomorrow to talk with him. Patient verbalized understanding and asked that I let his nurse know that he needs more pain medication. Patient was asleep again before I left the room. Talked with Gari Crown, RN regarding patient's request about pain medication. Also asked that once booklet and insulin starter kit are received from pharmacy if she can provide to patient and encourage him to read over the information. Diabetes Coordinator will follow up with patient on 07/06/15.  Thanks, Barnie Alderman, RN, MSN, CDE Diabetes Coordinator Inpatient Diabetes Program [phone] (Team Pager from [place] to [place]) [phone] ([facility] office) [phone] [facility] office) [phone] [facility] office)

## 2015-07-05 NOTE — Care Management CHF Note (Signed)
Patient placed in icu on insulin drip due to elevated triglycerides of > 100.  He is also diagnosed with acute pancreatitis.  He is on CIWA protocol.  he drinks a case of beer a day.  Score 0 on ciwa

## 2015-07-05 NOTE — Progress Notes (Signed)
RN spoke with Dr. Nemiah CommanderKalisetti regarding patient's pain control and made her aware that patient was given 1 percocet 5/325mg  at 1032 and called out complaining of pain at 1120.  RN made MD aware that patient was also given 1mg  PO ativan at 1032 and that patient can have IV dilaudid PRN at this time but that RN feels cautious about giving it since it has been not quite an hour since ativan and percocet were given. Dr. Nemiah CommanderKalisetti stated she was changing pain med orders and ordering percocet 1-2mg  q4H prn and dilaudid 1mg  q4H prn.  RN will explain to patient.

## 2015-07-05 NOTE — Progress Notes (Signed)
RN spoke with Dr. Mack HookKallisetti on the phone after paging her and nurse made MD aware that patient complains of pain to abdomen 7/10 on pain scale.  IV dilaudid given at 0810 and patient complaining of pain at 0920.  Percocet given at 1032 and patient states he is not getting relief from pain.  Dr. Nemiah CommanderKalisetti gave no new orders and stated she is coming to ICU to see patient. PRN pain meds being given per PRN time frame.

## 2015-07-05 NOTE — Progress Notes (Signed)
Patient A&Ox4, engaging in conversation with nurse.  RN gave patient one time dose of  Norco, 2 tablets per order.  Will continue to monitor.

## 2015-07-05 NOTE — Progress Notes (Signed)
Endocrinology follow up:   Consult for: hypertriglyceridemia and related pancreatitis  S:  Donald Hart is a 45 y.o. male admitted with acute pancreatitis thought to be induced by alcohol abuse and hypertriglyceridemia. Triglyceride level was 4000+ upon admission. This was his first ever episode of acute pancreatitis. He reports recently learning of a lipid problem. He was taking a fibrate at home. He has had Pre-diabetes but denies history of type 2 diabetes. Hb A1c is 10.6%. Endocrinology consulted for further management.   He remains NPO. Reports pain control is poor today. He denies nausea or vomiting. No hypoglycemia events. Insulin infusion was changed from a set rate to titration per glucose stabilizer protocol.   O:  Filed Vitals:   07/05/15 1000 07/05/15 1200 07/05/15 1223 07/05/15 1241  BP: 166/104 160/90    Pulse: 73 90  87  Temp:   98.6 F (37 C)   TempSrc:   Oral   Resp: 14 12  14   Height:      Weight:      SpO2: 99% 99%  99%    Physical Exam: Gen: ill-appearing, uncomfortable,, well-nourished, obese  HEENT: [place]/AT, eyes anicteric, EOMI, mucous membranes dry, no oropharyngeal lesions CAD: tachycardic, regular rhythm.  PULM: breathing unlabored on room air GI: distended, tender to palpation EXT: no clubbing, cyanosis or edema  Skin: warm, dry, no rash Neuro: grossly non focal, normal DTRs, alert and oriented x 3  Labs:   CBC Latest Ref Rng 07/05/2015 07/04/2015 07/03/2015  WBC 3.8 - 10.6 K/uL 11.5(H) 16.2(H) 26.5(H)  Hemoglobin 13.0 - 18.0 g/dL 16.113.7 09.613.6 04.515.6  Hematocrit 40.0 - 52.0 % 40.2 39.8(L) 44.3  Platelets 150 - 440 K/uL 160 149(L) 207   BMP Latest Ref Rng 07/05/2015 07/04/2015 07/03/2015  Glucose 65 - 99 mg/dL 409(W158(H) 119(J135(H) UNABLE TO REPORT DUE TO LIPEMIC INTERFERENCE  BUN 6 - 20 mg/dL 7 9 9   Creatinine 0.61 - 1.24 mg/dL 4.78(G0.56(L) 9.560.61 2.13(Y0.60(L)  Sodium 135 - 145 mmol/L 138 136 UNABLE TO REPORT DUE TO LIPEMIC INTERFERENCE  Potassium 3.5 - 5.1 mmol/L 3.2(L) 3.2(L)  UNABLE TO REPORT DUE TO LIPEMIC INTERFERENCE  Chloride 101 - 111 mmol/L 107 103 90(L)  CO2 22 - 32 mmol/L 25 26 UNABLE TO REPORT DUE TO LIPEMIC INTERFERENCE  Calcium 8.9 - 10.3 mg/dL 8.6(V8.4(L) 7.8(I8.5(L) UNABLE TO REPORT DUE TO LIPEMIC INTERFERENCE   Component     Latest Ref Rng 07/03/2015 07/04/2015 07/04/2015 07/04/2015 07/05/2015          4:24 AM  8:45 AM  8:45 PM   Triglycerides     <150 mg/dL [id number] (H) [id number] (H) [id number] (H) 1314 (H) 1124 (H)   Blood glucose values reviewed in glucose accordion view  ASSESSMENT:  1. Hypertriglyceridemia and related pancreatitis 2. New-onset type 2 DM  RECOMMENDATIONS:   Pt should remain NPO, IVF Continue IV insulin infusion, titrate per glucose stabilizer non-DKA protocol. Maintain BG 140-180. Hourly glucometer checks. Check triglyceride levels daily. Once closer to 500 range, can transition for IV to subQ insulin.  Continue gemfibrozil.   Pain control and IV antibiotics per primary team. Will follow along closely and arrange follow up post-discharge. Thank you for this consult.  Doylene CanningAbby Abisogun, MD [facility]

## 2015-07-05 NOTE — Progress Notes (Signed)
Patient has been sleeping for past couple hours. Easily arouses to voice.  Reports pain is controlled better at this time rating pain 4/10. Report given to Amada Jupiterale, RN at this time.

## 2015-07-05 NOTE — Progress Notes (Signed)
ANTIBIOTIC CONSULT NOTE - INITIAL  Pharmacy Consult for Zosyn Dosing Indication: Intra-Abdominal Infection  No Known Allergies  Patient Measurements: Height: 6' 1"  (185.4 cm) Weight: 242 lb 15.2 oz (110.2 kg) IBW/kg (Calculated) : 79.9 Adjusted Body Weight: 91.5 kg  Vital Signs: Temp: 98.6 F (37 C) (01/04 0808) Temp Source: Oral (01/04 0808) BP: 166/104 mmHg (01/04 1000) Pulse Rate: 73 (01/04 1000) Intake/Output from previous day: 01/03 0701 - 01/04 0700 In: 4332.7 [I.V.:3782.7; IV Piggyback:550] Out: 7622 [Urine:1225] Intake/Output from this shift: Total I/O In: 618.9 [I.V.:618.9] Out: 700 [Urine:700]  Labs:  Recent Labs  07/03/15 1103 07/04/15 0424 07/05/15 0527  WBC 26.5* 16.2* 11.5*  HGB 15.6 13.6 13.7  PLT 207 149* 160  CREATININE 0.60* 0.61 0.56*   Estimated Creatinine Clearance: 156.5 mL/min (by C-G formula based on Cr of 0.56). No results for input(s): VANCOTROUGH, VANCOPEAK, VANCORANDOM, GENTTROUGH, GENTPEAK, GENTRANDOM, TOBRATROUGH, TOBRAPEAK, TOBRARND, AMIKACINPEAK, AMIKACINTROU, AMIKACIN in the last 72 hours.   Microbiology: Recent Results (from the past 720 hour(s))  MRSA PCR Screening     Status: None   Collection Time: 07/03/15  5:40 PM  Result Value Ref Range Status   MRSA by PCR NEGATIVE NEGATIVE Final    Comment:        The GeneXpert MRSA Assay (FDA approved for NASAL specimens only), is one component of a comprehensive MRSA colonization surveillance program. It is not intended to diagnose MRSA infection nor to guide or monitor treatment for MRSA infections.     Medical History: Past Medical History  Diagnosis Date  . Hypertension   . Gout   . Hyperlipidemia   . Alcohol abuse     Medications:  Scheduled:  . allopurinol  200 mg Oral Daily  . carvedilol  6.25 mg Oral BID  . folic acid  1 mg Oral Daily  . gemfibrozil  600 mg Oral BID AC  . heparin  5,000 Units Subcutaneous 3 times per day  . insulin regular  0-10 Units  Intravenous TID WC  . insulin starter kit- pen needles  1 kit Other Once  . living well with diabetes book   Does not apply Once  . multivitamin with minerals  1 tablet Oral Daily  . nicotine  21 mg Transdermal Daily  . pantoprazole  40 mg Oral Daily  . piperacillin-tazobactam (ZOSYN)  IV  3.375 g Intravenous 3 times per day  . pregabalin  75 mg Oral TID  . sodium chloride  3 mL Intravenous Q12H  . thiamine IV  100 mg Intravenous Daily   Infusions:  . dextrose 5 % and 0.9% NaCl 150 mL/hr at 07/05/15 0913  . insulin (NOVOLIN-R) infusion 4.8 Units/hr (07/05/15 1110)   Assessment: Pharmacy consulted to dose Zosyn in a 45 yo male with acute pancreatitis.  Plan:  Will continue Zosyn 3.375 gm IV q8h. Will f/u need for abx with rounding MD.    Pharmacy will continue to follow.  Ulice Dash, PharmD  Clinical Pharmacist 07/05/2015

## 2015-07-06 LAB — BASIC METABOLIC PANEL
ANION GAP: 7 (ref 5–15)
CALCIUM: 8.9 mg/dL (ref 8.9–10.3)
CHLORIDE: 106 mmol/L (ref 101–111)
CO2: 25 mmol/L (ref 22–32)
CREATININE: 0.5 mg/dL — AB (ref 0.61–1.24)
GFR calc non Af Amer: >60 mL/min (ref >60)
Glucose, Bld: 150 mg/dL — ABNORMAL HIGH (ref 65–99)
Potassium: 3.5 mmol/L (ref 3.5–5.1)
Sodium: 138 mmol/L (ref 135–145)

## 2015-07-06 LAB — TRIGLYCERIDES: TRIGLYCERIDES: 839 mg/dL — AB (ref <150)

## 2015-07-06 LAB — GLUCOSE, CAPILLARY
GLUCOSE-CAPILLARY: 285 mg/dL — AB (ref 65–99)
GLUCOSE-CAPILLARY: 173 mg/dL — AB (ref 65–99)
GLUCOSE-CAPILLARY: 147 mg/dL — AB (ref 65–99)
GLUCOSE-CAPILLARY: 141 mg/dL — AB (ref 65–99)
GLUCOSE-CAPILLARY: 136 mg/dL — AB (ref 65–99)
GLUCOSE-CAPILLARY: 145 mg/dL — AB (ref 65–99)
GLUCOSE-CAPILLARY: 142 mg/dL — AB (ref 65–99)
GLUCOSE-CAPILLARY: 165 mg/dL — AB (ref 65–99)
GLUCOSE-CAPILLARY: 151 mg/dL — AB (ref 65–99)
Glucose-Capillary: 138 mg/dL — ABNORMAL HIGH (ref 65–99)
Glucose-Capillary: 148 mg/dL — ABNORMAL HIGH (ref 65–99)
Glucose-Capillary: 157 mg/dL — ABNORMAL HIGH (ref 65–99)
Glucose-Capillary: 162 mg/dL — ABNORMAL HIGH (ref 65–99)
Glucose-Capillary: 171 mg/dL — ABNORMAL HIGH (ref 65–99)
Glucose-Capillary: 172 mg/dL — ABNORMAL HIGH (ref 65–99)
Glucose-Capillary: 172 mg/dL — ABNORMAL HIGH (ref 65–99)
Glucose-Capillary: 202 mg/dL — ABNORMAL HIGH (ref 65–99)

## 2015-07-06 LAB — LIPASE, BLOOD: LIPASE: 26 U/L (ref 11–51)

## 2015-07-06 MED ORDER — VITAMIN B-1 100 MG PO TABS
100.0000 mg | ORAL_TABLET | Freq: Every day | ORAL | Status: DC
  Administered 2015-07-07 – 2015-07-09 (×3): 100 mg via ORAL
  Filled 2015-07-06 (×3): qty 1

## 2015-07-06 NOTE — Progress Notes (Signed)
   07/06/15 1100  Clinical Encounter Type  Visited With Patient and family together  Visit Type Initial  Consult/Referral To Chaplain  Spiritual Encounters  Spiritual Needs Emotional  Stress Factors  Patient Stress Factors Not reviewed  Family Stress Factors None identified  Chaplain rounded in the unit and offered a compassionate presence and support to patient and family. No need was identified at this time. Chaplain Sage Hammill A. Riely Baskett Ext. [phone]

## 2015-07-06 NOTE — Progress Notes (Signed)
Endocrinology follow up:   Consult for: hypertriglyceridemia and related pancreatitis  S:  Donald Hart is a 45 y.o. male admitted with acute pancreatitis thought to be induced by alcohol abuse and hypertriglyceridemia. Triglyceride level was 4000+ upon admission. This was his first ever episode of acute pancreatitis. He reports recently learning of a lipid problem. He was taking a fibrate at home. He has had Pre-diabetes but denies history of type 2 diabetes. Hb A1c is 10.6%. Endocrinology consulted for further management.   He remains NPO. Reports pain is more pain controlled today. He denies nausea or vomiting. No hypoglycemia events. Insulin infusion continues. Triglycerides now in 800 range.   O:  Filed Vitals:   07/06/15 1040 07/06/15 1100 07/06/15 1200 07/06/15 1227  BP:  134/87 160/108   Pulse: 65 65 76 88  Temp:   98.5 F (36.9 C)   TempSrc:   Oral   Resp: 19 16 14 12   Height:      Weight:      SpO2: 98% 96% 100% 99%    Physical Exam: Gen: ill-appearing, uncomfortable,, well-nourished, obese  HEENT: [place]/AT, eyes anicteric, EOMI, mucous membranes dry, no oropharyngeal lesions CAD: tachycardic, regular rhythm.  PULM: breathing unlabored on room air GI: distended, nontender to gentle palpation EXT: no clubbing, cyanosis or edema  Skin: warm, dry, no rash Neuro: grossly non focal, normal DTRs, alert and oriented x 3  Labs:   CBC Latest Ref Rng 07/05/2015 07/04/2015 07/03/2015  WBC 3.8 - 10.6 K/uL 11.5(H) 16.2(H) 26.5(H)  Hemoglobin 13.0 - 18.0 g/dL 16.113.7 09.613.6 04.515.6  Hematocrit 40.0 - 52.0 % 40.2 39.8(L) 44.3  Platelets 150 - 440 K/uL 160 149(L) 207   BMP Latest Ref Rng 07/06/2015 07/05/2015 07/04/2015  Glucose 65 - 99 mg/dL 409(W150(H) 119(J158(H) 478(G135(H)  BUN 6 - 20 mg/dL <9(F<5(L) 7 9  Creatinine 6.210.61 - 1.24 mg/dL 3.08(M0.50(L) 5.78(I0.56(L) 6.960.61  Sodium 135 - 145 mmol/L 138 138 136  Potassium 3.5 - 5.1 mmol/L 3.5 3.2(L) 3.2(L)  Chloride 101 - 111 mmol/L 106 107 103  CO2 22 - 32 mmol/L 25 25 26    Calcium 8.9 - 10.3 mg/dL 8.9 2.9(B8.4(L) 2.8(U8.5(L)   Component     Latest Ref Rng 07/03/2015 07/04/2015 07/04/2015 07/04/2015 07/05/2015          4:24 AM  8:45 AM  8:45 PM  5:27 AM  Triglycerides     <150 mg/dL 13244037 (H) 40101995 (H) 27251771 (H) 1314 (H) 1124 (H)   Component     Latest Ref Rng 07/05/2015 07/06/2015         9:06 PM   Triglycerides     <150 mg/dL 366912 (H) 440839 (H)    Blood glucose values reviewed in glucose accordion view  ASSESSMENT:  1. Hypertriglyceridemia and related pancreatitis 2. New-onset type 2 DM  RECOMMENDATIONS:   Pt should remain NPO, cont IVF Continue IV insulin infusion, titrate per glucose stabilizer non-DKA protocol. Maintain BG 140-180. Hourly glucometer checks. Trend triglyceride levels daily. Once closer to 500 range, can transition from IV to subQ insulin.  Anticipate insulin transition can be done tomorrow. Continue gemfibrozil.   Pain control and IV antibiotics per primary team. Will follow along closely and arrange follow up post-discharge. Thank you for this consult.  Doylene CanningAbby Abisogun, MD [facility]

## 2015-07-06 NOTE — Progress Notes (Signed)
PHARMACIST - PHYSICIAN COMMUNICATION CONCERNING: Antibiotic IV to Oral Route Change Policy  RECOMMENDATION: This patient is receiving thiamine by the intravenous route.  Based on criteria approved by the Pharmacy and Therapeutics Committee, the antibiotic(s) is/are being converted to the equivalent oral dose form(s).   DESCRIPTION: These criteria include:  Patient being treated for a respiratory tract infection, urinary tract infection, cellulitis or clostridium difficile associated diarrhea if on metronidazole  The patient is not neutropenic and does not exhibit a GI malabsorption state  The patient is eating (either orally or via tube) and/or has been taking other orally administered medications for a least 24 hours  The patient is improving clinically and has a Tmax < 100.5  If you have questions about this conversion, please contact the Pharmacy Department  []   [phone]( [phone] )  [facility] [x]   [phone]( [phone] )  [facility] []   [phone]( [phone] )  [facility] []   [phone]( [phone] )  [facility] []   [phone]( [phone] )  [facility]

## 2015-07-06 NOTE — Progress Notes (Signed)
Brief Nutrition Note:   Dietitian Consult received for diet education; RD already following pt. Pt remains NPO, continues with poorly controlled abdominal pain and tends to be groggy post pain medication. Will continue to follow and provide diet education once diet advanced and pt more appropriate for education.   Romelle Starcherate Binyamin Nelis MS, RD, LDN [phone] Pager  [phone] Weekend/On-Call Pager

## 2015-07-06 NOTE — Progress Notes (Signed)
Inpatient Diabetes Program Recommendations  AACE/ADA: New Consensus Statement on Inpatient Glycemic Control (2015)  Target Ranges:  Prepandial:   less than 140 mg/dL      Peak postprandial:   less than 180 mg/dL (1-2 hours)      Critically ill patients:  140 - 180 mg/dL   Review of Glycemic Control  Diabetes history: none Outpatient Diabetes medications: none Current orders for Inpatient glycemic control: IV insulin via Pinch  Inpatient Diabetes Program Recommendations: Met with patient at the  bedside.  He was in a fair amount of pain and had just been given Dilaudid before I arrived.  Awake but moaning- RN [place] suggested I follow up with patient at a later time.  Diabetes teaching material at the bedside but RN indicates it has not been started because patient has not been feeling well enough.  Will follow.   Gentry Fitz, RN, BA, MHA, CDE Diabetes Coordinator Inpatient Diabetes Program  [phone] (Team Pager) [phone] ([place]) 07/06/2015 12:35 PM

## 2015-07-06 NOTE — Progress Notes (Signed)
[facility] - [name]   PATIENT NAME: Donald Hart    MR#:  [id number]  DATE OF BIRTH:  [birth year]  SUBJECTIVE:  CHIEF COMPLAINT:   Chief Complaint  Patient presents with  . Abdominal Pain   Continued abdominal pain. Getting restless  REVIEW OF SYSTEMS:  Review of Systems  Constitutional: Positive for malaise/fatigue. Negative for fever and chills.  HENT: Negative for ear discharge, ear pain and nosebleeds.   Eyes: Negative for blurred vision.  Respiratory: Negative for cough, shortness of breath and wheezing.   Cardiovascular: Negative for chest pain and palpitations.  Gastrointestinal: Positive for heartburn, abdominal pain and constipation. Negative for nausea, vomiting and diarrhea.  Genitourinary: Negative for dysuria.  Musculoskeletal: Negative for myalgias.  Skin: Negative for rash.  Neurological: Negative for dizziness, tremors, speech change, focal weakness, seizures and headaches.  Psychiatric/Behavioral: Negative for depression.    DRUG ALLERGIES:  No Known Allergies  VITALS:  Blood pressure 155/88, pulse 80, temperature 98.2 F (36.8 C), temperature source Oral, resp. rate 13, height 6\' 1"  (1.854 m), weight 110.2 kg (242 lb 15.2 oz), SpO2 99 %.  PHYSICAL EXAMINATION:  Physical Exam  GENERAL:  45 y.o.-year-old obese patient lying in the bed no acute distress  EYES: Pupils equal, round, [name], normocephalic. Oropharynx and nasopharynx clear.  NECK:  Supple, [name], [name], no wheezing, rales,[name], [name], rubs, or gallops.  ABDOMEN: firm, distended, generalized tenderness, worse in bilateral upper quadrants, no guarding or  rigidity noted. Bowel sounds are hypoactive. No organomegaly or mass.  EXTREMITIES: No pedal edema, cyanosis, or clubbing.  NEUROLOGIC: Cranial nerves II through XII are intact. Muscle strength 5/5 in all extremities. Sensation intact. Gait not checked.  PSYCHIATRIC: The patient is alert and oriented x 3.  SKIN: No obvious rash, lesion, or ulcer.    LABORATORY PANEL:   CBC  Recent Labs Lab 07/05/15 0527  WBC 11.5*  HGB 13.7  HCT 40.2  PLT 160   ------------------------------------------------------------------------------------------------------------------  Chemistries   Recent Labs Lab 07/05/15 0527 07/06/15 0631  NA 138 138  K 3.2* 3.5  CL 107 106  CO2 25 25  GLUCOSE 158* 150*  BUN 7 <5*  CREATININE 0.56* 0.50*  CALCIUM 8.4* 8.9  AST 27  --   ALT 22  --   ALKPHOS 72  --   BILITOT 1.1  --    ------------------------------------------------------------------------------------------------------------------  Cardiac Enzymes No results for input(s): TROPONINI in the last 168 hours. ------------------------------------------------------------------------------------------------------------------  RADIOLOGY:  Dg Abd 1 View  07/05/2015  CLINICAL DATA:  45 year old male with severe abdominal pain greater on the right side. Pancreatic/retroperitoneal inflammation on recent CT Abdomen and Pelvis. Initial encounter. EXAM: ABDOMEN - 1 VIEW COMPARISON:  CT Abdomen and Pelvis 07/03/2015, and earlier FINDINGS: Supine views of the abdomen and pelvis. CT oral contrast administered 2 days ago is now in the right colon. Non obstructed bowel gas pattern. Increased small bowel gas. No definite pneumoperitoneum on these supine views. Widespread thoracic spine endplate degeneration. No acute osseous abnormality identified. IMPRESSION: Non obstructed bowel gas pattern, with interval transit of oral contrast to the colon. Still, increased gas containing small bowel loops since the recent CT,  might be related to reactive ileus in this setting. Electronically Signed  By: Donald FlemingH  Donald M.D.   On: 07/05/2015 13:45    EKG:   Orders placed or performed during the hospital encounter of 07/03/15  . ED EKG  . ED EKG  . EKG 12-Lead  . EKG 12-Lead  . EKG 12-Lead  . EKG 12-Lead    ASSESSMENT AND PLAN:   45y/o obese male with PMH significant for Hypertension, hyperlipidemia and alcohol abuse admitted for acute pancreatitis  #1 Acute pancreatitis- alcohol induced and also hypertriglyceridemia - TG level>4000 on admission- apheresis not available here - on insulin drip and also D5 drip - Endocrinology consult to help with insulin drip. - continue insulin drip until TG levels<500, anticipate this will happen tomorrow - continue statin and gemfibrozil added - Also an alcoholic - CT abd with no gall stones - monitor lipase - cont NPO, IV fluids for now. Pain meds adjusted  #2 Leukocytosis- stress reaction likely. - no source of infec identified, no antibiotics needed  #3 Hypokalemia- likely from insulin drip- being replaced and monitored  #4 new-onset diabetes mellitus-A1c is 10.6. -Currently on insulin drip due to hypertriglyceridemia. -And triglycerides are less than 500, transition to subcutaneous insulin and start oral medications. -Appreciate endocrinology consult, will need outpatient follow-up  #5 Alcohol abuse disorder- monitor for any withdrawals On CIWA protocol  #6 HTN- coreg. Blood pressure is elevated. Partly from the withdrawals. Restart Norvasc, at a lower dose today  #7 Hyperlipidemia- on statin and gemfibrozil added  #8 DVT Prophylaxis- SQ Heparin   All the records are reviewed and case discussed with Care Management/Social Workerr. Management plans discussed with the patient, family and they are in agreement. Care plan discussed with the patient and his wife at the bedside  CODE STATUS: Full Code  TOTAL CRITICAL CARE TIME SPENT IN TAKING CARE OF THIS  PATIENT: 40 minutes.   POSSIBLE D/C IN 3 DAYS, DEPENDING ON CLINICAL CONDITION.   Donald Hart, [place] Pulse M.D on 07/06/2015 at 5:34 PM  Between 7am to 6pm - Pager - [phone] After 6pm go to [email or web address] - password [email or web address] [facility]  [facility]  Office  [phone]  CC: Primary care physician; Dione Housekeeperlmedo, Mario Ernesto, MD

## 2015-07-07 ENCOUNTER — Inpatient Hospital Stay: Payer: BLUE CROSS/BLUE SHIELD

## 2015-07-07 ENCOUNTER — Encounter: Payer: Self-pay | Admitting: Internal Medicine

## 2015-07-07 DIAGNOSIS — F1010 Alcohol abuse, uncomplicated: Secondary | ICD-10-CM | POA: Diagnosis present

## 2015-07-07 LAB — COMPREHENSIVE METABOLIC PANEL
ALT: 25 U/L (ref 17–63)
AST: 27 U/L (ref 15–41)
Albumin: 3 g/dL — ABNORMAL LOW (ref 3.5–5.0)
Alkaline Phosphatase: 76 U/L (ref 38–126)
Anion gap: 6 (ref 5–15)
BUN: 5 mg/dL — ABNORMAL LOW (ref 6–20)
CHLORIDE: 105 mmol/L (ref 101–111)
CO2: 25 mmol/L (ref 22–32)
CREATININE: 0.51 mg/dL — AB (ref 0.61–1.24)
Calcium: 8.9 mg/dL (ref 8.9–10.3)
GFR calc non Af Amer: >60 mL/min (ref >60)
Glucose, Bld: 169 mg/dL — ABNORMAL HIGH (ref 65–99)
POTASSIUM: 3.6 mmol/L (ref 3.5–5.1)
SODIUM: 136 mmol/L (ref 135–145)
Total Bilirubin: 0.9 mg/dL (ref 0.3–1.2)
Total Protein: 6.9 g/dL (ref 6.5–8.1)

## 2015-07-07 LAB — CBC
HCT: 43.2 % (ref 40.0–52.0)
Hemoglobin: 14.8 g/dL (ref 13.0–18.0)
MCH: 32.6 pg (ref 26.0–34.0)
MCHC: 34.2 g/dL (ref 32.0–36.0)
MCV: 95.3 fL (ref 80.0–100.0)
PLATELETS: 193 10*3/uL (ref 150–440)
RBC: 4.54 MIL/uL (ref 4.40–5.90)
RDW: 12.1 % (ref 11.5–14.5)
WBC: 10.5 10*3/uL (ref 3.8–10.6)

## 2015-07-07 LAB — GLUCOSE, CAPILLARY
GLUCOSE-CAPILLARY: 167 mg/dL — AB (ref 65–99)
GLUCOSE-CAPILLARY: 189 mg/dL — AB (ref 65–99)
GLUCOSE-CAPILLARY: 162 mg/dL — AB (ref 65–99)
GLUCOSE-CAPILLARY: 208 mg/dL — AB (ref 65–99)
Glucose-Capillary: 180 mg/dL — ABNORMAL HIGH (ref 65–99)
Glucose-Capillary: 221 mg/dL — ABNORMAL HIGH (ref 65–99)
Glucose-Capillary: 141 mg/dL — ABNORMAL HIGH (ref 65–99)

## 2015-07-07 LAB — TRIGLYCERIDES: TRIGLYCERIDES: 713 mg/dL — AB (ref <150)

## 2015-07-07 LAB — LIPASE, BLOOD: Lipase: 29 U/L (ref 11–51)

## 2015-07-07 MED ORDER — PRAVASTATIN SODIUM 40 MG PO TABS
40.0000 mg | ORAL_TABLET | Freq: Every day | ORAL | Status: DC
  Administered 2015-07-07 – 2015-07-08 (×2): 40 mg via ORAL
  Filled 2015-07-07 (×2): qty 1

## 2015-07-07 MED ORDER — INSULIN GLARGINE 100 UNIT/ML ~~LOC~~ SOLN
10.0000 [IU] | Freq: Every day | SUBCUTANEOUS | Status: DC
  Administered 2015-07-07: 10 [IU] via SUBCUTANEOUS
  Filled 2015-07-07: qty 0.1

## 2015-07-07 MED ORDER — INSULIN GLARGINE 100 UNIT/ML ~~LOC~~ SOLN
30.0000 [IU] | Freq: Once | SUBCUTANEOUS | Status: AC
  Administered 2015-07-07: 30 [IU] via SUBCUTANEOUS
  Filled 2015-07-07 (×2): qty 0.3

## 2015-07-07 MED ORDER — INSULIN ASPART 100 UNIT/ML ~~LOC~~ SOLN
0.0000 [IU] | Freq: Three times a day (TID) | SUBCUTANEOUS | Status: DC
  Administered 2015-07-07: 3 [IU] via SUBCUTANEOUS
  Administered 2015-07-07 – 2015-07-08 (×3): 5 [IU] via SUBCUTANEOUS
  Administered 2015-07-08 – 2015-07-09 (×2): 3 [IU] via SUBCUTANEOUS
  Administered 2015-07-09: 11 [IU] via SUBCUTANEOUS
  Filled 2015-07-07: qty 5
  Filled 2015-07-07 (×2): qty 3
  Filled 2015-07-07: qty 11
  Filled 2015-07-07 (×2): qty 5
  Filled 2015-07-07: qty 3

## 2015-07-07 MED ORDER — INSULIN GLARGINE 100 UNIT/ML ~~LOC~~ SOLN
40.0000 [IU] | Freq: Every day | SUBCUTANEOUS | Status: DC
  Administered 2015-07-08 – 2015-07-09 (×2): 40 [IU] via SUBCUTANEOUS
  Filled 2015-07-07 (×3): qty 0.4

## 2015-07-07 MED ORDER — INSULIN ASPART 100 UNIT/ML ~~LOC~~ SOLN
0.0000 [IU] | Freq: Every day | SUBCUTANEOUS | Status: DC
  Administered 2015-07-08: 21:00:00 2 [IU] via SUBCUTANEOUS
  Filled 2015-07-07: qty 2

## 2015-07-07 NOTE — Plan of Care (Signed)
Problem: Nutritional: Goal: Ability to achieve adequate nutritional intake will improve Started on clear liquid diet this am  Problem: Education: Goal: Knowledge of [place] [facility] Education information/materials will improve Outcome: Progressing Wanting minima education and teaching this am due to pain.  Problem: Safety: Goal: Ability to remain free from injury will improve Outcome: Progressing No injury  Problem: Pain Managment: Goal: General experience of comfort will improve Outcome: Progressing Pain relief with alternating dilaudid and vicodin  Problem: Physical Regulation: Goal: Ability to maintain clinical measurements within normal limits will improve Outcome: Progressing Triglycerides improving.  Switched from IV to long acting and ac/hs novolog insulin  Problem: Tissue Perfusion: Goal: Risk factors for ineffective tissue perfusion will decrease Outcome: Progressing Lovenox  Problem: Fluid Volume: Goal: Ability to maintain a balanced intake and output will improve Outcome: Progressing Taking lear liquids.  Good uop  Problem: Nutrition: Goal: Adequate nutrition will be maintained Outcome: Progressing Clear liquids  Problem: Bowel/Gastric: Goal: Will not experience complications related to bowel motility Outcome: Progressing Pt states bm 2 days ago. No stool documented. Abdomen distended with hypoactive bowel sounds and tenderness.  Narcotics for pain control

## 2015-07-07 NOTE — Plan of Care (Signed)
Problem: Education: Goal: Knowledge of  General Education information/materials will improve Outcome: Progressing Educated re self injection of insulin. Pt  Performed x2 injection and performed well.  Cont to have pt give self injections  Problem: Safety: Goal: Ability to remain free from injury will improve Outcome: Progressing Steady on feet.  Up to bathroom. Pt instructed to call for assist if  He felt dizzy or unsteady  Problem: Health Behavior/Discharge Planning: Goal: Ability to manage health-related needs will improve Outcome: Progressing Calm / cooperative  Problem: Pain Managment: Goal: General experience of comfort will improve Outcome: Not Progressing Prn given x2 since arrival to room 129.   Rates pain 7-7/10.  tol clear liqs  Some. Wanting solid foods  Problem: Skin Integrity: Goal: Risk for impaired skin integrity will decrease Outcome: Progressing eccymosis at areas  Problem: Activity: Goal: Risk for activity intolerance will decrease Outcome: Progressing oob to bathroom  Problem: Nutrition: Goal: Adequate nutrition will be maintained Outcome: Progressing See above note

## 2015-07-07 NOTE — Progress Notes (Signed)
[facility] - [place] at [facility]   PATIENT NAME: Donald Hart    MR#:  [id number]  DATE OF BIRTH:  12-Jun-1971  SUBJECTIVE:  CHIEF COMPLAINT:   Chief Complaint  Patient presents with  . Abdominal Pain   Happy to be out of ICU and off of insulin drip. Continues to have abdominal pain. Has not had a bowel movement.  REVIEW OF SYSTEMS:  Review of Systems  Constitutional: Positive for malaise/fatigue. Negative for fever and chills.  HENT: Negative for ear discharge, ear pain and nosebleeds.   Eyes: Negative for blurred vision.  Respiratory: Negative for cough, shortness of breath and wheezing.   Cardiovascular: Negative for chest pain and palpitations.  Gastrointestinal: Positive for heartburn, abdominal pain and constipation. Negative for nausea, vomiting and diarrhea.  Genitourinary: Negative for dysuria.  Musculoskeletal: Negative for myalgias.  Skin: Negative for rash.  Neurological: Negative for dizziness, tremors, speech change, focal weakness, seizures and headaches.  Psychiatric/Behavioral: Negative for depression.    DRUG ALLERGIES:  No Known Allergies  VITALS:  Blood pressure 143/97, pulse 73, temperature 98.6 F (37 C), temperature source Oral, resp. rate 10, height 6\' 1"  (1.854 m), weight 108.863 kg (240 lb), SpO2 99 %.  PHYSICAL EXAMINATION:  Physical Exam  GENERAL:  45 y.o.-year-old obese patient lying in the bed no acute distress  EYES: Pupils equal, round, [name], normocephalic. Oropharynx and nasopharynx clear.  NECK:  Supple, [name], [name], no wheezing, rales,[name], [name], rubs, or gallops.  ABDOMEN: firm, distended, generalized  tenderness, worse in bilateral upper quadrants, no guarding or rigidity noted. Bowel sounds are hypoactive. No organomegaly or mass.  EXTREMITIES: No pedal edema, cyanosis, or clubbing.  NEUROLOGIC: Cranial nerves II through XII are intact. Muscle strength 5/5 in all extremities. Sensation intact. Gait not checked.  PSYCHIATRIC: The patient is alert and oriented x 3.  SKIN: No obvious rash, lesion, or ulcer.    LABORATORY PANEL:   CBC  Recent Labs Lab 07/07/15 0408  WBC 10.5  HGB 14.8  HCT 43.2  PLT 193   ------------------------------------------------------------------------------------------------------------------  Chemistries   Recent Labs Lab 07/07/15 0408  NA 136  K 3.6  CL 105  CO2 25  GLUCOSE 169*  BUN 5*  CREATININE 0.51*  CALCIUM 8.9  AST 27  ALT 25  ALKPHOS 76  BILITOT 0.9   ------------------------------------------------------------------------------------------------------------------  Cardiac Enzymes No results for input(s): TROPONINI in the last 168 hours. ------------------------------------------------------------------------------------------------------------------  RADIOLOGY:  No results found.  EKG:   Orders placed or performed during the hospital encounter of 07/03/15  . ED EKG  . ED EKG  . EKG 12-Lead  . EKG 12-Lead  . EKG 12-Lead  . EKG 12-Lead    ASSESSMENT AND PLAN:   44y/o obese male with PMH significant for Hypertension, hyperlipidemia and alcohol abuse admitted for acute pancreatitis  #1 Acute pancreatitis- alcohol induced and also hypertriglyceridemia - TG level>4000 on admission- apheresis not available here - on insulin drip and also D5 drip for 48 hours, discontinued this morning as triglycerides are in 700s - continue statin and gemfibrozil added - Also an alcoholic, cessation advised - CT abd with no gall stones - Lipase normal - Tolerating clear fluids. Does have continued abdominal pain though this seems more  crampy and diffuse. Suspected is due to gas rather than pancreatitis. Will repeat KUB today  #2 Leukocytosis- stress reaction likely. Improved - no source of infec identified, no antibiotics needed  #3 Hypokalemia- resolved. likely from insulin drip- being replaced and monitored  #4 new-onset diabetes mellitus-A1c is 10.6. - Appreciate endocrinology consultation. Recommendations for insulin dosing. I have discontinued insulin infusion this morning and he is now on glargine with sliding scale. Dose changes noted. Need for prescriptions on discharge noted. Diabetes education today  #5 Alcohol abuse disorder- no withdrawal at this time On CIWA protocol  #6 HTN- controlled. Continue coreg and Norvasc.  #7 Hyperlipidemia- on statin and gemfibrozil added  #8 DVT Prophylaxis- SQ Heparin   All the records are reviewed and case discussed with Care Management/Social Workerr. Management plans discussed with the patient, family and they are in agreement. Care plan discussed with the patient and his wife at the bedside  CODE STATUS: Full Code  TOTAL CRITICAL CARE TIME SPENT IN TAKING CARE OF THIS PATIENT: 40 minutes.   POSSIBLE D/C IN 1-2 DAYS, DEPENDING ON CLINICAL CONDITION.   Elby ShowersWALSH, CATHERINE M.D on 07/07/2015 at 3:43 PM  Between 7am to 6pm - Pager - [phone] After 6pm go to [email or web address] - password [email or web address] [facility]  [facility]  Office  [phone]  CC: Primary care physician; Dione Housekeeperlmedo, Mario Ernesto, MD

## 2015-07-07 NOTE — Progress Notes (Signed)
Endocrinology follow up  REQUESTING PROVIDER: Fidela Juneau, MD CONSULTANT: Wendall Mola, MD  History of present illness: Donald Hart is a 45 y.o. male admitted with acute pancreatitis thought to be induced by alcohol abuse and hypertriglyceridemia. Triglyceride level was 4000+ upon admission. This was his first ever episode of acute pancreatitis.He has had Pre-diabetes but denies history of type 2 diabetes. Hb A1c is 10.6%, consistent with new diagnosis of type 2 diabetes. He is a 1 pack per day cigarette smoker.   Patient was transferred from CCU to floor today. Was first transitioned of IV insulin. Was requiring 2.6 - 3.2 units/hr insulin. Was given Lantus 10 units at 8 AM and NovoLog correction SSI has been ordered. He is ordered a clear diet. States he ate only small amount of jello for breakfast. States he does have an appetite. Denies N/V. Complains of persistent severe abdominal pain. Requests pain medications. Has been seen by Diabetes coordinators. Has not yet had teaching about self-administration of insulin.   Medical history Past Medical History  Diagnosis Date  . Hypertension   . Gout   . Hypertriglyceridemia   . Alcohol abuse   . Type 2 diabetes mellitus (HCC) 07/2015  . Acute pancreatitis 07/2015    hospitalization at [facility]    Surgical history Past Surgical History  Procedure Laterality Date  . No past surgeries       Medications . allopurinol  200 mg Oral Daily  . amLODipine  10 mg Oral Daily  . carvedilol  6.25 mg Oral BID  . docusate sodium  100 mg Oral BID  . enoxaparin (LOVENOX) injection  40 mg Subcutaneous Q24H  . folic acid  1 mg Oral Daily  . gemfibrozil  600 mg Oral BID AC  . insulin aspart  0-15 Units Subcutaneous TID WC  . insulin aspart  0-5 Units Subcutaneous QHS              . multivitamin with minerals  1 tablet Oral Daily  . nicotine  21 mg Transdermal Daily  . pantoprazole  40 mg Oral Daily  . pravastatin  40 mg Oral q1800  .  pregabalin  75 mg Oral TID  . sodium chloride  3 mL Intravenous Q12H  . thiamine  100 mg Oral Daily     Social history Social History  Substance Use Topics  . Smoking status: Current Every Day Smoker -- 2.00 packs/day  . Smokeless tobacco: None  . Alcohol Use: 100.8 oz/week    168 Cans of beer per week  Alcohol use described as one 12-pack beer daily.   Family history Family History  Problem Relation Age of Onset  . Adopted: Yes     Review of systems CV: no chest pain or palpitations PULM: no cough or shortness of breath ABD: no abdominal pain or nausea or vomiting   Physical Exam BP 143/97 mmHg  Pulse 73  Temp(Src) 98.6 F (37 C) (Oral)  Resp 10  Ht 6\' 1"  (1.854 m)  Wt 108.863 kg (240 lb)  BMI 31.67 kg/m2  SpO2 99%  GEN: well developed, well nourished male, in NAD. Appears uncomfortable. Lying flat in bed with hand on abdomen. HEENT: No proptosis, EOMI, lid lag or stare. Oropharynx is clear.  NECK: supple, trachea midline.   RESPIRATORY: clear bilaterally, no wheeze, good inspiratory effort. CV: No carotid bruits, RRR. ABD: soft, +epigastric TTP, no organomegaly EXT: no peripheral edema SKIN: no dermatopathy or rash   LYMPH: no submandibular or supraclavicular LAD NEURO:  PERRL, no dysarthria PSYC: alert and oriented, good insight  Labs Results for orders placed or performed during the hospital encounter of 07/03/15 (from the past 24 hour(s))  Glucose, capillary     Status: Abnormal   Collection Time: 07/06/15  2:18 PM  Result Value Ref Range   Glucose-Capillary 138 (H) 65 - 99 mg/dL  Glucose, capillary     Status: Abnormal   Collection Time: 07/06/15  4:19 PM  Result Value Ref Range   Glucose-Capillary 151 (H) 65 - 99 mg/dL  Glucose, capillary     Status: Abnormal   Collection Time: 07/06/15  6:06 PM  Result Value Ref Range   Glucose-Capillary 148 (H) 65 - 99 mg/dL  Glucose, capillary     Status: Abnormal   Collection Time: 07/06/15  7:47 PM  Result  Value Ref Range   Glucose-Capillary 157 (H) 65 - 99 mg/dL  Glucose, capillary     Status: Abnormal   Collection Time: 07/06/15  9:47 PM  Result Value Ref Range   Glucose-Capillary 172 (H) 65 - 99 mg/dL  Glucose, capillary     Status: Abnormal   Collection Time: 07/06/15 11:56 PM  Result Value Ref Range   Glucose-Capillary 162 (H) 65 - 99 mg/dL  Glucose, capillary     Status: Abnormal   Collection Time: 07/07/15  1:43 AM  Result Value Ref Range   Glucose-Capillary 167 (H) 65 - 99 mg/dL  Glucose, capillary     Status: Abnormal   Collection Time: 07/07/15  3:40 AM  Result Value Ref Range   Glucose-Capillary 189 (H) 65 - 99 mg/dL  Triglycerides     Status: Abnormal   Collection Time: 07/07/15  4:05 AM  Result Value Ref Range   Triglycerides 713 (H) <150 mg/dL  CBC     Status: None   Collection Time: 07/07/15  4:08 AM  Result Value Ref Range   WBC 10.5 3.8 - 10.6 K/uL   RBC 4.54 4.40 - 5.90 MIL/uL   Hemoglobin 14.8 13.0 - 18.0 g/dL   HCT 32.943.2 51.840.0 - 84.152.0 %   MCV 95.3 80.0 - 100.0 fL   MCH 32.6 26.0 - 34.0 pg   MCHC 34.2 32.0 - 36.0 g/dL   RDW 66.012.1 63.011.5 - 16.014.5 %   Platelets 193 150 - 440 K/uL  Comprehensive metabolic panel     Status: Abnormal   Collection Time: 07/07/15  4:08 AM  Result Value Ref Range   Sodium 136 135 - 145 mmol/L   Potassium 3.6 3.5 - 5.1 mmol/L   Chloride 105 101 - 111 mmol/L   CO2 25 22 - 32 mmol/L   Glucose, Bld 169 (H) 65 - 99 mg/dL   BUN 5 (L) 6 - 20 mg/dL   Creatinine, Ser 1.090.51 (L) 0.61 - 1.24 mg/dL   Calcium 8.9 8.9 - 32.310.3 mg/dL   Total Protein 6.9 6.5 - 8.1 g/dL   Albumin 3.0 (L) 3.5 - 5.0 g/dL   AST 27 15 - 41 U/L   ALT 25 17 - 63 U/L   Alkaline Phosphatase 76 38 - 126 U/L   Total Bilirubin 0.9 0.3 - 1.2 mg/dL   GFR calc non Af Amer >60 >60 mL/min   GFR calc Af Amer >60 >60 mL/min   Anion gap 6 5 - 15  Lipase, blood     Status: None   Collection Time: 07/07/15  4:08 AM  Result Value Ref Range   Lipase 29 11 - 51 U/L  Glucose,  capillary      Status: Abnormal   Collection Time: 07/07/15  5:56 AM  Result Value Ref Range   Glucose-Capillary 162 (H) 65 - 99 mg/dL  Glucose, capillary     Status: Abnormal   Collection Time: 07/07/15  7:44 AM  Result Value Ref Range   Glucose-Capillary 180 (H) 65 - 99 mg/dL  Glucose, capillary     Status: Abnormal   Collection Time: 07/07/15 12:33 PM  Result Value Ref Range   Glucose-Capillary 221 (H) 65 - 99 mg/dL    Assessment 1. Acute pancreatitis due to alcohol and hypertriglyceridemia 2. Alcohol abuse, probable alcoholism 3. Type 2 diabetes, newly diagnosed  Plan 1. Basal insulin requirement was approx 65 units of insulin daily while on the IV insulin. It does not appear he was getting that concurrently with IV dextrose. Therefore, his basal insulin requirement may be about 65 units per day. As he has only received 10 units glargine today, I will add additional 30 units now and then give 40 units again in 24 hrs, to be titrated up to fasting sugars <130. 2. Add a scheduled dose of NovoLog insulin once he is eating a diet other than clears. Consider starting NovoLog 10 units tid AC and again, titrating up based on next pre-prandial sugar to aim to keep pre-prandials <180. 3. Will modify diet as the FULL LIQUID diet always includes full sugar items like juice and regular popsicles. Will modify diet to state no sweets or sugar or juice or popsicles. Once tolerating PO, consider modifying his diet to low carb with "no concentrated sweets" documented in the text of the order to dietary. 4. Will ask Diabetes Coordinator to follow up and institute home teaching about self administration of insulin. 5. Home medications (if unchanged before hospital discharge) must include: LANTUS SOLOSTAR PEN - dose TBD NOVOLOG FLEXPEN - dose TBD INSULIN PEN NEEDLES for use 4 times daily #100 BLOOD GLUCOSE METER BLOOD GLUCSE TEST STRIPS for use 4 times daily #100 LANCETS for use 4 times daily #100  Patient  should follow up with Dr Aliene Altes after hospital discharge. Endocrinology services will not be available this upcoming weekend. We will see patient in follow up on Monday if he remains hospitalized.

## 2015-07-07 NOTE — Care Management (Signed)
Patient diagnosed with new onset diabetes.  Patient thus far has not been able to speak with CM.  He is complaining of consider abdominal pain and is requiring IV pain meds.  DM coordinator has also made attempt to speak with patient.  He is scoring 0 on CIWA.

## 2015-07-07 NOTE — Progress Notes (Signed)
Inpatient Diabetes Program Recommendations  AACE/ADA: New Consensus Statement on Inpatient Glycemic Control (2015)  Target Ranges:  Prepandial:   less than 140 mg/dL      Peak postprandial:   less than 180 mg/dL (1-2 hours)      Critically ill patients:  140 - 180 mg/dL   Review of Glycemic Control  Results for Donald Hart, Donald Hart (MRN [id number]) as of 07/07/2015 13:36  Ref. Range 07/07/2015 01:43 07/07/2015 03:40 07/07/2015 05:56 07/07/2015 07:44 07/07/2015 12:33  Glucose-Capillary Latest Ref Range: 65-99 mg/dL 244167 (H) 010189 (H) 272162 (H) 180 (H) 221 (H)   Diabetes history: none Outpatient Diabetes medications: none Current orders for Inpatient glycemic control: Lantus 40 units qday beginning tomorrow, Novolog 0-15 units tid, Novolog 0-5 qhs  Patient came off glucostabilizer this am and was given 10 units of Lantus insulin 2 hours before it was discontinued,  30 more units of Lantus ordered to be given now. Lantus 40 units to begin tomorrow am.  Spoke with patient and his sister who was visiting; 2 sisters have diabetes and the sister who was visiting had gastric bypass in an attempt to dodge diabetes.   Spoke to patient about the treatment of diabetes and the use of insulin while inpatient- discussed long acting and short acting insulins. Using the teach back method, I have instructed him on the use of both the syringe and the insulin pen for insulin administration- he was very proficient- daughters asked very appropriate questions to clarify and will be a great resource- written and pictorial instructions reviewed.  I have instructed him that he will need to check his blood sugars when he is discharged from the hospital and that he will need to bring those blood sugar numbers with him when he follows up with his family doctor.  Discussed HgbA1C and risk factors in developing diabetes (age, stress, lack of exercise, family history, illness) I have reviewed the treatment of hypoglycemia and discussed the long term  consequences of not caring for his diabetes.    Staff; please review insulin administration each time insulin is administered. Discuss site rotation, storage of insulin and mechanism of action (and when it should be taken). Review treatment of low blood sugars.   Susette RacerJulie Domani Bakos, RN, BA, MHA, CDE Diabetes Coordinator Inpatient Diabetes Program  [phone] (Team Pager) [phone] [facility] Office) 07/07/2015 3:11 PM

## 2015-07-07 NOTE — Progress Notes (Signed)
Transferred to room 129.  Report called using SBAR

## 2015-07-07 NOTE — Progress Notes (Signed)
Nutrition Follow-up     INTERVENTION:  Meals and snacks:await diet progression. Recommend heart healthy/carb modified diet when able to tolerate solid foods Nutrition diet education: Attempted to educate on diet but reports in pain and does not want to be educated at this time.  Materials left at bedside and will follow-up as time allows   NUTRITION DIAGNOSIS:   Inadequate oral intake related to acute illness as evidenced by NPO status.    GOAL:   Patient will meet greater than or equal to 90% of their needs    MONITOR:    (Energy Intake, Anthropometrics, Electrolyte/Renal Profile, Digestive System, Glucose Profile)  REASON FOR ASSESSMENT:   Diagnosis    ASSESSMENT:      Current Nutrition: reports taking some jello and popscile this am   Gastrointestinal Profile:reports abdominal pain Last BM: unknown   Scheduled Medications:  . allopurinol  200 mg Oral Daily  . amLODipine  10 mg Oral Daily  . carvedilol  6.25 mg Oral BID  . docusate sodium  100 mg Oral BID  . enoxaparin (LOVENOX) injection  40 mg Subcutaneous Q24H  . folic acid  1 mg Oral Daily  . gemfibrozil  600 mg Oral BID AC  . insulin aspart  0-15 Units Subcutaneous TID WC  . insulin aspart  0-5 Units Subcutaneous QHS  . insulin glargine  10 Units Subcutaneous Daily  . multivitamin with minerals  1 tablet Oral Daily  . nicotine  21 mg Transdermal Daily  . pantoprazole  40 mg Oral Daily  . pravastatin  40 mg Oral q1800  . pregabalin  75 mg Oral TID  . sodium chloride  3 mL Intravenous Q12H  . thiamine  100 mg Oral Daily     Electrolyte/Renal Profile and Glucose Profile:   Recent Labs Lab 07/05/15 0527 07/06/15 0631 07/07/15 0408  NA 138 138 136  K 3.2* 3.5 3.6  CL 107 106 105  CO2 25 25 25   BUN 7 <5* 5*  CREATININE 0.56* 0.50* 0.51*  CALCIUM 8.4* 8.9 8.9  GLUCOSE 158* 150* 169*      Weight Trend since Admission: Filed Weights   07/03/15 1028 07/03/15 1715  Weight: 240 lb  (108.863 kg) 242 lb 15.2 oz (110.2 kg)      Diet Order:  Diet clear liquid Room service appropriate?: Yes; Fluid consistency:: Thin  Skin:   reviewed  Height:   Ht Readings from Last 1 Encounters:  07/03/15 6\' 1"  (1.854 m)    Weight:   Wt Readings from Last 1 Encounters:  07/03/15 242 lb 15.2 oz (110.2 kg)    Ideal Body Weight:     BMI:  Body mass index is 32.06 kg/(m^2).  Estimated Nutritional Needs:   Kcal:  2325-2790 kcals (BEE 1789, 1.3 AF, 1.0-1.2 IF) using IBW 84 kg  Protein:  84-101 g (1.0-1.2 g/kg)   Fluid:  2100-2520 mL (25-30 ml/kg)   EDUCATION NEEDS:   Education needs no appropriate at this time  MODERATE Care Level  Beuna Bolding B. Freida BusmanAllen, RD, LDN [phone] (pager) Weekend/On-Call pager [phone])

## 2015-07-08 LAB — GLUCOSE, CAPILLARY
GLUCOSE-CAPILLARY: 172 mg/dL — AB (ref 65–99)
GLUCOSE-CAPILLARY: 122 mg/dL — AB (ref 65–99)
GLUCOSE-CAPILLARY: 204 mg/dL — AB (ref 65–99)
Glucose-Capillary: 232 mg/dL — ABNORMAL HIGH (ref 65–99)
Glucose-Capillary: 214 mg/dL — ABNORMAL HIGH (ref 65–99)

## 2015-07-08 MED ORDER — POLYETHYLENE GLYCOL 3350 17 G PO PACK
17.0000 g | PACK | Freq: Every day | ORAL | Status: DC
  Administered 2015-07-08 – 2015-07-09 (×2): 17 g via ORAL
  Filled 2015-07-08 (×2): qty 1

## 2015-07-08 MED ORDER — SUCRALFATE 1 GM/10ML PO SUSP
1.0000 g | Freq: Three times a day (TID) | ORAL | Status: DC
  Administered 2015-07-08 – 2015-07-09 (×5): 1 g via ORAL
  Filled 2015-07-08 (×5): qty 10

## 2015-07-08 MED ORDER — POLYETHYLENE GLYCOL 3350 17 G PO PACK: 17.0000 g | PACK | Freq: Every day | ORAL | Status: DC

## 2015-07-08 NOTE — Progress Notes (Signed)
[facility] - [place] at [facility]   PATIENT NAME: Donald Hart    MR#:  [id number]  DATE OF BIRTH:  March 21, 1971  SUBJECTIVE:  CHIEF COMPLAINT:   Chief Complaint  Patient presents with  . Abdominal Pain   Still have abd pain. Asking to try some diet.Was taking pain meds daily - Alieve.Denies nausea and blood in stool.  REVIEW OF SYSTEMS:  Review of Systems  Constitutional: Positive for malaise/fatigue. Negative for fever and chills.  HENT: Negative for ear discharge, ear pain and nosebleeds.   Eyes: Negative for blurred vision.  Respiratory: Negative for cough, shortness of breath and wheezing.   Cardiovascular: Negative for chest pain and palpitations.  Gastrointestinal: Positive for heartburn, abdominal pain and constipation. Negative for nausea, vomiting and diarrhea.  Genitourinary: Negative for dysuria.  Musculoskeletal: Negative for myalgias.  Skin: Negative for rash.  Neurological: Negative for dizziness, tremors, speech change, focal weakness, seizures and headaches.  Psychiatric/Behavioral: Negative for depression.    DRUG ALLERGIES:  No Known Allergies  VITALS:  Blood pressure 131/82, pulse 75, temperature 98.5 F (36.9 C), temperature source Oral, resp. rate 20, height 6\' 1"  (1.854 m), weight 108.863 kg (240 lb), SpO2 98 %.  PHYSICAL EXAMINATION:  Physical Exam  GENERAL:  45 y.o.-year-old obese patient lying in the bed no acute distress  EYES: Pupils equal, round, [name], normocephalic. Oropharynx and nasopharynx clear.  NECK:  Supple, [name], [name], no wheezing, rales,[name], [name], rubs, or gallops.  ABDOMEN: firm, distended, generalized  tenderness, worse in bilateral upper quadrants, no guarding or rigidity noted. Bowel sounds are hypoactive. No organomegaly or mass.  EXTREMITIES: No pedal edema, cyanosis, or clubbing.  NEUROLOGIC: Cranial nerves II through XII are intact. Muscle strength 5/5 in all extremities. Sensation intact. Gait not checked.  PSYCHIATRIC: The patient is alert and oriented x 3.  SKIN: No obvious rash, lesion, or ulcer.    LABORATORY PANEL:   CBC  Recent Labs Lab 07/07/15 0408  WBC 10.5  HGB 14.8  HCT 43.2  PLT 193   ------------------------------------------------------------------------------------------------------------------  Chemistries   Recent Labs Lab 07/07/15 0408  NA 136  K 3.6  CL 105  CO2 25  GLUCOSE 169*  BUN 5*  CREATININE 0.51*  CALCIUM 8.9  AST 27  ALT 25  ALKPHOS 76  BILITOT 0.9   ------------------------------------------------------------------------------------------------------------------  Cardiac Enzymes No results for input(s): TROPONINI in the last 168 hours. ------------------------------------------------------------------------------------------------------------------  RADIOLOGY:  Dg Abd 1 View  07/07/2015  CLINICAL DATA:  Abdominal pain, umbilical to right side. EXAM: ABDOMEN - 1 VIEW COMPARISON:  Abdominal plain film dated 07/05/2015 and CT abdomen dated 07/03/2015. FINDINGS: Bowel gas pattern is nonobstructive. Oral contrast from previous CT has moved into the normal caliber colon. No evidence of soft tissue mass or abnormal fluid collections seen. No evidence of free intraperitoneal air seen. Mild degenerative change noted within the lumbar spine. IMPRESSION: 1. Nonobstructive bowel gas pattern and no evidence of acute intra-abdominal abnormality seen. 2. Pancreatitis versus duodenitis identified on earlier CT of 07/03/2015. No complicating features identified on today's plain film exam. Electronically Signed   By: Bary Richard M.D.   On:  07/07/2015 16:39    EKG:   Orders placed or performed during the hospital encounter of  07/03/15  . ED EKG  . ED EKG  . EKG 12-Lead  . EKG 12-Lead  . EKG 12-Lead  . EKG 12-Lead    ASSESSMENT AND PLAN:   45y/o obese male with PMH significant for Hypertension, hyperlipidemia and alcohol abuse admitted for acute pancreatitis  #1 Acute pancreatitis- alcohol induced and also hypertriglyceridemia - TG level>4000 on admission- apheresis not available here - on insulin drip and also D5 drip for 48 hours, discontinued this morning as triglycerides are in 700s - continue statin and gemfibrozil added - Also an alcoholic, cessation advised - CT abd with no gall stones - Lipase normal - Tolerating clear fluids. Does have continued abdominal pain though this seems more crampy and diffuse.  - Was taking chronic pain meds and had duodenal swelling on CT abd- so it may be gastritis or duodenitis or PUD. - on PPI, added sucralfate. - Spoke to GI- if pain does not improve, will do official consult.  #2 Leukocytosis- stress reaction likely. Improved - no source of infec identified, no antibiotics needed  #3 Hypokalemia- resolved. likely from insulin drip- being replaced and monitored  #4 new-onset diabetes mellitus-A1c is 10.6. - Appreciate endocrinology consultation. Recommendations for insulin dosing. I have discontinued insulin infusion this morning and he is now on glargine with sliding scale. Dose changes noted. Need for prescriptions on discharge noted. Diabetes education today  #5 Alcohol abuse disorder- no withdrawal at this time On CIWA protocol  #6 HTN- controlled. Continue coreg and Norvasc.  #7 Hyperlipidemia- on statin and gemfibrozil added  #8 DVT Prophylaxis- SQ Heparin   All the records are reviewed and case discussed with Care Management/Social Workerr. Management plans discussed with the patient, family and they are in agreement. Care plan discussed with the patient and  his wife at the bedside  CODE STATUS: Full Code  TOTAL CRITICAL CARE TIME SPENT IN TAKING CARE OF THIS PATIENT: 40 minutes.   POSSIBLE D/C IN 1-2 DAYS, DEPENDING ON CLINICAL CONDITION.   Altamese DillingVACHHANI, Jasmarie Coppock M.D on 07/08/2015 at 4:56 PM  Between 7am to 6pm - Pager - [phone] After 6pm go to [email or web address] - password [email or web address] [email or web address]  [facility]  Office  [phone]  CC: Primary care physician; Dione Housekeeperlmedo, Mario Ernesto, MD

## 2015-07-08 NOTE — Plan of Care (Signed)
Problem: Education: Goal: Knowledge of [facility] Education information/materials will improve Outcome: Progressing Oriented to unit and how to contact RN and CNA.  Unable to provide teaching on insulin injection last evening because he did not need coverage.  Problem: Safety: Goal: Ability to remain free from injury will improve Outcome: Progressing Pt is a moderate fall risk.  Reviewed need to call before getting up and pt is compliant with plan of care.  Problem: Health Behavior/Discharge Planning: Goal: Ability to manage health-related needs will improve Outcome: Progressing Pt was seen by Diabetic educator yesterday.  Continue to provide opportunities to practice insulin injections when sliding scale insulin is needed.  Problem: Pain Managment: Goal: General experience of comfort will improve Outcome: Progressing Pt given IV Dilaudid 1 mg once and Norco 5/325 mg 2 tabs once this shift with pain decreasing from 7/10 to 1/10.

## 2015-07-08 NOTE — Plan of Care (Signed)
Problem: Nutritional: Goal: Ability to achieve adequate nutritional intake will improve Outcome: Progressing Diet advanced. Now on soft diet. Ate 100% of dinner. Tolerated it well.  Problem: Pain Managment: Goal: General experience of comfort will improve Outcome: Progressing RUQ and LUQ pain managed with dilaudid and norco. Denies nausea. GI consult ordered.  Problem: Physical Regulation: Goal: Ability to maintain clinical measurements within normal limits will improve Outcome: Progressing VSS. CIWA score 0.  Problem: Bowel/Gastric: Goal: Will not experience complications related to bowel motility Outcome: Progressing No BM for a week. Miralax initiated.

## 2015-07-09 ENCOUNTER — Inpatient Hospital Stay: Payer: BLUE CROSS/BLUE SHIELD

## 2015-07-09 LAB — GLUCOSE, CAPILLARY
GLUCOSE-CAPILLARY: 345 mg/dL — AB (ref 65–99)
Glucose-Capillary: 156 mg/dL — ABNORMAL HIGH (ref 65–99)

## 2015-07-09 MED ORDER — PRAVASTATIN SODIUM 40 MG PO TABS
40.0000 mg | ORAL_TABLET | Freq: Every day | ORAL | Status: DC
Start: 2015-07-09 — End: 2015-07-21

## 2015-07-09 MED ORDER — NICOTINE 21 MG/24HR TD PT24: 21.0000 mg | MEDICATED_PATCH | Freq: Every day | TRANSDERMAL | Status: DC

## 2015-07-09 MED ORDER — SUCRALFATE 1 G PO TABS: 1.0000 g | ORAL_TABLET | Freq: Three times a day (TID) | ORAL | Status: DC

## 2015-07-09 MED ORDER — INSULIN GLARGINE 100 UNIT/ML ~~LOC~~ SOLN: 40.0000 [IU] | Freq: Every day | SUBCUTANEOUS | Status: DC

## 2015-07-09 MED ORDER — PANTOPRAZOLE SODIUM 40 MG PO TBEC
40.0000 mg | DELAYED_RELEASE_TABLET | Freq: Two times a day (BID) | ORAL | Status: DC
Start: 2015-07-09 — End: 2015-07-21

## 2015-07-09 MED ORDER — GEMFIBROZIL 600 MG PO TABS: 600.0000 mg | ORAL_TABLET | Freq: Two times a day (BID) | ORAL | Status: DC

## 2015-07-09 MED ORDER — HYDROCODONE-ACETAMINOPHEN 5-325 MG PO TABS: 1.0000 | ORAL_TABLET | ORAL | Status: DC | PRN

## 2015-07-09 NOTE — Care Management Note (Signed)
Case Management Note  Patient Details  Name: Donald Hart MRN: [id number] Date of Birth: June 14, 1971  Subjective/Objective:       Donald Hart is a new diabetic.[facility] Diabetic Nurse has seen him and provided diabetic teaching. Discussed with Donald Hart nurse Donald Hart that Donald Hart needs to have scripts for Insulin, a box of insulin needles, and a script for a glucometer, from Dr Esaw GrandchildVachanni today before he is discharged.              Action/Plan:   Expected Discharge Date:                  Expected Discharge Plan:     In-House Referral:     Discharge planning Services     Post Acute Care Choice:    Choice offered to:     DME Arranged:    DME Agency:     HH Arranged:    HH Agency:     Status of Service:     Medicare Important Message Given:    Date Medicare IM Given:    Medicare IM give by:    Date Additional Medicare IM Given:    Additional Medicare Important Message give by:     If discussed at Long Length of Stay Meetings, dates discussed:    Additional Comments:  Adir Schicker A, RN 07/09/2015, 8:58 AM

## 2015-07-09 NOTE — Discharge Instructions (Signed)
Soft diet for few days. No over the counter pain meds, No alcohol.  Check blood sugar 2-3 times daily- keep a record of that and take it to your PMD in 2-3 weeks.

## 2015-07-09 NOTE — Progress Notes (Signed)
[facility]         [place]   07/09/2015  Patient: Donald Hart   Date of Birth:  20-Mar-1971  Date of admission:  07/03/2015  Date of Discharge  07/09/2015    To Whom it May Concern:   Donald Hart may return to work on 07/12/15 without any restrictions.   If you have any questions or concerns, please don't hesitate to call.  Sincerely,   Altamese DillingVACHHANI, Hristopher Missildine M.D Pager [phone]- [phone] Office : [phone]   .

## 2015-07-09 NOTE — Discharge Summary (Signed)
[facility] - [place] at [facility]   PATIENT NAME: Donald Hart    MR#:  [id number]  DATE OF BIRTH:  11/15/1970  DATE OF ADMISSION:  07/03/2015 ADMITTING PHYSICIAN: Alford Highland, MD  DATE OF DISCHARGE: 07/09/2015  PRIMARY CARE PHYSICIAN: [name], MD    ADMISSION DIAGNOSIS:  Hyperlipidemia [E78.5] Tachycardia [R00.0] Acute pancreatitis, unspecified pancreatitis type [K85.9] Abdominal pain, unspecified abdominal location [R10.9]  DISCHARGE DIAGNOSIS:  Active Problems:   Acute pancreatitis   Type 2 diabetes mellitus (HCC)   Alcohol abuse  Abdominal pain- likely Gastritis. SECONDARY DIAGNOSIS:   Past Medical History  Diagnosis Date  . Hypertension   . Gout   . Hypertriglyceridemia   . Alcohol abuse   . Type 2 diabetes mellitus (HCC) 07/2015  . Acute pancreatitis 07/2015    hospitalization at [facility] COURSE:   #1 Acute pancreatitis- alcohol induced and also hypertriglyceridemia - TG level>4000 on admission- apheresis not available here - on insulin drip and also D5 drip for 48 hours, discontinued this morning as triglycerides are in 700s - continue statin and gemfibrozil added - Also an alcoholic, cessation advised - CT abd with no gall stones - Lipase normal - Tolerating clear fluids. Does have continued abdominal pain though this seems more crampy and diffuse.  - Was taking chronic pain meds , chronic alcoholism and had duodenal swelling on CT abd- so it may be gastritis or duodenitis or PUD. - on PPI, added sucralfate. - Spoke to GI- pt tolerated soft diet- d/c home and follow in office. - advised about - No OTC pain meds, and NO Alcohol.  #2 Leukocytosis- stress reaction likely. Improved - no source of infec identified, no antibiotics needed  #3 Hypokalemia- resolved. likely from insulin drip- being replaced and monitored  #4 new-onset diabetes mellitus-A1c is 10.6. - Appreciate endocrinology consultation.  Recommendations for insulin dosing.  Dose changes noted.  - now on lantus. Need for prescriptions on discharge noted. Diabetes education done by nurse. - advised to check blood sugar 2-3 times daily.  #5 Alcohol abuse disorder- no withdrawal at this time On CIWA protocol  #6 HTN- controlled. Continue coreg and Norvasc.  #7 Hyperlipidemia- on statin and gemfibrozil added  #8 DVT Prophylaxis- SQ Heparin   DISCHARGE CONDITIONS:   Stable.  CONSULTS OBTAINED:  Treatment Team:  Alford Highland, MD Abby Lanetta Inch, MD Midge Minium, MD  DRUG ALLERGIES:  No Known Allergies  DISCHARGE MEDICATIONS:   Current Discharge Medication List    START taking these medications   Details  gemfibrozil (LOPID) 600 MG tablet Take 1 tablet (600 mg total) by mouth 2 (two) times daily before a meal. Qty: 60 tablet, Refills: 1    HYDROcodone-acetaminophen (NORCO/VICODIN) 5-325 MG tablet Take 1-2 tablets by mouth every 4 (four) hours as needed for moderate pain or severe pain. Qty: 30 tablet, Refills: 0    insulin glargine (LANTUS) 100 UNIT/ML injection Inject 0.4 mLs (40 Units total) into the skin daily. Qty: 10 mL, Refills: 1    nicotine (NICODERM CQ - DOSED IN MG/24 HOURS) 21 mg/24hr patch Place 1 patch (21 mg total) onto the skin daily. Qty: 28 patch, Refills: 0    pantoprazole (PROTONIX) 40 MG tablet Take 1 tablet (40 mg total) by mouth 2 (two) times daily. Qty: 60 tablet, Refills: 1    pravastatin (PRAVACHOL) 40 MG tablet Take 1 tablet (40 mg total) by mouth daily at 6 PM. Qty: 30 tablet, Refills: 1  sucralfate (CARAFATE) 1 g tablet Take 1 tablet (1 g total) by mouth 4 (four) times daily -  with meals and at bedtime. Qty: 120 tablet, Refills: 0      CONTINUE these medications which have NOT CHANGED   Details  allopurinol (ZYLOPRIM) 100 MG tablet Take 2 tablets by mouth daily. Refills: 0    amLODipine (NORVASC) 10 MG tablet Take 1 tablet by mouth daily. Refills: 0     carvedilol (COREG) 6.25 MG tablet Take 1 tablet by mouth 2 (two) times daily. Refills: 11    LYRICA 75 MG capsule Take 75 mg by mouth 3 (three) times daily. Refills: 5      STOP taking these medications     omeprazole (PRILOSEC) 20 MG capsule      simvastatin (ZOCOR) 40 MG tablet          DISCHARGE INSTRUCTIONS:     Follow with PMD in 2 weeks. Endocrine clijnic in 1 month. GI clinic in 1 month.  If you experience worsening of your admission symptoms, develop shortness of breath, life threatening emergency, suicidal or homicidal thoughts you must seek medical attention immediately by calling 911 or calling your MD immediately  if symptoms less severe.  You Must read complete instructions/literature along with all the possible adverse reactions/side effects for all the Medicines you take and that have been prescribed to you. Take any new Medicines after you have completely understood and accept all the possible adverse reactions/side effects.   Please note  You were cared for by a hospitalist during your hospital stay. If you have any questions about your discharge medications or the care you received while you were in the hospital after you are discharged, you can call the unit and asked to speak with the hospitalist on call if the hospitalist that took care of you is not available. Once you are discharged, your primary care physician will handle any further medical issues. Please note that NO REFILLS for any discharge medications will be authorized once you are discharged, as it is imperative that you return to your primary care physician (or establish a relationship with a primary care physician if you do not have one) for your aftercare needs so that they can reassess your need for medications and monitor your lab values.    Today   CHIEF COMPLAINT:   Chief Complaint  Patient presents with  . Abdominal Pain    HISTORY OF PRESENT ILLNESS:  Donald Hart  is a 45 y.o.  male with a known history of hypertension, diet-controlled diabetes, alcohol abuse. He presents with 4 days of severe abdominal pain. Pain is sharp 8-9 out of 10 intensity. He has not eaten any food in 3 days. He's been drinking a lot of liquids. No alcohol drinking in 4 days. Pain is mostly on the right side of his abdomen. Nothing makes it better or worse. He did have some nausea vomiting last week. Some diarrhea after taking a stool softener. He feels hot but no fever. Positive for fatigue. In the ER, CT scan consistent with pancreatitis. Laboratory data difficult to get result secondary to light. Neck specimen. I asked the nurse to get a fingerstick and it was 380.  VITAL SIGNS:  Blood pressure 104/76, pulse 55, [name], temperature source Oral, resp. rate 17, height 6\' 1"  (1.854 m), weight 108.863 kg (240 lb), SpO2 97 %.  I/O:   Intake/Output Summary (Last 24 hours) at 07/09/15 0851 Last data filed at  07/08/15 1800  Gross per 24 hour  Intake    480 ml  Output      0 ml  Net    480 ml    PHYSICAL EXAMINATION:   GENERAL: 45 y.o.-year-old obese patient lying in the bed no acute distress  EYES: Pupils equal, round, [name], normocephalic. Oropharynx and nasopharynx clear.  NECK: Supple, [name], [name], no wheezing, rales,[name], [name], rubs, or gallops.  ABDOMEN: firm, distended, generalized tenderness, worse in bilateral upper quadrants, no guarding or rigidity noted. Bowel sounds are hypoactive. No organomegaly or mass.  EXTREMITIES: No pedal edema, cyanosis, or clubbing.  NEUROLOGIC: Cranial nerves II through XII are intact. Muscle strength 5/5 in all extremities.  Sensation intact. Gait not checked.  PSYCHIATRIC: The patient is alert and oriented x 3.  SKIN: No obvious rash, lesion, or ulcer.   DATA REVIEW:   CBC  Recent Labs Lab 07/07/15 0408  WBC 10.5  HGB 14.8  HCT 43.2  PLT 193    Chemistries   Recent Labs Lab 07/07/15 0408  NA 136  K 3.6  CL 105  CO2 25  GLUCOSE 169*  BUN 5*  CREATININE 0.51*  CALCIUM 8.9  AST 27  ALT 25  ALKPHOS 76  BILITOT 0.9    Cardiac Enzymes No results for input(s): TROPONINI in the last 168 hours.  Microbiology Results  Results for orders placed or performed during the hospital encounter of 07/03/15  MRSA PCR Screening     Status: None   Collection Time: 07/03/15  5:40 PM  Result Value Ref Range Status   MRSA by PCR NEGATIVE NEGATIVE Final    Comment:        The GeneXpert MRSA Assay (FDA approved for NASAL specimens only), is one component of a comprehensive MRSA colonization surveillance program. It is not intended to diagnose MRSA infection nor to guide or monitor treatment for MRSA infections.     RADIOLOGY:  Dg Abd 1 View  07/07/2015  CLINICAL DATA:  Abdominal pain, umbilical to right side. EXAM: ABDOMEN - 1 VIEW COMPARISON:  Abdominal plain film dated 07/05/2015 and CT abdomen dated 07/03/2015. FINDINGS: Bowel gas pattern is nonobstructive. Oral contrast from previous CT has moved into the normal caliber colon. No evidence of soft tissue mass or abnormal fluid collections seen. No evidence of free intraperitoneal air seen. Mild degenerative change noted within the lumbar spine. IMPRESSION: 1. Nonobstructive bowel gas pattern and no evidence of acute intra-abdominal abnormality seen. 2. Pancreatitis versus duodenitis identified on earlier CT of 07/03/2015. No complicating features identified on today's plain film exam. Electronically Signed   By: Bary RichardStan  Maynard M.D.   On: 07/07/2015 16:39     Management plans discussed with the patient, family and they are in agreement.  CODE  STATUS:     Code Status Orders        Start     Ordered   07/03/15 1431  Full code   Continuous     07/03/15 1430      TOTAL TIME TAKING CARE OF THIS PATIENT: 40 minutes.    Altamese DillingVACHHANI, Borden Thune M.D on 07/09/2015 at 8:51 AM  Between 7am to 6pm - Pager - [phone]  After 6pm go to [email or web address] - password EPAS [facility]  [place]  [place] [facility]  Office  [phone]  CC: Primary care physician; [name], MD   Note: This dictation was prepared with Dragon dictation along with smaller phrase technology. Any transcriptional errors that result from this process are unintentional.

## 2015-07-09 NOTE — Progress Notes (Signed)
Called MD foot xray results in, he is to call patient ok to discharge to home

## 2015-07-09 NOTE — Progress Notes (Signed)
Pt demonstrates understanding of self administering his insulin this morning

## 2015-07-10 ENCOUNTER — Encounter: Payer: Self-pay | Admitting: Internal Medicine

## 2015-07-21 ENCOUNTER — Encounter: Payer: BLUE CROSS/BLUE SHIELD | Attending: Internal Medicine | Admitting: *Deleted

## 2015-07-21 ENCOUNTER — Encounter: Payer: Self-pay | Admitting: *Deleted

## 2015-07-21 VITALS — BP 124/82 | Ht 75.0 in | Wt 241.9 lb

## 2015-07-21 DIAGNOSIS — Z794 Long term (current) use of insulin: Secondary | ICD-10-CM

## 2015-07-21 DIAGNOSIS — E119 Type 2 diabetes mellitus without complications: Secondary | ICD-10-CM | POA: Diagnosis not present

## 2015-07-21 NOTE — Patient Instructions (Addendum)
Check blood sugars 4 x day before each meal and before bed every day  Exercise: Begin walking/chair exercises for   10-15  minutes   3 days a week  Avoid sugar sweetened drinks (juices)  Eat 3 meals day,  1-2  snacks a day  Space meals 4-6 hours apart  Carry fast acting glucose and a snack at all times  Rotate injection sites  Call if you want to schedule classes or additional appointment after checking with insurance.

## 2015-07-21 NOTE — Progress Notes (Signed)
Diabetes Self-Management Education  Visit Type: First/Initial  Appt. Start Time: 1305 Appt. End Time: 1435  07/21/2015  [name], identified by name and date of birth, is a 45 y.o. male with a diagnosis of Diabetes: Type 2.   ASSESSMENT  Blood pressure 124/82, height  (1.905 m), weight 241 lb 14.4 oz (109.725 kg). Body mass index is 30.24 kg/(m^2).      Diabetes Self-Management Education - 07/21/15 1459    Visit Information   Visit Type First/Initial   Initial Visit   Diabetes Type Type 2   Are you currently following a meal plan? Yes   What type of meal plan do you follow? decrease portions, no sweets   Are you taking your medications as prescribed? No  Pt has several medications that he didn't pick up from the pharmacy yet because of cost. He picked up medications for gout, HTN and diabetes.    Psychosocial Assessment   Patient Belief/Attitude about Diabetes Defeat/Burnout  "Depressed, sad, helpless"   Self-care barriers Other (comment)  Money for medications   Self-management support Doctor's office;Family   Other persons present Family Member  daughter   Patient Concerns Nutrition/Meal planning;Medication;Glycemic Control;Weight Control;Healthy Lifestyle;Problem Solving   Special Needs None   Preferred Learning Style Visual   Learning Readiness Change in progress   How often do you need to have someone help you when you read instructions, pamphlets, or other written materials from your doctor or pharmacy? 1 - Never   What is the last grade level you completed in school? 12th   Complications   Last HgB A1C per patient/outside source 10.6 %  07/03/15   How often do you check your blood sugar? 3-4 times/day   Fasting Blood glucose range (mg/dL) 161-096;>045 He reports FBG's usually 200's mg/dL. He didn't bring records or meter.    Postprandial Blood glucose range (mg/dL) 409-811;914-782;>956 He reports evening readings are 150's mg/dL but he had 213 mg/dL  last night after eating out for his birthday.    Have you had a dilated eye exam in the past 12 months? Yes   Have you had a dental exam in the past 12 months? No   Are you checking your feet? No   Dietary Intake   Breakfast egg sandwich wtih banana, juice; waffles with canadian bacon   Lunch fish sticks, [place] sandwich, chicken salad sandwich, baked chips   Snack (afternoon) peanut butter crackers or peanuts   Dinner wheat spaghetti; chicken quesadilla   Beverage(s) orange juce, water, diet soda   Exercise   Exercise Type ADL's   Patient Education   Previous Diabetes Education No   Disease state  Definition of diabetes, type 1 and 2, and the diagnosis of diabetes   Nutrition management  Role of diet in the treatment of diabetes and the relationship between the three main macronutrients and blood glucose level;Food label reading, portion sizes and measuring food.   Physical activity and exercise  Role of exercise on diabetes management, blood pressure control and cardiac health.   Medications Taught/reviewed insulin injection, site rotation, insulin storage and needle disposal.;Reviewed patients medication for diabetes, action, purpose, timing of dose and side effects.   Monitoring Purpose and frequency of SMBG.;Identified appropriate SMBG and/or A1C goals.   Acute complications Taught treatment of hypoglycemia - the 15 rule.   Chronic complications Relationship between chronic complications and blood glucose control   Psychosocial adjustment Identified and addressed patients feelings and concerns about diabetes   Individualized Goals (  developed by patient)   Reducing Risk Improve blood sugars Prevent diabetes complications Lose weight Lead a healthier lifestyle Become more fit   Outcomes   Expected Outcomes Demonstrated interest in learning. Expect positive outcomes      Individualized Plan for Diabetes Self-Management Training:   Learning Objective:  Patient will have a greater  understanding of diabetes self-management. Patient education plan is to attend individual and/or group sessions per assessed needs and concerns.   Plan:   Patient Instructions  Check blood sugars 4 x day before each meal and before bed every day Exercise: Begin walking/chair exercises for   10-15  minutes   3 days a week Avoid sugar sweetened drinks (juices) Eat 3 meals day,  1-2  snacks a day  Space meals 4-6 hours apart Carry fast acting glucose and a snack at all times Rotate injection sites Call if you want to schedule classes or additional appointment after checking with insurance.   Expected Outcomes:  Demonstrated interest in learning. Expect positive outcomes  Education material provided:  General Meal Planning Guidelines Simple Meal Plan Symptoms, causes and treatments of Hypoglycemia Home Exercise Guide for People with Diabetes  If problems or questions, patient to contact team via:  Sharion Settler, RN, CCM, CDE [phone]  Future DSME appointment:   Pt wants to check with his insurance plan before scheduling classes. He was also offered a follow up visit with the dietitian.

## 2015-08-28 ENCOUNTER — Encounter: Payer: Self-pay | Admitting: *Deleted

## 2015-11-18 ENCOUNTER — Emergency Department: Payer: BLUE CROSS/BLUE SHIELD

## 2015-11-18 ENCOUNTER — Encounter: Payer: Self-pay | Admitting: Emergency Medicine

## 2015-11-18 ENCOUNTER — Emergency Department
Admission: EM | Admit: 2015-11-18 | Discharge: 2015-11-18 | Disposition: A | Discharge status: Home / Self Care | Payer: BLUE CROSS/BLUE SHIELD | Attending: Emergency Medicine | Admitting: Emergency Medicine

## 2015-11-18 DIAGNOSIS — Z794 Long term (current) use of insulin: Secondary | ICD-10-CM | POA: Diagnosis not present

## 2015-11-18 DIAGNOSIS — I10 Essential (primary) hypertension: Secondary | ICD-10-CM | POA: Diagnosis not present

## 2015-11-18 DIAGNOSIS — Z79899 Other long term (current) drug therapy: Secondary | ICD-10-CM | POA: Insufficient documentation

## 2015-11-18 DIAGNOSIS — J189 Pneumonia, unspecified organism: Secondary | ICD-10-CM

## 2015-11-18 DIAGNOSIS — F17210 Nicotine dependence, cigarettes, uncomplicated: Secondary | ICD-10-CM | POA: Insufficient documentation

## 2015-11-18 DIAGNOSIS — N179 Acute kidney failure, unspecified: Secondary | ICD-10-CM | POA: Insufficient documentation

## 2015-11-18 DIAGNOSIS — R05 Cough: Secondary | ICD-10-CM | POA: Diagnosis present

## 2015-11-18 DIAGNOSIS — E119 Type 2 diabetes mellitus without complications: Secondary | ICD-10-CM | POA: Diagnosis not present

## 2015-11-18 LAB — COMPREHENSIVE METABOLIC PANEL
ALK PHOS: 63 U/L (ref 38–126)
ALT: 27 U/L (ref 17–63)
ANION GAP: 9 (ref 5–15)
AST: 27 U/L (ref 15–41)
Albumin: 3.8 g/dL (ref 3.5–5.0)
BUN: 8 mg/dL (ref 6–20)
CALCIUM: 8.8 mg/dL — AB (ref 8.9–10.3)
CO2: 25 mmol/L (ref 22–32)
Chloride: 103 mmol/L (ref 101–111)
Creatinine, Ser: 0.8 mg/dL (ref 0.61–1.24)
GFR calc non Af Amer: >60 mL/min (ref >60)
Glucose, Bld: 214 mg/dL — ABNORMAL HIGH (ref 65–99)
Potassium: 3.4 mmol/L — ABNORMAL LOW (ref 3.5–5.1)
SODIUM: 137 mmol/L (ref 135–145)
TOTAL PROTEIN: 7.7 g/dL (ref 6.5–8.1)
Total Bilirubin: 1.1 mg/dL (ref 0.3–1.2)

## 2015-11-18 LAB — CBC WITH DIFFERENTIAL/PLATELET
Basophils Absolute: 0.2 10*3/uL — ABNORMAL HIGH (ref 0–0.1)
Basophils Relative: 1 %
EOS ABS: 0.6 10*3/uL (ref 0–0.7)
HCT: 41.1 % (ref 40.0–52.0)
HEMOGLOBIN: 14.4 g/dL (ref 13.0–18.0)
LYMPHS ABS: 2.6 10*3/uL (ref 1.0–3.6)
Lymphocytes Relative: 16 %
MCH: 31.6 pg (ref 26.0–34.0)
MCHC: 35 g/dL (ref 32.0–36.0)
MCV: 90.4 fL (ref 80.0–100.0)
Monocytes Absolute: 1.7 10*3/uL — ABNORMAL HIGH (ref 0.2–1.0)
Neutro Abs: 11 10*3/uL — ABNORMAL HIGH (ref 1.4–6.5)
Neutrophils Relative %: 69 %
Platelets: 240 10*3/uL (ref 150–440)
RBC: 4.54 MIL/uL (ref 4.40–5.90)
RDW: 12.4 % (ref 11.5–14.5)
WBC: 16.1 10*3/uL — ABNORMAL HIGH (ref 3.8–10.6)

## 2015-11-18 LAB — URINE DRUG SCREEN, QUALITATIVE (ARMC ONLY)
AMPHETAMINES, UR SCREEN: NOT DETECTED
BENZODIAZEPINE, UR SCRN: NOT DETECTED
Barbiturates, Ur Screen: NOT DETECTED
Cannabinoid 50 Ng, Ur ~~LOC~~: NOT DETECTED
Cocaine Metabolite,Ur ~~LOC~~: NOT DETECTED
MDMA (ECSTASY) UR SCREEN: NOT DETECTED
METHADONE SCREEN, URINE: NOT DETECTED
Opiate, Ur Screen: POSITIVE — AB
Phencyclidine (PCP) Ur S: NOT DETECTED
TRICYCLIC, UR SCREEN: POSITIVE — AB

## 2015-11-18 LAB — BLOOD GAS, VENOUS
Acid-Base Excess: 2.3 mmol/L (ref 0.0–3.0)
Bicarbonate: 26.5 mEq/L (ref 21.0–28.0)
FIO2: 0.21
O2 Saturation: 86.5 %
PCO2 VEN: 39 mmHg — AB (ref 44.0–60.0)
PH VEN: 7.44 — AB (ref 7.320–7.430)
PO2 VEN: 50 mmHg — AB (ref 31.0–45.0)
Patient temperature: 37

## 2015-11-18 LAB — BRAIN NATRIURETIC PEPTIDE: B Natriuretic Peptide: 30 pg/mL (ref 0.0–100.0)

## 2015-11-18 LAB — TROPONIN I

## 2015-11-18 LAB — LIPASE, BLOOD: LIPASE: 23 U/L (ref 11–51)

## 2015-11-18 LAB — ETHANOL

## 2015-11-18 MED ORDER — ALBUTEROL SULFATE HFA 108 (90 BASE) MCG/ACT IN AERS: 2.0000 | INHALATION_SPRAY | Freq: Four times a day (QID) | RESPIRATORY_TRACT | Status: DC | PRN

## 2015-11-18 MED ORDER — IPRATROPIUM-ALBUTEROL 0.5-2.5 (3) MG/3ML IN SOLN
3.0000 mL | Freq: Once | RESPIRATORY_TRACT | Status: AC
  Administered 2015-11-18: 3 mL via RESPIRATORY_TRACT
  Filled 2015-11-18: qty 3

## 2015-11-18 MED ORDER — LEVOFLOXACIN 750 MG PO TABS: 750.0000 mg | ORAL_TABLET | Freq: Every day | ORAL | Status: DC

## 2015-11-18 MED ORDER — LEVOFLOXACIN IN D5W 750 MG/150ML IV SOLN
750.0000 mg | Freq: Once | INTRAVENOUS | Status: AC
  Administered 2015-11-18: 750 mg via INTRAVENOUS
  Filled 2015-11-18: qty 150

## 2015-11-18 MED ORDER — BENZONATATE 200 MG PO CAPS: 200.0000 mg | ORAL_CAPSULE | Freq: Three times a day (TID) | ORAL | Status: DC | PRN

## 2015-11-18 NOTE — ED Notes (Signed)
Pt is now c/o abdominal pain.   Abdomen is tender to palpation, and distended.    Pain radiates to back per patient.  Dr Mayford KnifeWilliams was notified.

## 2015-11-18 NOTE — ED Notes (Signed)
Pt accidentally dc'd own IV. Verbalized understanding of dc instructions prescritpions and follow up care

## 2015-11-18 NOTE — ED Notes (Addendum)
Pt to ed with c/o cough, congestion and fever since Wednesday.  Pt states went to urgent care yesterday and today and was sent here for eval of pneumonia,

## 2015-11-18 NOTE — ED Provider Notes (Signed)
[facility] Emergency Department Provider Note        Time seen: ----------------------------------------- 9:53 AM on 11/18/2015 -----------------------------------------    I have reviewed the triage vital signs and the nursing notes.   HISTORY  Chief Complaint Cough and Fever    HPI Donald Hart is a 45 y.o. male who presents ER for cough, congestion and fever since Wednesday night. Patient states he was in urgent care yesterday and today and was sent here to be evaluated for pneumonia.Patient was started on antibiotics yesterday and does not note any improvement in his symptoms. He feels weak and tired. Temperature yesterday was noted to be 103. The entire family has been sick as well.   Past Medical History  Diagnosis Date  . Chronic back pain   . Family history of heart attack     Father.   . Family history of diabetes mellitus     sister  . Family history of stomach cancer   . Hypertension   . Gout   . Hypertriglyceridemia   . Alcohol abuse   . Type 2 diabetes mellitus (HCC) 07/2015  . Acute pancreatitis 07/2015    hospitalization at [facility]    Patient Active Problem List   Diagnosis Date Noted  . Alcohol abuse   . Acute pancreatitis 07/03/2015  . Type 2 diabetes mellitus (HCC) 06/01/2015  . ARF (acute renal failure) (HCC) 01/19/2015  . Leukocytosis 01/19/2015    Past Surgical History  Procedure Laterality Date  . No past surgeries      Allergies Review of patient's allergies indicates no known allergies.  Social History Social History  Substance Use Topics  . Smoking status: Current Every Day Smoker -- 1.00 packs/day for 20 years    Types: Cigarettes  . Smokeless tobacco: Never Used  . Alcohol Use: 100.8 oz/week    12 Cans of beer per week     Comment: none since January hospitalization    Review of Systems Constitutional: Positive for fever Eyes: Negative for visual changes. ENT: Negative for sore  throat. Cardiovascular: Negative for chest pain. Respiratory: Positive for cough and congestion Gastrointestinal: Negative for abdominal pain, vomiting and diarrhea. Genitourinary: Negative for dysuria. Musculoskeletal: Negative for back pain. Skin: Negative for rash. Neurological: Negative for headaches, Positive for weakness  10-point ROS otherwise negative.  ____________________________________________   PHYSICAL EXAM:  VITAL SIGNS: ED Triage Vitals  Enc Vitals Group     BP 11/18/15 0939 139/90 mmHg     Pulse Rate 11/18/15 0939 99     Resp 11/18/15 0939 22     Temp 11/18/15 0939 98.6 F (37 C)     Temp Source 11/18/15 0939 Oral     SpO2 11/18/15 0939 92 %     Weight 11/18/15 0939 241 lb (109.317 kg)     Height 11/18/15 0939  (1.88 m)     Head Cir --      Peak Flow --      Pain Score 11/18/15 0940 7     Pain Loc --      Pain Edu? --      Excl. in GC? --     Constitutional: Alert and oriented. Mild distress, drowsy Eyes: Conjunctivae are normal. PERRL. Normal extraocular movements. ENT   Head: Normocephalic and atraumatic.   Nose: No congestion/rhinnorhea.   Mouth/Throat: Mucous membranes are moist.   Neck: No stridor. Cardiovascular: Normal rate, regular rhythm. No murmurs, rubs, or gallops. Respiratory: Normal respiratory effort without tachypnea  or retractions, crackles are noted in the right base Gastrointestinal: Soft and nontender. Normal bowel sounds Musculoskeletal: Nontender with normal range of motion in all extremities. No lower extremity tenderness nor edema. Neurologic:  Somewhat slurred speech, patient is drowsy but no focal neurologic deficits are appreciated Skin:  Skin is warm, dry and intact. No rash noted. Psychiatric: Mood and affect are normal. Speech and behavior are normal.  ____________________________________________  EKG: Interpreted by me.Sinus rhythm with a rate of 90 bpm, normal PR interval, normal QRS, normal QT  interval. Normal axis.  ____________________________________________  ED COURSE:  Pertinent labs & imaging results that were available during my care of the patient were reviewed by me and considered in my medical decision making (see chart for details). Patient presents with pneumonia like symptoms. We will check basic labs, imaging and reevaluate. ____________________________________________    LABS (pertinent positives/negatives)  Labs Reviewed  CBC WITH DIFFERENTIAL/PLATELET - Abnormal; Notable for the following:    WBC 16.1 (*)    Neutro Abs 11.0 (*)    Monocytes Absolute 1.7 (*)    Basophils Absolute 0.2 (*)    All other components within normal limits  COMPREHENSIVE METABOLIC PANEL - Abnormal; Notable for the following:    Potassium 3.4 (*)    Glucose, Bld 214 (*)    Calcium 8.8 (*)    All other components within normal limits  BLOOD GAS, VENOUS - Abnormal; Notable for the following:    pH, Ven 7.44 (*)    pCO2, Ven 39 (*)    pO2, Ven 50.0 (*)    All other components within normal limits  URINE DRUG SCREEN, QUALITATIVE ([facility] ONLY) - Abnormal; Notable for the following:    Tricyclic, Ur Screen POSITIVE (*)    Opiate, Ur Screen POSITIVE (*)    All other components within normal limits  CULTURE, BLOOD (ROUTINE X 2)  CULTURE, BLOOD (ROUTINE X 2)  TROPONIN I  BRAIN NATRIURETIC PEPTIDE  ETHANOL  LIPASE, BLOOD    RADIOLOGY Images were viewed by me  Chest x-ray IMPRESSION: 1. Subtle small area of right upper lobe pneumonia. No other acute cardiopulmonary disease. ____________________________________________  FINAL ASSESSMENT AND PLAN  Cough, Pneumonia  Plan: Patient with labs and imaging as dictated above. Patient presents the ER with symptoms of pneumonia. He was given IV Levaquin. His labs otherwise are stable with discharge and outpatient follow-up. He'll continue Levaquin and Tessalon. I do not feel comfortable giving a narcotic cough medicine because of his  history of alcoholism.   Emily FilbertWilliams, Starkisha Tullis E, MD   Note: This dictation was prepared with Dragon dictation. Any transcriptional errors that result from this process are unintentional   Emily FilbertJonathan E Bennie Scaff, MD 11/18/15 1226

## 2015-11-18 NOTE — ED Notes (Signed)
Levaquin infusing

## 2015-11-18 NOTE — Discharge Instructions (Signed)

## 2015-11-23 LAB — CULTURE, BLOOD (ROUTINE X 2)
Culture: NO GROWTH
Culture: NO GROWTH

## 2016-02-15 DIAGNOSIS — F17200 Nicotine dependence, unspecified, uncomplicated: Secondary | ICD-10-CM | POA: Insufficient documentation

## 2016-03-04 DIAGNOSIS — N179 Acute kidney failure, unspecified: Secondary | ICD-10-CM | POA: Insufficient documentation

## 2016-03-04 DIAGNOSIS — E119 Type 2 diabetes mellitus without complications: Secondary | ICD-10-CM | POA: Insufficient documentation

## 2016-03-04 DIAGNOSIS — J15212 Pneumonia due to Methicillin resistant Staphylococcus aureus: Secondary | ICD-10-CM | POA: Insufficient documentation

## 2016-03-04 DIAGNOSIS — R7881 Bacteremia: Secondary | ICD-10-CM | POA: Insufficient documentation

## 2016-07-12 ENCOUNTER — Encounter: Payer: Self-pay | Admitting: Emergency Medicine

## 2016-07-12 ENCOUNTER — Emergency Department: Payer: BLUE CROSS/BLUE SHIELD

## 2016-07-12 ENCOUNTER — Inpatient Hospital Stay
Admission: EM | Admit: 2016-07-12 | Discharge: 2016-07-15 | DRG: 439 | Disposition: A | Discharge status: Home / Self Care | Payer: BLUE CROSS/BLUE SHIELD | Attending: Internal Medicine | Admitting: Internal Medicine

## 2016-07-12 DIAGNOSIS — Z794 Long term (current) use of insulin: Secondary | ICD-10-CM

## 2016-07-12 DIAGNOSIS — E785 Hyperlipidemia, unspecified: Secondary | ICD-10-CM | POA: Diagnosis present

## 2016-07-12 DIAGNOSIS — Z833 Family history of diabetes mellitus: Secondary | ICD-10-CM

## 2016-07-12 DIAGNOSIS — J069 Acute upper respiratory infection, unspecified: Secondary | ICD-10-CM | POA: Diagnosis present

## 2016-07-12 DIAGNOSIS — K859 Acute pancreatitis without necrosis or infection, unspecified: Secondary | ICD-10-CM | POA: Diagnosis not present

## 2016-07-12 DIAGNOSIS — Z79899 Other long term (current) drug therapy: Secondary | ICD-10-CM

## 2016-07-12 DIAGNOSIS — R1013 Epigastric pain: Secondary | ICD-10-CM

## 2016-07-12 DIAGNOSIS — R079 Chest pain, unspecified: Secondary | ICD-10-CM

## 2016-07-12 DIAGNOSIS — F1010 Alcohol abuse, uncomplicated: Secondary | ICD-10-CM | POA: Diagnosis present

## 2016-07-12 DIAGNOSIS — E860 Dehydration: Secondary | ICD-10-CM | POA: Diagnosis present

## 2016-07-12 DIAGNOSIS — Z800 Family history of malignant neoplasm of digestive organs: Secondary | ICD-10-CM

## 2016-07-12 DIAGNOSIS — K5900 Constipation, unspecified: Secondary | ICD-10-CM | POA: Diagnosis present

## 2016-07-12 DIAGNOSIS — K760 Fatty (change of) liver, not elsewhere classified: Secondary | ICD-10-CM | POA: Diagnosis present

## 2016-07-12 DIAGNOSIS — E781 Pure hyperglyceridemia: Secondary | ICD-10-CM | POA: Diagnosis present

## 2016-07-12 DIAGNOSIS — R109 Unspecified abdominal pain: Secondary | ICD-10-CM | POA: Diagnosis not present

## 2016-07-12 DIAGNOSIS — Z8249 Family history of ischemic heart disease and other diseases of the circulatory system: Secondary | ICD-10-CM | POA: Diagnosis not present

## 2016-07-12 DIAGNOSIS — N179 Acute kidney failure, unspecified: Secondary | ICD-10-CM | POA: Diagnosis present

## 2016-07-12 DIAGNOSIS — E1165 Type 2 diabetes mellitus with hyperglycemia: Secondary | ICD-10-CM | POA: Diagnosis present

## 2016-07-12 DIAGNOSIS — I10 Essential (primary) hypertension: Secondary | ICD-10-CM | POA: Diagnosis present

## 2016-07-12 DIAGNOSIS — M109 Gout, unspecified: Secondary | ICD-10-CM | POA: Diagnosis present

## 2016-07-12 DIAGNOSIS — Z791 Long term (current) use of non-steroidal anti-inflammatories (NSAID): Secondary | ICD-10-CM

## 2016-07-12 DIAGNOSIS — F17210 Nicotine dependence, cigarettes, uncomplicated: Secondary | ICD-10-CM | POA: Diagnosis present

## 2016-07-12 LAB — BASIC METABOLIC PANEL
Anion gap: 15 (ref 5–15)
BUN: 21 mg/dL — ABNORMAL HIGH (ref 6–20)
CHLORIDE: 99 mmol/L — AB (ref 101–111)
CO2: 23 mmol/L (ref 22–32)
Calcium: 8.8 mg/dL — ABNORMAL LOW (ref 8.9–10.3)
Creatinine, Ser: 2.13 mg/dL — ABNORMAL HIGH (ref 0.61–1.24)
GFR calc non Af Amer: 36 mL/min — ABNORMAL LOW (ref >60)
GFR, EST AFRICAN AMERICAN: 42 mL/min — AB (ref >60)
Glucose, Bld: 241 mg/dL — ABNORMAL HIGH (ref 65–99)
POTASSIUM: 3.2 mmol/L — AB (ref 3.5–5.1)
SODIUM: 137 mmol/L (ref 135–145)

## 2016-07-12 LAB — GLUCOSE, CAPILLARY
GLUCOSE-CAPILLARY: 188 mg/dL — AB (ref 65–99)
Glucose-Capillary: 180 mg/dL — ABNORMAL HIGH (ref 65–99)

## 2016-07-12 LAB — HEPATIC FUNCTION PANEL
ALBUMIN: 3.6 g/dL (ref 3.5–5.0)
ALK PHOS: 137 U/L — AB (ref 38–126)
ALT: 80 U/L — AB (ref 17–63)
AST: 115 U/L — AB (ref 15–41)
Bilirubin, Direct: 1.3 mg/dL — ABNORMAL HIGH (ref 0.1–0.5)
Indirect Bilirubin: 1.3 mg/dL — ABNORMAL HIGH (ref 0.3–0.9)
TOTAL PROTEIN: 8.3 g/dL — AB (ref 6.5–8.1)
Total Bilirubin: 2.6 mg/dL — ABNORMAL HIGH (ref 0.3–1.2)

## 2016-07-12 LAB — CBC
HEMATOCRIT: 42 % (ref 40.0–52.0)
HEMOGLOBIN: 14.9 g/dL (ref 13.0–18.0)
MCH: 31.3 pg (ref 26.0–34.0)
MCHC: 35.6 g/dL (ref 32.0–36.0)
MCV: 88 fL (ref 80.0–100.0)
Platelets: 141 10*3/uL — ABNORMAL LOW (ref 150–440)
RBC: 4.77 MIL/uL (ref 4.40–5.90)
RDW: 14.1 % (ref 11.5–14.5)
WBC: 11.5 10*3/uL — ABNORMAL HIGH (ref 3.8–10.6)

## 2016-07-12 LAB — TROPONIN I: Troponin I: 0.03 ng/mL (ref <0.03)

## 2016-07-12 LAB — LIPASE, BLOOD: Lipase: 16 U/L (ref 11–51)

## 2016-07-12 MED ORDER — PREGABALIN 50 MG PO CAPS
100.0000 mg | ORAL_CAPSULE | Freq: Three times a day (TID) | ORAL | Status: DC
  Administered 2016-07-12 – 2016-07-15 (×9): 100 mg via ORAL
  Filled 2016-07-12 (×9): qty 2

## 2016-07-12 MED ORDER — ACETAMINOPHEN 650 MG RE SUPP: 650.0000 mg | Freq: Four times a day (QID) | RECTAL | Status: DC | PRN

## 2016-07-12 MED ORDER — ENOXAPARIN SODIUM 40 MG/0.4ML ~~LOC~~ SOLN
40.0000 mg | SUBCUTANEOUS | Status: DC
  Administered 2016-07-12 – 2016-07-14 (×3): 40 mg via SUBCUTANEOUS
  Filled 2016-07-12 (×3): qty 0.4

## 2016-07-12 MED ORDER — BENZONATATE 100 MG PO CAPS
200.0000 mg | ORAL_CAPSULE | Freq: Three times a day (TID) | ORAL | Status: DC | PRN
  Administered 2016-07-12 – 2016-07-14 (×2): 200 mg via ORAL
  Filled 2016-07-12 (×2): qty 2

## 2016-07-12 MED ORDER — INSULIN REGULAR HUMAN 100 UNIT/ML IJ SOLN: 14.0000 [IU] | Freq: Three times a day (TID) | INTRAMUSCULAR | Status: DC

## 2016-07-12 MED ORDER — CARVEDILOL 6.25 MG PO TABS
6.2500 mg | ORAL_TABLET | Freq: Two times a day (BID) | ORAL | Status: DC
  Administered 2016-07-12 – 2016-07-15 (×6): 6.25 mg via ORAL
  Filled 2016-07-12 (×6): qty 1

## 2016-07-12 MED ORDER — HYDROMORPHONE HCL 1 MG/ML IJ SOLN
1.0000 mg | Freq: Once | INTRAMUSCULAR | Status: AC
  Administered 2016-07-12: 1 mg via INTRAVENOUS
  Filled 2016-07-12: qty 1

## 2016-07-12 MED ORDER — ONDANSETRON HCL 4 MG/2ML IJ SOLN
4.0000 mg | Freq: Once | INTRAMUSCULAR | Status: AC
  Administered 2016-07-12: 4 mg via INTRAVENOUS
  Filled 2016-07-12: qty 2

## 2016-07-12 MED ORDER — FAMOTIDINE IN NACL 20-0.9 MG/50ML-% IV SOLN
20.0000 mg | Freq: Once | INTRAVENOUS | Status: AC
  Administered 2016-07-12: 20 mg via INTRAVENOUS
  Filled 2016-07-12: qty 50

## 2016-07-12 MED ORDER — ONDANSETRON HCL 4 MG/2ML IJ SOLN: 4.0000 mg | Freq: Four times a day (QID) | INTRAMUSCULAR | Status: DC | PRN

## 2016-07-12 MED ORDER — INSULIN NPH (HUMAN) (ISOPHANE) 100 UNIT/ML ~~LOC~~ SUSP: 14.0000 [IU] | Freq: Every day | SUBCUTANEOUS | Status: DC

## 2016-07-12 MED ORDER — AMLODIPINE BESYLATE 10 MG PO TABS
10.0000 mg | ORAL_TABLET | Freq: Every day | ORAL | Status: DC
  Administered 2016-07-12 – 2016-07-15 (×4): 10 mg via ORAL
  Filled 2016-07-12 (×4): qty 1

## 2016-07-12 MED ORDER — ONDANSETRON HCL 4 MG PO TABS: 4.0000 mg | ORAL_TABLET | Freq: Four times a day (QID) | ORAL | Status: DC | PRN

## 2016-07-12 MED ORDER — IOPAMIDOL (ISOVUE-300) INJECTION 61%
30.0000 mL | Freq: Once | INTRAVENOUS | Status: AC
  Administered 2016-07-12: 30 mL via ORAL

## 2016-07-12 MED ORDER — SODIUM CHLORIDE 0.9 % IV SOLN
Freq: Once | INTRAVENOUS | Status: AC
  Administered 2016-07-12: 14:00:00 via INTRAVENOUS

## 2016-07-12 MED ORDER — SODIUM CHLORIDE 0.9 % IV SOLN
Freq: Once | INTRAVENOUS | Status: AC
  Administered 2016-07-12: 18:00:00 via INTRAVENOUS

## 2016-07-12 MED ORDER — INSULIN ASPART 100 UNIT/ML ~~LOC~~ SOLN
0.0000 [IU] | Freq: Three times a day (TID) | SUBCUTANEOUS | Status: DC
  Administered 2016-07-14: 1 [IU] via SUBCUTANEOUS
  Filled 2016-07-12: qty 1

## 2016-07-12 MED ORDER — INSULIN ASPART 100 UNIT/ML ~~LOC~~ SOLN: 14.0000 [IU] | Freq: Three times a day (TID) | SUBCUTANEOUS | Status: DC

## 2016-07-12 MED ORDER — HYDROCODONE-ACETAMINOPHEN 5-325 MG PO TABS
1.0000 | ORAL_TABLET | ORAL | Status: DC | PRN
  Administered 2016-07-13 – 2016-07-15 (×10): 2 via ORAL
  Filled 2016-07-12 (×10): qty 2

## 2016-07-12 MED ORDER — POTASSIUM CHLORIDE IN NACL 40-0.9 MEQ/L-% IV SOLN
INTRAVENOUS | Status: DC
  Administered 2016-07-12 – 2016-07-15 (×4): 125 mL/h via INTRAVENOUS
  Filled 2016-07-12 (×12): qty 1000

## 2016-07-12 MED ORDER — GEMFIBROZIL 600 MG PO TABS
600.0000 mg | ORAL_TABLET | Freq: Two times a day (BID) | ORAL | Status: DC
  Administered 2016-07-13 – 2016-07-15 (×5): 600 mg via ORAL
  Filled 2016-07-12 (×6): qty 1

## 2016-07-12 MED ORDER — HYDROMORPHONE HCL 1 MG/ML IJ SOLN
2.0000 mg | INTRAMUSCULAR | Status: DC | PRN
  Administered 2016-07-12 – 2016-07-15 (×15): 2 mg via INTRAVENOUS
  Filled 2016-07-12 (×15): qty 2

## 2016-07-12 MED ORDER — ATORVASTATIN CALCIUM 20 MG PO TABS
40.0000 mg | ORAL_TABLET | Freq: Every day | ORAL | Status: DC
  Administered 2016-07-12 – 2016-07-15 (×3): 40 mg via ORAL
  Filled 2016-07-12 (×4): qty 2

## 2016-07-12 MED ORDER — ACETAMINOPHEN 325 MG PO TABS
650.0000 mg | ORAL_TABLET | Freq: Four times a day (QID) | ORAL | Status: DC | PRN
  Administered 2016-07-13: 650 mg via ORAL
  Filled 2016-07-12: qty 2

## 2016-07-12 MED ORDER — ALLOPURINOL 100 MG PO TABS
200.0000 mg | ORAL_TABLET | Freq: Every day | ORAL | Status: DC
  Administered 2016-07-12 – 2016-07-15 (×4): 200 mg via ORAL
  Filled 2016-07-12 (×4): qty 2

## 2016-07-12 MED ORDER — INFLUENZA VAC SPLIT QUAD 0.5 ML IM SUSY
0.5000 mL | PREFILLED_SYRINGE | INTRAMUSCULAR | Status: AC
  Administered 2016-07-15: 0.5 mL via INTRAMUSCULAR
  Filled 2016-07-12: qty 0.5

## 2016-07-12 MED ORDER — INSULIN DETEMIR 100 UNIT/ML ~~LOC~~ SOLN
14.0000 [IU] | Freq: Every day | SUBCUTANEOUS | Status: DC
  Administered 2016-07-12 – 2016-07-14 (×3): 14 [IU] via SUBCUTANEOUS
  Filled 2016-07-12 (×4): qty 0.14

## 2016-07-12 MED ORDER — ALBUTEROL SULFATE (2.5 MG/3ML) 0.083% IN NEBU: 3.0000 mL | INHALATION_SOLUTION | Freq: Four times a day (QID) | RESPIRATORY_TRACT | Status: DC | PRN

## 2016-07-12 MED ORDER — SODIUM CHLORIDE 0.9 % IV SOLN
Freq: Once | INTRAVENOUS | Status: AC
  Administered 2016-07-12: 16:00:00 via INTRAVENOUS

## 2016-07-12 NOTE — ED Triage Notes (Signed)
Pt in via POV with complaints of abdominal pain, chest pain and shortness of breath x 2-3 days, worsening today.  Pt tachycardic, diaphoretic at this time.  Pt reports hx of pancreatitis.  Pt denies any N/V/D.

## 2016-07-12 NOTE — ED Notes (Signed)
Patient transported to US 

## 2016-07-12 NOTE — H&P (Signed)
[employer] - [place] [facility]   PATIENT NAME: Donald Hart    MR#:  [id number]  DATE OF BIRTH:  05-24-1971  DATE OF ADMISSION:  07/12/2016  PRIMARY CARE PHYSICIAN: [name], MD   REQUESTING/REFERRING PHYSICIAN: Minna Antis MD  CHIEF COMPLAINT:   Chief Complaint  Patient presents with  . Chest Pain    HISTORY OF PRESENT ILLNESS: Donald Hart  is a 46 y.o. male with a known history of Alcohol abuse in the past, pancreatitis due to hypertriglyceridemia,  essential hypertension, hyperlipemia who presents with abdominal pain on going for past few days. He has also been having some nausea and vomiting. He also complains of some substernal chest pain as well as intermittent shortness of breath. Patient in the ER was noted to have a normal lipase however his creatinine was elevated. He was also noticed to have CT scan that showed pancreatitis. Therefore we are asked to admit him.    PAST MEDICAL HISTORY:   Past Medical History:  Diagnosis Date  . Acute pancreatitis 07/2015   hospitalization at [facility]  . Alcohol abuse   . Chronic back pain   . Family history of diabetes mellitus    sister  . Family history of heart attack    Father.   . Family history of stomach cancer   . Gout   . Hypertension   . Hypertriglyceridemia   . Type 2 diabetes mellitus (HCC) 07/2015    PAST SURGICAL HISTORY: Past Surgical History:  Procedure Laterality Date  . NO PAST SURGERIES      SOCIAL HISTORY:  Social History  Substance Use Topics  . Smoking status: Current Every Day Smoker    Packs/day: 1.00    Years: 20.00    Types: Cigarettes  . Smokeless tobacco: Never Used  . Alcohol use 100.8 oz/week    12 Cans of beer per week     Comment: none since January hospitalization    FAMILY HISTORY:  Family History  Problem Relation Age of Onset  . Adopted: Yes  . Heart attack Father   . Stomach cancer Father   . Diabetes Father   . Diabetes Sister    . Diabetes Sister     DRUG ALLERGIES: No Known Allergies  REVIEW OF SYSTEMS:   CONSTITUTIONAL: No fever, fatigue or weakness.  [name], NOSE, [name], shortness of breath, wheezing or hemoptysis.  CARDIOVASCULAR: + chest pain, orthopnea, edema.  GASTROINTESTINAL: Positive nausea, vomiting, diarrhea or positive abdominal pain.  GENITOURINARY: No dysuria, [name], nocturia,  [name], [name], numbness, weakness.  PSYCHIATRY: No anxiety or depression.   MEDICATIONS AT HOME:  Prior to Admission medications   Medication Sig Start Date End Date Taking? Authorizing Provider  allopurinol (ZYLOPRIM) 100 MG tablet Take 2 tablets by mouth daily. 05/17/15  Yes Historical Provider, MD  amLODipine (NORVASC) 10 MG tablet Take 1 tablet by mouth daily. 05/17/15  Yes Historical Provider, MD  atorvastatin (LIPITOR) 40 MG tablet Take 40 mg by mouth daily. Reported on 07/21/2015 07/12/15 07/12/16 Yes Historical Provider, MD  carvedilol (COREG) 6.25 MG tablet Take 1 tablet by mouth 2 (two) times daily. 05/22/15  Yes Historical Provider, MD  gemfibrozil (LOPID) 600 MG tablet Take 1 tablet (600 mg total) by mouth  2 (two) times daily before a meal. 07/09/15  Yes Altamese Dilling, MD  ibuprofen (ADVIL,MOTRIN) 200 MG tablet Take 800 mg by mouth every 8 (eight) hours as needed.    Yes Historical Provider, MD  insulin NPH Human (HUMULIN N,NOVOLIN N) 100 UNIT/ML injection Inject 14 Units into the skin at bedtime. Reported on 07/21/2015 07/17/15 07/16/16 Yes Historical Provider, MD  insulin regular (NOVOLIN R,HUMULIN R) 100 units/mL injection Inject 14 Units into the skin 3 (three) times daily before meals. Plus sliding scale 07/17/15 07/16/16 Yes Historical Provider, MD  pregabalin  (LYRICA) 100 MG capsule Take 100 mg by mouth 3 (three) times daily. 07/12/15 07/12/16 Yes Historical Provider, MD  albuterol (PROVENTIL HFA;VENTOLIN HFA) 108 (90 Base) MCG/ACT inhaler Inhale 2 puffs into the lungs every 6 (six) hours as needed for wheezing or shortness of breath. 11/18/15   Emily Filbert, MD  benzonatate (TESSALON) 200 MG capsule Take 1 capsule (200 mg total) by mouth 3 (three) times daily as needed for cough. 11/18/15   Emily Filbert, MD  levofloxacin (LEVAQUIN) 750 MG tablet Take 1 tablet (750 mg total) by mouth daily. 11/18/15   Emily Filbert, MD      PHYSICAL EXAMINATION:   VITAL SIGNS: Blood pressure 138/80, pulse 90, resp. rate 16, height 6\' 2"  (1.88 m), weight 250 lb (113.4 kg), SpO2 99 %.  GENERAL:  46 y.o.-year-old patient lying in the bed with no acute distress.  EYES: Pupils equal, round, [name], normocephalic. Oropharynx and nasopharynx clear.  NECK:  Supple, [name], [name], no wheezing, rales,[name], [name], rubs, or gallops.  ABDOMEN: Epigastric tenderness nondistended. Bowel sounds present. No organomegaly or mass.  EXTREMITIES: No pedal edema, cyanosis, or clubbing.  NEUROLOGIC: Cranial nerves II through XII are intact. Muscle strength 5/5 in all extremities. Sensation intact. Gait not checked.  PSYCHIATRIC: The patient is alert and oriented x 3.  SKIN: No obvious rash, lesion, or ulcer.   LABORATORY PANEL:   CBC  Recent Labs Lab 07/12/16 1331  WBC 11.5*  HGB 14.9  HCT 42.0  PLT 141*  MCV 88.0  MCH 31.3  MCHC 35.6  RDW 14.1    ------------------------------------------------------------------------------------------------------------------  Chemistries   Recent Labs Lab 07/12/16 1331  NA 137  K 3.2*  CL 99*  CO2 23  GLUCOSE 241*  BUN 21*  CREATININE 2.13*  CALCIUM 8.8*  AST 115*  ALT 80*  ALKPHOS 137*  BILITOT 2.6*   ------------------------------------------------------------------------------------------------------------------ estimated creatinine clearance is 59.9 mL/min (by C-G formula based on SCr of 2.13 mg/dL (H)). ------------------------------------------------------------------------------------------------------------------ No results for input(s): TSH, T4TOTAL, T3FREE, THYROIDAB in the last 72 hours.  Invalid input(s): FREET3   Coagulation profile No results for input(s): INR, PROTIME in the last 168 hours. ------------------------------------------------------------------------------------------------------------------- No results for input(s): DDIMER in the last 72 hours. -------------------------------------------------------------------------------------------------------------------  Cardiac Enzymes  Recent Labs Lab 07/12/16 1331 07/12/16 1621  TROPONINI 0.03* <0.03   ------------------------------------------------------------------------------------------------------------------ Invalid input(s): POCBNP  ---------------------------------------------------------------------------------------------------------------  Urinalysis    Component Value Date/Time   COLORURINE YELLOW (A) 07/03/2015 1224   APPEARANCEUR CLEAR (A) 07/03/2015 1224   APPEARANCEUR Clear 09/22/2013 1102   LABSPEC 1.031 (H) 07/03/2015 1224   LABSPEC 1.006 09/22/2013 1102   PHURINE 5.0 07/03/2015 1224   GLUCOSEU >500 (A) 07/03/2015 1224  GLUCOSEU Negative 09/22/2013 1102   HGBUR 1+ (A) 07/03/2015 1224   BILIRUBINUR NEGATIVE 07/03/2015 1224   BILIRUBINUR Negative 09/22/2013 1102   KETONESUR  1+ (A) 07/03/2015 1224   PROTEINUR NEGATIVE 07/03/2015 1224   NITRITE NEGATIVE 07/03/2015 1224   LEUKOCYTESUR NEGATIVE 07/03/2015 1224   LEUKOCYTESUR Negative 09/22/2013 1102     RADIOLOGY: Ct Abdomen Pelvis Wo Contrast  Result Date: 07/12/2016 CLINICAL DATA:  Epigastric pain. [name]. Tachycardic. History of pancreatitis. EXAM: CT ABDOMEN AND PELVIS WITHOUT CONTRAST TECHNIQUE: Multidetector CT imaging of the abdomen and pelvis was performed following the standard protocol without IV contrast. COMPARISON:  07/12/2016 abdominal ultrasound.  CT of 07/03/2015. FINDINGS: Lower chest: Bibasilar atelectasis. Mild cardiomegaly, without pericardial or pleural effusion. Hepatobiliary: Moderate hepatic steatosis. Sparing adjacent the gallbladder. Hepatomegaly at 28 cm craniocaudal. Normal gallbladder, without biliary ductal dilatation. Pancreas: Subtle pancreatic and peripancreatic edema, most apparent adjacent the pancreatic head. No duct dilatation or focal peripancreatic fluid collection. Spleen: Normal in size, without focal abnormality. Adrenals/Urinary Tract: Normal adrenal glands. Interpolar right renal lesion is too small to characterize but likely a cyst. Normal left kidney, without hydronephrosis or hydroureter. No bladder calculi. Stomach/Bowel: Normal stomach, without wall thickening. Normal colon, appendix, and terminal ileum. Normal small bowel. Vascular/Lymphatic: Aortic and branch vessel atherosclerosis. 12 mm aortocaval node is upper normal in size and likely reactive. No pelvic sidewall adenopathy. Reproductive: Normal prostate. Other: No significant free fluid. Bilateral fat containing inguinal hernias. Musculoskeletal: No acute osseous abnormality. IMPRESSION: 1. Subtle findings which are suspicious for non complicated acute pancreatitis. 2. Hepatic steatosis and hepatomegaly. 3. Age advanced atherosclerosis. Electronically Signed   By: Jeronimo GreavesKyle  Talbot M.D.   On: 07/12/2016 17:34   Dg  Chest 2 View  Result Date: 07/12/2016 CLINICAL DATA:  Chest pain.  [name]. EXAM: CHEST  2 VIEW COMPARISON:  11/18/2015 FINDINGS: The heart size and mediastinal contours are within normal limits. Both lungs are clear. The visualized skeletal structures are unremarkable. IMPRESSION: No active cardiopulmonary disease. Electronically Signed   By: Francene BoyersJames  Maxwell M.D.   On: 07/12/2016 13:59   [place] Abdomen Limited Ruq  Result Date: 07/12/2016 CLINICAL DATA:  Upper abdominal pain for 2 days EXAM: US ABDOMEN LIMITED - RIGHT UPPER QUADRANT COMPARISON:  CT scan 07/03/15 FINDINGS: Gallbladder: No gallstones or wall thickening visualized. No sonographic Murphy sign noted by sonographer. Common bile duct: Diameter: 4 mm in diameter within normal limits Liver: Multiple hypoechoic lesions are noted in right hepatic lobe of the liver. Under largest inferior aspect of the right hepatic lobe measures 2.6 x 2.7 cm. Mid aspect of the right lobe lesion measures 1.5 x 1.4 cm. Lateral inferior aspect of the right hepatic lobe measures 1.6 x 1.3 cm. Further correlation with enhanced CT or MRI is recommended to exclude metastatic disease. IMPRESSION: 1. No gallstones are noted within gallbladder. Normal CBD. Hyperechoic lesions are noted within right hepatic further evaluation with enhanced CT or MRI is recommended to exclude metastatic disease. These results were called by telephone at the time of interpretation on 07/12/2016 at 4:37 pm to Dr. Jeri LagerPaducowski, who verbally acknowledged these results. Electronically Signed   By: Natasha MeadLiviu  Pop M.D.   On: 07/12/2016 16:56    EKG: Orders placed or performed during the hospital encounter of 07/12/16  . EKG 12-Lead  . EKG 12-Lead  . ED EKG within 10 minutes  . ED EKG within 10 minutes    IMPRESSION AND PLAN: Patient is a 46 year old white male presenting with  abdominal pain noted to have acute pancreatitis  1. Acute pancreatitis with history of hypertriglyceridemia We'll  check triglyceride levels. We'll keep him nothing by mouth with pain control  2. Chest pain we'll cycle cardiac enzymes  3. Elevated LFTs could be due to fatty liver as well as his alcohol abuse will need follow-up liver function tests  4. Diabetes with elevated blood sugar I will continue insulin place him on sliding scale insulin  5. Acute renal failure likely related to dehydration we'll give IV fluids  6. Misc: lovenox for dvt proph All the records are reviewed and case discussed with ED provider. Management plans discussed with the patient, family and they are in agreement.  CODE STATUS: Code Status History    Date Active Date Inactive Code Status Order ID Comments User Context   07/03/2015  2:31 PM 07/09/2015  5:55 PM Full Code [id number]  Alford Highland, MD ED   01/19/2015  6:50 PM 01/21/2015  1:37 PM Full Code [id number]  Shaune Pollack, MD Inpatient       TOTAL TIME TAKING CARE OF THIS PATIENT:26minutes.    Auburn Bilberry M.D on 07/12/2016 at 6:26 PM  Between 7am to 6pm - Pager - [phone]  After 6pm go to [email or web address] - password [email or web address] [facility]  [facility]  Office  [phone]  CC: Primary care physician; [name], MD

## 2016-07-12 NOTE — ED Notes (Signed)
Pt in NAD at this time. Pt stating that he was admitted in August with pancreatitis previously. Pt is watching TV at this time.

## 2016-07-12 NOTE — ED Provider Notes (Signed)
[facility] Emergency Department Provider Note        Time seen: ----------------------------------------- 1:58 PM on 07/12/2016 -----------------------------------------    I have reviewed the triage vital signs and the nursing notes.   HISTORY  Chief Complaint Chest Pain    HPI Donald Hart is a 46 y.o. male who presents to ER with complaints of abdominal pain and chest pain with shortness of breath for 2-3 days. Symptoms worsening today. He presents tachycardic and diaphoretic. He reports history of pancreatitis but denies recent alcohol abuse. He denies any nausea, vomiting or diarrhea. He denies   Past Medical History:  Diagnosis Date  . Acute pancreatitis 07/2015   hospitalization at [facility]  . Alcohol abuse   . Chronic back pain   . Family history of diabetes mellitus    sister  . Family history of heart attack    Father.   . Family history of stomach cancer   . Gout   . Hypertension   . Hypertriglyceridemia   . Type 2 diabetes mellitus (HCC) 07/2015    Patient Active Problem List   Diagnosis Date Noted  . Alcohol abuse   . Acute pancreatitis 07/03/2015  . Type 2 diabetes mellitus (HCC) 06/01/2015  . ARF (acute renal failure) (HCC) 01/19/2015  . Leukocytosis 01/19/2015    Past Surgical History:  Procedure Laterality Date  . NO PAST SURGERIES      Allergies Patient has no known allergies.  Social History Social History  Substance Use Topics  . Smoking status: Current Every Day Smoker    Packs/day: 1.00    Years: 20.00    Types: Cigarettes  . Smokeless tobacco: Never Used  . Alcohol use 100.8 oz/week    12 Cans of beer per week     Comment: none since January hospitalization    Review of Systems Constitutional: Negative for fever. Cardiovascular: Positive for chest pain Respiratory: Positive for shortness of breath Gastrointestinal: Positive for abdominal pain Genitourinary: Negative for  dysuria. Musculoskeletal: Negative for back pain. Skin: Positive for diaphoresis Neurological: Negative for headaches, focal weakness or numbness.  10-point ROS otherwise negative.  ____________________________________________   PHYSICAL EXAM:  VITAL SIGNS: ED Triage Vitals  Enc Vitals Group     BP 07/12/16 1329 140/87     Pulse Rate 07/12/16 1329 (!) 113     Resp 07/12/16 1329 16     Temp --      Temp src --      SpO2 07/12/16 1329 100 %     Weight 07/12/16 1316 250 lb (113.4 kg)     Height 07/12/16 1316 6\' 2"  (1.88 m)     Head Circumference --      Peak Flow --      Pain Score 07/12/16 1316 8     Pain Loc --      Pain Edu? --      Excl. in GC? --     Constitutional: Alert and oriented. Mild to moderate distress Eyes: Conjunctivae are normal. PERRL. Normal extraocular movements. ENT   Head: Normocephalic and atraumatic.   Nose: No congestion/rhinnorhea.   Mouth/Throat: Mucous membranes are moist.   Neck: No stridor. Cardiovascular: Rapid rate, regular rhythm. No murmurs, rubs, or gallops. Respiratory: Normal respiratory effort without tachypnea nor retractions. Breath sounds are clear and equal bilaterally. No wheezes/rales/rhonchi. Gastrointestinal: Epigastric tenderness, no rebound or guarding. Normal bowel sounds. Musculoskeletal: Nontender with normal range of motion in all extremities. No lower extremity tenderness nor edema.  Neurologic:  Normal speech and language. No gross focal neurologic deficits are appreciated.  Skin:  Skin is warm, with diaphoresis noted Psychiatric: Mood and affect are normal. Speech and behavior are normal.  ____________________________________________  EKG: Interpreted by me. Sinus tachycardia with a rate of 126 bpm, normal PR interval, normal QRS, normal QT, normal axis.  ____________________________________________  ED COURSE:  Pertinent labs & imaging results that were available during my care of the patient were  reviewed by me and considered in my medical decision making (see chart for details). Clinical Course   Patient presents to the ER for likely recurrent pancreatitis from alcohol. We will assess with labs and possibly imaging.  Procedures ____________________________________________   LABS (pertinent positives/negatives)  Labs Reviewed  BASIC METABOLIC PANEL - Abnormal; Notable for the following:       Result Value   Potassium 3.2 (*)    Chloride 99 (*)    Glucose, Bld 241 (*)    BUN 21 (*)    Creatinine, Ser 2.13 (*)    Calcium 8.8 (*)    GFR calc non Af Amer 36 (*)    GFR calc Af Amer 42 (*)    All other components within normal limits  CBC - Abnormal; Notable for the following:    WBC 11.5 (*)    Platelets 141 (*)    All other components within normal limits  TROPONIN I - Abnormal; Notable for the following:    Troponin I 0.03 (*)    All other components within normal limits  LIPASE, BLOOD   Chest x-ray does not reveal any acute cardiopulmonary disease ____________________________________________  FINAL ASSESSMENT AND PLAN  Abdominal pain  Plan: Patient with labs and imaging as dictated above. Patient clinically with pancreatitis, CT and US are pending at this time.    Emily FilbertWilliams, Jonathan E, MD   Note: This dictation was prepared with Dragon dictation. Any transcriptional errors that result from this process are unintentional    Emily FilbertJonathan E Williams, MD 07/13/16 1506

## 2016-07-12 NOTE — ED Notes (Signed)
Pt was transferred to 1A room 158, the floor was called prior to departure

## 2016-07-12 NOTE — ED Provider Notes (Signed)
-----------------------------------------   5:55 PM on 07/12/2016 -----------------------------------------  Patient care assumed from Dr. Mayford KnifeWilliams. Patient continues to have 8/10 upper abdominal pain associated with nausea. Patient's labs significant for acute renal insufficiency, with mild leukocytosis.  Ultrasound shows no gallstones, normal CBD possible liver lesions. CT scan consistent with non-complicated acute pancreatitis. Which fits the patient's current presentation. The patient's continued pain and nausea we will admit to the hospital. We will start on IV fluids infusion along with pain medication as needed.   Minna AntisKevin Samora Jernberg, MD 07/12/16 1757

## 2016-07-12 NOTE — ED Notes (Signed)
Patient transported to X-ray 

## 2016-07-13 LAB — LIPID PANEL
CHOL/HDL RATIO: 13.7 ratio
CHOLESTEROL: 164 mg/dL (ref 0–200)
HDL: 12 mg/dL — ABNORMAL LOW (ref >40)
LDL Cholesterol: UNDETERMINED mg/dL (ref 0–99)
Triglycerides: 805 mg/dL — ABNORMAL HIGH (ref <150)
VLDL: UNDETERMINED mg/dL (ref 0–40)

## 2016-07-13 LAB — GLUCOSE, CAPILLARY
GLUCOSE-CAPILLARY: 147 mg/dL — AB (ref 65–99)
GLUCOSE-CAPILLARY: 132 mg/dL — AB (ref 65–99)
Glucose-Capillary: 107 mg/dL — ABNORMAL HIGH (ref 65–99)
Glucose-Capillary: 109 mg/dL — ABNORMAL HIGH (ref 65–99)
Glucose-Capillary: 110 mg/dL — ABNORMAL HIGH (ref 65–99)

## 2016-07-13 LAB — CBC
HCT: 39.4 % — ABNORMAL LOW (ref 40.0–52.0)
HEMOGLOBIN: 14 g/dL (ref 13.0–18.0)
MCH: 31.4 pg (ref 26.0–34.0)
MCHC: 35.6 g/dL (ref 32.0–36.0)
MCV: 88.4 fL (ref 80.0–100.0)
PLATELETS: 129 10*3/uL — AB (ref 150–440)
RBC: 4.45 MIL/uL (ref 4.40–5.90)
RDW: 13.9 % (ref 11.5–14.5)
WBC: 9.7 10*3/uL (ref 3.8–10.6)

## 2016-07-13 LAB — INFLUENZA PANEL BY PCR (TYPE A & B)
Influenza A By PCR: NEGATIVE
Influenza B By PCR: NEGATIVE

## 2016-07-13 LAB — HEMOGLOBIN A1C
HEMOGLOBIN A1C: 7.1 % — AB (ref 4.8–5.6)
MEAN PLASMA GLUCOSE: 157 mg/dL

## 2016-07-13 LAB — TROPONIN I
Troponin I: 0.03 ng/mL (ref <0.03)
Troponin I: <0.03 ng/mL (ref <0.03)

## 2016-07-13 MED ORDER — GUAIFENESIN-DM 100-10 MG/5ML PO SYRP: 10.0000 mL | ORAL_SOLUTION | Freq: Four times a day (QID) | ORAL | Status: DC | PRN

## 2016-07-13 NOTE — Progress Notes (Signed)
Shift assessment completed at 0730.Pt appears a little flushed at that time, is alert and oriented. Pt is on room air, lungs are clear bilat, telebox in place, HR is regular. Abdomen is soft, tender to r side, bs hypoactive. Pt is voiding in urinal prn. Ppp, no edema noted. piv #20 intact to rac, site is free of redness and swelling, iv ns with 40meq kcl infusing at 15525mls/hr. Pt rating pain to abdomen at 8/10 "and climbing". Pain med schedule reviewed with pt. Since assessment, Dr. Amado CoeGouru has rounded, pt has had flu swab sent, urine culture sent, blood cultures drawn,etc. Pt reported harsh loose cough to doctor, this writer has heard this once. Pt has received vicodin once for pain, and dilaudid iv twice for pain since assessment.Pt is resting in bed at this time, remains npo. Call bell in reach.

## 2016-07-13 NOTE — Plan of Care (Signed)
Problem: Bowel/Gastric: Goal: Will not experience complications related to bowel motility Outcome: Progressing Pt is working toward goals for discharge.

## 2016-07-13 NOTE — Progress Notes (Signed)
[facility] [place] at [facility]   PATIENT NAME: Donald Hart    MR#:  [id number]  DATE OF BIRTH:  24-Oct-1970  SUBJECTIVE:  CHIEF COMPLAINT:  Patient was febrile this a.m. Complaining of abdominal pain better than yesterday. Reporting nausea. Reports cough and nasal stuffiness, denies any diarrhea  REVIEW OF SYSTEMS:  CONSTITUTIONAL: No fever, fatigue or weakness.  [name], NOSE, AND THROAT:Reporting nasal stuffiness and cough No tinnitus or ear pain.  RESPIRATORY:   reportscough,  deniesshortness of breath, [name], orthopnea, edema.  GASTROINTESTINAL: No nausea, vomiting, diarrhea or reporting epigastric  abdominal pain.  GENITOURINARY: No dysuria, [name], nocturia,  [name], [name], numbness, weakness.  PSYCHIATRY: No anxiety or depression.   DRUG ALLERGIES:  No Known Allergies  VITALS:  Blood pressure 118/71, pulse 82, temperature 98.4 F (36.9 C), temperature source Oral, resp. rate 16, height 6\' 2"  (1.88 m), weight 113.4 kg (250 lb), SpO2 91 %.  PHYSICAL EXAMINATION:  GENERAL:  46 y.o.-year-old patient lying in the bed with no acute distress.  EYES: Pupils equal, round, [name], normocephalic. Oropharyn clear.  Needs are congested with hypertrophied turbinates, postnasal drip is present NECK:  Supple, [name], no tenderness.  LUNGS: Moderate  breath sounds bilaterally, no wheezing, rales,[name], [name], rubs, [name], left upper quadrant tenderness is  present no rebound tenderness, nondistended. Bowel sounds present. No organomegaly or mass.  EXTREMITIES: No pedal edema, cyanosis, or clubbing.  NEUROLOGIC: Cranial nerves II through XII are intact. Muscle strength 5/5 in all extremities. Sensation intact. Gait not checked.  PSYCHIATRIC: The patient is alert and oriented x 3.  SKIN: No obvious rash, lesion, or ulcer.    LABORATORY PANEL:   CBC  Recent Labs Lab 07/13/16 0311  WBC 9.7  HGB 14.0  HCT 39.4*  PLT 129*   ------------------------------------------------------------------------------------------------------------------  Chemistries   Recent Labs Lab 07/12/16 1331  NA 137  K 3.2*  CL 99*  CO2 23  GLUCOSE 241*  BUN 21*  CREATININE 2.13*  CALCIUM 8.8*  AST 115*  ALT 80*  ALKPHOS 137*  BILITOT 2.6*   ------------------------------------------------------------------------------------------------------------------  Cardiac Enzymes  Recent Labs Lab 07/13/16 1158  TROPONINI <0.03   ------------------------------------------------------------------------------------------------------------------  RADIOLOGY:  Ct Abdomen Pelvis Wo Contrast  Result Date: 07/12/2016 CLINICAL DATA:  Epigastric pain. [name]. Tachycardic. History of pancreatitis. EXAM: CT ABDOMEN AND PELVIS WITHOUT CONTRAST TECHNIQUE: Multidetector CT imaging of the abdomen and pelvis was performed following the standard protocol without IV contrast. COMPARISON:  07/12/2016 abdominal ultrasound.  CT of 07/03/2015. FINDINGS: Lower chest: Bibasilar atelectasis. Mild cardiomegaly, without pericardial or pleural effusion. Hepatobiliary: Moderate hepatic steatosis. Sparing adjacent the gallbladder. Hepatomegaly at 28 cm craniocaudal. Normal gallbladder, without biliary ductal dilatation. Pancreas: Subtle pancreatic and peripancreatic edema, most apparent adjacent the pancreatic head. No duct dilatation or focal peripancreatic fluid collection.  Spleen: Normal in size, without focal abnormality. Adrenals/Urinary Tract: Normal adrenal glands. Interpolar right renal lesion is too small to characterize but likely a cyst. Normal left kidney, without hydronephrosis or hydroureter. No bladder calculi. Stomach/Bowel: Normal stomach, without  wall thickening. Normal colon, appendix, and terminal ileum. Normal small bowel. Vascular/Lymphatic: Aortic and branch vessel atherosclerosis. 12 mm aortocaval node is upper normal in size and likely reactive. No pelvic sidewall adenopathy. Reproductive: Normal prostate. Other: No significant free fluid. Bilateral fat containing inguinal hernias. Musculoskeletal: No acute osseous abnormality. IMPRESSION: 1. Subtle findings which are suspicious for non complicated acute pancreatitis. 2. Hepatic steatosis and hepatomegaly. 3. Age advanced atherosclerosis. Electronically Signed   By: Jeronimo Greaves M.D.   On: 07/12/2016 17:34   Dg Chest 2 View  Result Date: 07/12/2016 CLINICAL DATA:  Chest pain.  [name]. EXAM: CHEST  2 VIEW COMPARISON:  11/18/2015 FINDINGS: The heart size and mediastinal contours are within normal limits. Both lungs are clear. The visualized skeletal structures are unremarkable. IMPRESSION: No active cardiopulmonary disease. Electronically Signed   By: Francene Boyers M.D.   On: 07/12/2016 13:59   US Abdomen Limited Ruq  Result Date: 07/12/2016 CLINICAL DATA:  Upper abdominal pain for 2 days EXAM: US ABDOMEN LIMITED - RIGHT UPPER QUADRANT COMPARISON:  CT scan 07/03/15 FINDINGS: Gallbladder: No gallstones or wall thickening visualized. No sonographic Murphy sign noted by sonographer. Common bile duct: Diameter: 4 mm in diameter within normal limits Liver: Multiple hypoechoic lesions are noted in right hepatic lobe of the liver. Under largest inferior aspect of the right hepatic lobe measures 2.6 x 2.7 cm. Mid aspect of the right lobe lesion measures 1.5 x 1.4 cm. Lateral inferior aspect of the right  hepatic lobe measures 1.6 x 1.3 cm. Further correlation with enhanced CT or MRI is recommended to exclude metastatic disease. IMPRESSION: 1. No gallstones are noted within gallbladder. Normal CBD. Hyperechoic lesions are noted within right hepatic further evaluation with enhanced CT or MRI is recommended to exclude metastatic disease. These results were called by telephone at the time of interpretation on 07/12/2016 at 4:37 pm to Dr. Jeri Lager, who verbally acknowledged these results. Electronically Signed   By: Natasha Mead M.D.   On: 07/12/2016 16:56    EKG:   Orders placed or performed during the hospital encounter of 07/12/16  . EKG 12-Lead  . EKG 12-Lead  . ED EKG within 10 minutes  . ED EKG within 10 minutes    ASSESSMENT AND PLAN:    Patient is a 46 year old white male presenting with abdominal pain noted to have acute pancreatitis  1. Acute pancreatitis with history of hypertriglyceridemia Triglycerides at 800 We'll keep him nothing by mouth with pain control prn  IV fluids  2. Fever could be vital syndrome-URI  influenza test- neg Chest x-ray negative Supportive treatment Follow-up blood and urine cultures Differing antibiotics at this time  3. Elevated LFTs could be due to fatty liver as well as his alcohol abuse will need follow-up liver function tests  4. Diabetes with elevated blood sugar I will continue levemir , on sliding scale insulin  5. Acute renal failure likely related to dehydration we'll give IV fluids  6. : lovenox for dvt proph    All the records are reviewed and case discussed with Care Management/Social Workerr. Management plans discussed with the patient, family and they are in agreement.  CODE STATUS: fc   TOTAL TIME TAKING CARE OF THIS PATIENT: .   POSSIBLE D/C IN 2 DAYS, DEPENDING ON CLINICAL CONDITION.  Note: This dictation was prepared with Dragon dictation along with smaller phrase technology. Any transcriptional errors  that result from this process are unintentional.   Ramonita Lab M.D on 07/13/2016 at  4:05 PM  Between 7am to 6pm - Pager - [phone] After 6pm go to [email or web address] - password [email or web address] [facility]  [place] [facility]  Office  [phone]  CC: Primary care physician; [name], MD

## 2016-07-13 NOTE — Progress Notes (Signed)
Patient troponin 0.03. MD notified. Acknowledged. No new orders received.

## 2016-07-13 NOTE — Plan of Care (Signed)
Problem: Bowel/Gastric: Goal: Will not experience complications related to bowel motility Outcome: Progressing Pt continues to progress toward goals.

## 2016-07-14 LAB — CBC
HCT: 35.7 % — ABNORMAL LOW (ref 40.0–52.0)
Hemoglobin: 12.7 g/dL — ABNORMAL LOW (ref 13.0–18.0)
MCH: 32.3 pg (ref 26.0–34.0)
MCHC: 35.6 g/dL (ref 32.0–36.0)
MCV: 90.6 fL (ref 80.0–100.0)
PLATELETS: 157 10*3/uL (ref 150–440)
RBC: 3.94 MIL/uL — ABNORMAL LOW (ref 4.40–5.90)
RDW: 14.4 % (ref 11.5–14.5)
WBC: 11.3 10*3/uL — ABNORMAL HIGH (ref 3.8–10.6)

## 2016-07-14 LAB — GLUCOSE, CAPILLARY
GLUCOSE-CAPILLARY: 144 mg/dL — AB (ref 65–99)
Glucose-Capillary: 83 mg/dL (ref 65–99)
Glucose-Capillary: 115 mg/dL — ABNORMAL HIGH (ref 65–99)
Glucose-Capillary: 134 mg/dL — ABNORMAL HIGH (ref 65–99)

## 2016-07-14 LAB — COMPREHENSIVE METABOLIC PANEL
ALK PHOS: 195 U/L — AB (ref 38–126)
ALT: 96 U/L — AB (ref 17–63)
AST: 111 U/L — AB (ref 15–41)
Albumin: 3.1 g/dL — ABNORMAL LOW (ref 3.5–5.0)
Anion gap: 11 (ref 5–15)
BUN: 17 mg/dL (ref 6–20)
CALCIUM: 8.7 mg/dL — AB (ref 8.9–10.3)
CHLORIDE: 109 mmol/L (ref 101–111)
CO2: 23 mmol/L (ref 22–32)
CREATININE: 0.96 mg/dL (ref 0.61–1.24)
GFR calc Af Amer: >60 mL/min (ref >60)
Glucose, Bld: 93 mg/dL (ref 65–99)
Potassium: 4.5 mmol/L (ref 3.5–5.1)
Sodium: 143 mmol/L (ref 135–145)
Total Bilirubin: 3.9 mg/dL — ABNORMAL HIGH (ref 0.3–1.2)
Total Protein: 7.6 g/dL (ref 6.5–8.1)

## 2016-07-14 LAB — LIPASE, BLOOD: LIPASE: 16 U/L (ref 11–51)

## 2016-07-14 MED ORDER — MAGNESIUM HYDROXIDE 400 MG/5ML PO SUSP
30.0000 mL | Freq: Two times a day (BID) | ORAL | Status: DC | PRN
  Administered 2016-07-14: 30 mL via ORAL
  Filled 2016-07-14: qty 30

## 2016-07-14 NOTE — Progress Notes (Addendum)
[facility] - [place] at [facility]   PATIENT NAME: Donald Hart    MR#:  [id number]  DATE OF BIRTH:  1970/12/11  SUBJECTIVE:  CHIEF COMPLAINT:  Patient was afebrile last night Complaining of abdominal pain better than yesterday. Denies any nausea Reports cough and nasal stuffiness, denies any diarrhea  REVIEW OF SYSTEMS:  CONSTITUTIONAL: No fever, fatigue or weakness.  [name], NOSE, AND THROAT:Reporting nasal stuffiness and cough No tinnitus or ear pain.  RESPIRATORY:   reportscough,  deniesshortness of breath, [name], orthopnea, edema.  GASTROINTESTINAL: No nausea, vomiting, diarrhea or reporting epigastric  abdominal pain.  GENITOURINARY: No dysuria, [name], nocturia,  [name], [name], numbness, weakness.  PSYCHIATRY: No anxiety or depression.   DRUG ALLERGIES:  No Known Allergies  VITALS:  Blood pressure (!) 168/92, pulse (!) 107, temperature 98.6 F (37 C), temperature source Oral, resp. rate 18, height 6\' 2"  (1.88 m), weight 113.4 kg (250 lb), SpO2 98 %.  PHYSICAL EXAMINATION:  GENERAL:  46 y.o.-year-old patient lying in the bed with no acute distress.  EYES: Pupils equal, round, [name], normocephalic. Oropharyn clear.  Needs are congested with hypertrophied turbinates, postnasal drip is present NECK:  Supple, [name], no tenderness.  LUNGS: Moderate  breath sounds bilaterally, no wheezing, rales,[name], [name], rubs, [name], left upper quadrant  tenderness is present no rebound tenderness, nondistended. Bowel sounds present. No organomegaly or mass.  EXTREMITIES: No pedal edema, cyanosis, or clubbing.  NEUROLOGIC: Cranial nerves II through XII are intact. Muscle strength 5/5 in all extremities. Sensation intact. Gait not checked.  PSYCHIATRIC: The patient is alert and oriented x 3.  SKIN: No obvious rash, lesion, or ulcer.    LABORATORY PANEL:   CBC  Recent Labs Lab 07/14/16 0623  WBC 11.3*  HGB 12.7*  HCT 35.7*  PLT 157   ------------------------------------------------------------------------------------------------------------------  Chemistries   Recent Labs Lab 07/14/16 0623  NA 143  K 4.5  CL 109  CO2 23  GLUCOSE 93  BUN 17  CREATININE 0.96  CALCIUM 8.7*  AST 111*  ALT 96*  ALKPHOS 195*  BILITOT 3.9*   ------------------------------------------------------------------------------------------------------------------  Cardiac Enzymes  Recent Labs Lab 07/13/16 1158  TROPONINI <0.03   ------------------------------------------------------------------------------------------------------------------  RADIOLOGY:  Ct Abdomen Pelvis Wo Contrast  Result Date: 07/12/2016 CLINICAL DATA:  Epigastric pain. [name]. Tachycardic. History of pancreatitis. EXAM: CT ABDOMEN AND PELVIS WITHOUT CONTRAST TECHNIQUE: Multidetector CT imaging of the abdomen and pelvis was performed following the standard protocol without IV contrast. COMPARISON:  07/12/2016 abdominal ultrasound.  CT of 07/03/2015. FINDINGS: Lower chest: Bibasilar atelectasis. Mild cardiomegaly, without pericardial or pleural effusion. Hepatobiliary: Moderate hepatic steatosis. Sparing adjacent the gallbladder. Hepatomegaly at 28 cm craniocaudal. Normal gallbladder, without biliary ductal dilatation. Pancreas: Subtle pancreatic and peripancreatic edema, most apparent adjacent the pancreatic head. No duct dilatation or focal peripancreatic fluid  collection. Spleen: Normal in size, without focal abnormality. Adrenals/Urinary Tract: Normal adrenal glands. Interpolar right renal lesion is too small to characterize but likely a cyst. Normal left kidney, without hydronephrosis or hydroureter. No bladder calculi. Stomach/Bowel:  Normal stomach, without wall thickening. Normal colon, appendix, and terminal ileum. Normal small bowel. Vascular/Lymphatic: Aortic and branch vessel atherosclerosis. 12 mm aortocaval node is upper normal in size and likely reactive. No pelvic sidewall adenopathy. Reproductive: Normal prostate. Other: No significant free fluid. Bilateral fat containing inguinal hernias. Musculoskeletal: No acute osseous abnormality. IMPRESSION: 1. Subtle findings which are suspicious for non complicated acute pancreatitis. 2. Hepatic steatosis and hepatomegaly. 3. Age advanced atherosclerosis. Electronically Signed   By: Jeronimo Greaves M.D.   On: 07/12/2016 17:34   Dg Chest 2 View  Result Date: 07/12/2016 CLINICAL DATA:  Chest pain.  [name]. EXAM: CHEST  2 VIEW COMPARISON:  11/18/2015 FINDINGS: The heart size and mediastinal contours are within normal limits. Both lungs are clear. The visualized skeletal structures are unremarkable. IMPRESSION: No active cardiopulmonary disease. Electronically Signed   By: Francene Boyers M.D.   On: 07/12/2016 13:59   US Abdomen Limited Ruq  Result Date: 07/12/2016 CLINICAL DATA:  Upper abdominal pain for 2 days EXAM: US ABDOMEN LIMITED - RIGHT UPPER QUADRANT COMPARISON:  CT scan 07/03/15 FINDINGS: Gallbladder: No gallstones or wall thickening visualized. No sonographic Murphy sign noted by sonographer. Common bile duct: Diameter: 4 mm in diameter within normal limits Liver: Multiple hypoechoic lesions are noted in right hepatic lobe of the liver. Under largest inferior aspect of the right hepatic lobe measures 2.6 x 2.7 cm. Mid aspect of the right lobe lesion measures 1.5 x 1.4 cm. Lateral inferior aspect  of the right hepatic lobe measures 1.6 x 1.3 cm. Further correlation with enhanced CT or MRI is recommended to exclude metastatic disease. IMPRESSION: 1. No gallstones are noted within gallbladder. Normal CBD. Hyperechoic lesions are noted within right hepatic further evaluation with enhanced CT or MRI is recommended to exclude metastatic disease. These results were called by telephone at the time of interpretation on 07/12/2016 at 4:37 pm to Dr. Jeri Lager, who verbally acknowledged these results. Electronically Signed   By: Natasha Mead M.D.   On: 07/12/2016 16:56    EKG:   Orders placed or performed during the hospital encounter of 07/12/16  . EKG 12-Lead  . EKG 12-Lead  . ED EKG within 10 minutes  . ED EKG within 10 minutes    ASSESSMENT AND PLAN:    Patient is a 46 year old white male presenting with abdominal pain noted to have acute pancreatitis  1. Acute pancreatitis with history of hypertriglyceridemia Triglycerides at 800 Supportive treatment, pain control prn  IV fluids Start clear liquid diet as tolerated Ambulate patient  2. Fever could be vital syndrome-URI  influenza test- neg Chest x-ray negative Supportive treatment Follow-up blood and urine cultures Differ antibiotics at this time  3. Elevated LFTs could be due to fatty liver as well as his alcohol abuse will need follow-up liver function tests  4. Diabetes with elevated blood sugar I will continue levemir , on sliding scale insulin  5. Acute renal failure likely related to dehydration we'll give IV fluids  6. Constipation laxatives as needed  . : lovenox for dvt proph  Will give him a work excuse note at the time of discharge  All the records are reviewed and case discussed with Care Management/Social Workerr. Management plans discussed with the patient, family and they are in agreement.  CODE STATUS: fc   TOTAL TIME TAKING CARE OF THIS PATIENT: .   POSSIBLE D/C IN 1 DAYS, DEPENDING ON  CLINICAL CONDITION.  Note: This dictation was prepared with Dragon dictation  along with smaller phrase technology. Any transcriptional errors that result from this process are unintentional.   Ramonita LabGouru, Moraima Burd M.D on 07/14/2016 at 11:53 AM  Between 7am to 6pm - Pager - [phone] After 6pm go to [email or web address] - password [email or web address] [facility]  [place] [facility]  Office  [phone]  CC: Primary care physician; Dione Housekeeperlmedo, Mario Ernesto, MD

## 2016-07-15 LAB — GLUCOSE, CAPILLARY
GLUCOSE-CAPILLARY: 104 mg/dL — AB (ref 65–99)
GLUCOSE-CAPILLARY: 124 mg/dL — AB (ref 65–99)
Glucose-Capillary: 106 mg/dL — ABNORMAL HIGH (ref 65–99)
Glucose-Capillary: 106 mg/dL — ABNORMAL HIGH (ref 65–99)
Glucose-Capillary: 153 mg/dL — ABNORMAL HIGH (ref 65–99)

## 2016-07-15 LAB — CBC
HCT: 35.7 % — ABNORMAL LOW (ref 40.0–52.0)
Hemoglobin: 12.7 g/dL — ABNORMAL LOW (ref 13.0–18.0)
MCH: 32.1 pg (ref 26.0–34.0)
MCHC: 35.4 g/dL (ref 32.0–36.0)
MCV: 90.6 fL (ref 80.0–100.0)
PLATELETS: 166 10*3/uL (ref 150–440)
RBC: 3.95 MIL/uL — ABNORMAL LOW (ref 4.40–5.90)
RDW: 14.5 % (ref 11.5–14.5)
WBC: 8.6 10*3/uL (ref 3.8–10.6)

## 2016-07-15 LAB — URINE CULTURE
Culture: NO GROWTH
Special Requests: NORMAL

## 2016-07-15 LAB — MAGNESIUM: Magnesium: 1.5 mg/dL — ABNORMAL LOW (ref 1.7–2.4)

## 2016-07-15 MED ORDER — GUAIFENESIN-DM 100-10 MG/5ML PO SYRP: 10.0000 mL | ORAL_SOLUTION | Freq: Four times a day (QID) | ORAL | 0 refills | Status: DC | PRN

## 2016-07-15 MED ORDER — INSULIN ASPART 100 UNIT/ML ~~LOC~~ SOLN: 0.0000 [IU] | Freq: Every day | SUBCUTANEOUS | Status: DC

## 2016-07-15 MED ORDER — MAGNESIUM SULFATE 2 GM/50ML IV SOLN
2.0000 g | Freq: Once | INTRAVENOUS | Status: DC
  Filled 2016-07-15: qty 50

## 2016-07-15 MED ORDER — INSULIN ASPART 100 UNIT/ML ~~LOC~~ SOLN
0.0000 [IU] | Freq: Three times a day (TID) | SUBCUTANEOUS | Status: DC
  Administered 2016-07-15: 3 [IU] via SUBCUTANEOUS
  Filled 2016-07-15: qty 3

## 2016-07-15 MED ORDER — HYDROCODONE-ACETAMINOPHEN 5-325 MG PO TABS: 1.0000 | ORAL_TABLET | ORAL | 0 refills | Status: DC | PRN

## 2016-07-15 MED ORDER — MAGNESIUM SULFATE 2 GM/50ML IV SOLN
2.0000 g | Freq: Once | INTRAVENOUS | Status: AC
  Administered 2016-07-15: 2 g via INTRAVENOUS
  Filled 2016-07-15: qty 50

## 2016-07-15 MED ORDER — MENTHOL 3 MG MT LOZG: 1.0000 | LOZENGE | OROMUCOSAL | 12 refills | Status: DC | PRN

## 2016-07-15 MED ORDER — MENTHOL 3 MG MT LOZG
1.0000 | LOZENGE | OROMUCOSAL | Status: DC | PRN
  Filled 2016-07-15: qty 9

## 2016-07-15 MED ORDER — PANTOPRAZOLE SODIUM 40 MG PO TBEC
40.0000 mg | DELAYED_RELEASE_TABLET | Freq: Every day | ORAL | Status: DC
  Administered 2016-07-15: 40 mg via ORAL
  Filled 2016-07-15: qty 1

## 2016-07-15 NOTE — Discharge Instructions (Signed)
Follow-up with primary care physician in a week ° °

## 2016-07-15 NOTE — Progress Notes (Signed)
[facility]         [place]   07/15/2016  Patient: Donald Hart   Date of Birth:  Aug 19, 1970  Date of admission:  07/12/2016  Date of Discharge  07/15/2016    To Whom it May Concern:   Donald Hart  may return to work on 07/22/16.  PHYSICAL ACTIVITY:  Full  If you have any questions or concerns, please don't hesitate to call.  Sincerely,   Ramonita LabGouru, Shelva Hetzer M.D Pager [phone] Office : [phone]   .

## 2016-07-15 NOTE — Discharge Summary (Signed)
[facility] [place] [facility]   PATIENT NAME: Donald Hart    MR#:  [id number]  DATE OF BIRTH:  [birth year]  DATE OF ADMISSION:  07/12/2016 ADMITTING PHYSICIAN: Auburn BilberryShreyang Patel, MD  DATE OF DISCHARGE: 07/15/16 PRIMARY CARE PHYSICIAN: Dione Housekeeperlmedo, Mario Ernesto, MD    ADMISSION DIAGNOSIS:  Dehydration [E86.0] Epigastric pain [R10.13] Acute kidney injury (HCC) [N17.9] Nonspecific chest pain [R07.9] Acute pancreatitis, unspecified complication status, unspecified pancreatitis type [K85.90]  DISCHARGE DIAGNOSIS:  Active Problems:   Acute pancreatitis  Hypertriglyceridemia  SECONDARY DIAGNOSIS:   Past Medical History:  Diagnosis Date  . Acute pancreatitis 07/2015   [facility]  . Alcohol abuse   . Chronic back pain   . Family history of diabetes mellitus    sister  . Family history of heart attack    Father.   . Family history of stomach cancer   . Gout   . Hypertension   . Hypertriglyceridemia   . Type 2 diabetes mellitus (HCC) 07/2015    HOSPITAL COURSE:  HISTORY OF PRESENT ILLNESS: Donald HoseFranklin Matsumura  is a 46 y.o. male with a known history of Alcohol abuse in the past, pancreatitis due to hypertriglyceridemia,  essential hypertension, hyperlipemia who presents with abdominal pain on going for past few days. He has also been having some nausea and vomiting. He also complains of some substernal chest pain as well as intermittent shortness of breath. Patient in the ER was noted to have a normal lipase however his creatinine was elevated. He was also noticed to have CT scan that showed pancreatitis. Therefore we are asked to admit him.  Hospital course  Patient is a 46 year old white male presenting with abdominal pain noted to have acute pancreatitis  1. Acute pancreatitis with history of hypertriglyceridemia Triglycerides at 800, continue statin and gemfibrozil. PCP to monitor triglyceride level and LFTs Supportive treatment, pain  control prn  IV fluids given Started clear liquid diet AND advanced as tolerated ,pt tolerated well   2. Fever could be vital syndrome-URI  influenza test- neg Chest x-ray negative Supportive treatment Follow-up blood and urine cultures are negative  3. Elevated LFTs could be due to fatty liver as well as his alcohol abuse will need follow-up liver function tests  4. Diabetes with elevated blood sugar I will continue levemir , on sliding scale insulin  5. Acute renal failure likely related to dehydration resolved with IV fluids 6. Constipation laxatives as needed  7. Hypomagnesemia repleted magnesium   . : lovenox for dvt proph   DISCHARGE CONDITIONS:   FAIR  CONSULTS OBTAINED:     PROCEDURES NONE  DRUG ALLERGIES:  No Known Allergies  DISCHARGE MEDICATIONS:   Current Discharge Medication List    START taking these medications   Details  guaiFENesin-dextromethorphan (ROBITUSSIN DM) 100-10 MG/5ML syrup Take 10 mLs by mouth every 6 (six) hours as needed for cough. Qty: 118 mL, Refills: 0    HYDROcodone-acetaminophen (NORCO/VICODIN) 5-325 MG tablet Take 1-2 tablets by mouth every 4 (four) hours as needed for moderate pain. Qty: 30 tablet, Refills: 0    menthol-cetylpyridinium (CEPACOL) 3 MG lozenge Take 1 lozenge (3 mg total) by mouth as needed for sore throat. Qty: 100 tablet, Refills: 12      CONTINUE these medications which have NOT CHANGED   Details  allopurinol (ZYLOPRIM) 100 MG tablet Take 2 tablets by mouth daily. Refills: 0    amLODipine (NORVASC) 10 MG tablet Take 1 tablet by mouth daily. Refills: 0  atorvastatin (LIPITOR) 40 MG tablet Take 40 mg by mouth daily. Reported on 07/21/2015    carvedilol (COREG) 6.25 MG tablet Take 1 tablet by mouth 2 (two) times daily. Refills: 11    gemfibrozil (LOPID) 600 MG tablet Take 1 tablet (600 mg total) by mouth 2 (two) times daily before a meal. Qty: 60 tablet, Refills: 1    ibuprofen  (ADVIL,MOTRIN) 200 MG tablet Take 800 mg by mouth every 8 (eight) hours as needed.     insulin NPH Human (HUMULIN N,NOVOLIN N) 100 UNIT/ML injection Inject 14 Units into the skin at bedtime. Reported on 07/21/2015    insulin regular (NOVOLIN R,HUMULIN R) 100 units/mL injection Inject 14 Units into the skin 3 (three) times daily before meals. Plus sliding scale    pregabalin (LYRICA) 100 MG capsule Take 100 mg by mouth 3 (three) times daily.    albuterol (PROVENTIL HFA;VENTOLIN HFA) 108 (90 Base) MCG/ACT inhaler Inhale 2 puffs into the lungs every 6 (six) hours as needed for wheezing or shortness of breath. Qty: 1 Inhaler, Refills: 2    benzonatate (TESSALON) 200 MG capsule Take 1 capsule (200 mg total) by mouth 3 (three) times daily as needed for cough. Qty: 20 capsule, Refills: 0      STOP taking these medications     levofloxacin (LEVAQUIN) 750 MG tablet          DISCHARGE INSTRUCTIONS:   Follow-up with primary care physician in a week  DIET:  Cardiac diet, diabetic   DISCHARGE CONDITION:  Stable  ACTIVITY:  Activity as tolerated  OXYGEN:  Home Oxygen: No.   Oxygen Delivery: room air  DISCHARGE LOCATION:  home   If you experience worsening of your admission symptoms, develop shortness of breath, life threatening emergency, suicidal or homicidal thoughts you must seek medical attention immediately by calling 911 or calling your MD immediately  if symptoms less severe.  You Must read complete instructions/literature along with all the possible adverse reactions/side effects for all the Medicines you take and that have been prescribed to you. Take any new Medicines after you have completely understood and accpet all the possible adverse reactions/side effects.   Please note  You were cared for by a hospitalist during your hospital stay. If you have any questions about your discharge medications or the care you received while you were in the hospital after you are  discharged, you can call the unit and asked to speak with the hospitalist on call if the hospitalist that took care of you is not available. Once you are discharged, your primary care physician will handle any further medical issues. Please note that NO REFILLS for any discharge medications will be authorized once you are discharged, as it is imperative that you return to your primary care physician (or establish a relationship with a primary care physician if you do not have one) for your aftercare needs so that they can reassess your need for medications and monitor your lab values.     Today  Chief Complaint  Patient presents with  . Chest Pain   Patient's abdominal pain significantly improved. Tolerating diet fine. Wants to go home  ROS:  CONSTITUTIONAL: Denies fevers, chills. Denies any fatigue, weakness.  EYES: Denies blurry vision, double vision, eye pain. EARS, NOSE, THROAT: Denies tinnitus, ear pain, hearing loss. RESPIRATORY: Denies cough, wheeze, shortness of breath.  CARDIOVASCULAR: Denies chest pain, palpitations, edema.  GASTROINTESTINAL: Denies nausea, vomiting, diarrhea, abdominal pain. Denies bright red blood per rectum. GENITOURINARY: Denies  dysuria, hematuria. ENDOCRINE: Denies nocturia or thyroid problems. HEMATOLOGIC AND LYMPHATIC: Denies easy bruising or bleeding. SKIN: Denies rash or lesion. MUSCULOSKELETAL: Denies pain in neck, back, shoulder, knees, hips or arthritic symptoms.  NEUROLOGIC: Denies paralysis, paresthesias.  PSYCHIATRIC: Denies anxiety or depressive symptoms.   VITAL SIGNS:  Blood pressure (!) 149/77, pulse (!) 102, temperature 99.7 F (37.6 C), temperature source Oral, resp. rate 16, height 6\' 2"  (1.88 m), weight 113.4 kg (250 lb), SpO2 95 %.  I/O:    Intake/Output Summary (Last 24 hours) at 07/15/16 1656 Last data filed at 07/15/16 1513  Gross per 24 hour  Intake          5997.08 ml  Output             2100 ml  Net          3897.08 ml     PHYSICAL EXAMINATION:  GENERAL:  46 y.o.-year-old patient lying in the bed with no acute distress.  EYES: Pupils equal, round, [name], normocephalic. Oropharynx and nasopharynx clear.  NECK:  Supple, [name], [name], no wheezing, rales,[name], [name], rubs, [name], non-tender, non-distended. Bowel sounds present. No organomegaly or mass.  EXTREMITIES: No pedal edema, cyanosis, or clubbing.  NEUROLOGIC: Cranial nerves II through XII are intact. Muscle strength 5/5 in all extremities. Sensation intact. Gait not checked.  PSYCHIATRIC: The patient is alert and oriented x 3.  SKIN: No obvious rash, lesion, or ulcer.   DATA REVIEW:   CBC  Recent Labs Lab 07/15/16 0505  WBC 8.6  HGB 12.7*  HCT 35.7*  PLT 166    Chemistries   Recent Labs Lab 07/14/16 0623 07/15/16 0505  NA 143  --   K 4.5  --   CL 109  --   CO2 23  --   GLUCOSE 93  --   BUN 17  --   CREATININE 0.96  --   CALCIUM 8.7*  --   MG  --  1.5*  AST 111*  --   ALT 96*  --   ALKPHOS 195*  --   BILITOT 3.9*  --     Cardiac Enzymes  Recent Labs Lab 07/13/16 1158  TROPONINI <0.03    Microbiology Results  Results for orders placed or performed during the hospital encounter of 07/12/16  CULTURE, BLOOD (ROUTINE X 2) w Reflex to ID Panel     Status: None (Preliminary result)   Collection Time: 07/13/16 11:58 AM  Result Value Ref Range Status   Specimen Description BLOOD LEFT ASSIST CONTROL  Final   Special Requests BOTTLES DRAWN AEROBIC AND ANAEROBIC ANA8ML AER12ML  Final   Culture NO GROWTH 2 DAYS  Final   Report Status PENDING  Incomplete  CULTURE, BLOOD (ROUTINE X 2) w Reflex to ID Panel      Status: None (Preliminary result)   Collection Time: 07/13/16 12:04 PM  Result Value Ref Range Status   Specimen Description BLOOD LEFT HAND  Final   Special Requests BOTTLES DRAWN AEROBIC AND ANAEROBIC ANA7ML AER7ML  Final   Culture NO GROWTH 2 DAYS  Final   Report Status PENDING  Incomplete  Urine culture     Status: None   Collection Time: 07/13/16 12:20 PM  Result Value  Ref Range Status   Specimen Description URINE, CLEAN CATCH  Final   Special Requests Normal  Final   Culture NO GROWTH Performed at [facility]   Final   Report Status 07/15/2016 FINAL  Final    RADIOLOGY:  Ct Abdomen Pelvis Wo Contrast  Result Date: 07/12/2016 CLINICAL DATA:  Epigastric pain. [name]. Tachycardic. History of pancreatitis. EXAM: CT ABDOMEN AND PELVIS WITHOUT CONTRAST TECHNIQUE: Multidetector CT imaging of the abdomen and pelvis was performed following the standard protocol without IV contrast. COMPARISON:  07/12/2016 abdominal ultrasound.  CT of 07/03/2015. FINDINGS: Lower chest: Bibasilar atelectasis. Mild cardiomegaly, without pericardial or pleural effusion. Hepatobiliary: Moderate hepatic steatosis. Sparing adjacent the gallbladder. Hepatomegaly at 28 cm craniocaudal. Normal gallbladder, without biliary ductal dilatation. Pancreas: Subtle pancreatic and peripancreatic edema, most apparent adjacent the pancreatic head. No duct dilatation or focal peripancreatic fluid collection. Spleen: Normal in size, without focal abnormality. Adrenals/Urinary Tract: Normal adrenal glands. Interpolar right renal lesion is too small to characterize but likely a cyst. Normal left kidney, without hydronephrosis or hydroureter. No bladder calculi. Stomach/Bowel: Normal stomach, without wall thickening. Normal colon, appendix, and terminal ileum. Normal small bowel. Vascular/Lymphatic: Aortic and branch vessel atherosclerosis. 12 mm aortocaval node is upper normal in size and likely reactive. No pelvic  sidewall adenopathy. Reproductive: Normal prostate. Other: No significant free fluid. Bilateral fat containing inguinal hernias. Musculoskeletal: No acute osseous abnormality. IMPRESSION: 1. Subtle findings which are suspicious for non complicated acute pancreatitis. 2. Hepatic steatosis and hepatomegaly. 3. Age advanced atherosclerosis. Electronically Signed   By: Jeronimo Greaves M.D.   On: 07/12/2016 17:34   Dg Chest 2 View  Result Date: 07/12/2016 CLINICAL DATA:  Chest pain.  [name]. EXAM: CHEST  2 VIEW COMPARISON:  11/18/2015 FINDINGS: The heart size and mediastinal contours are within normal limits. Both lungs are clear. The visualized skeletal structures are unremarkable. IMPRESSION: No active cardiopulmonary disease. Electronically Signed   By: Francene Boyers M.D.   On: 07/12/2016 13:59   US Abdomen Limited Ruq  Result Date: 07/12/2016 CLINICAL DATA:  Upper abdominal pain for 2 days EXAM: US ABDOMEN LIMITED - RIGHT UPPER QUADRANT COMPARISON:  CT scan 07/03/15 FINDINGS: Gallbladder: No gallstones or wall thickening visualized. No sonographic Murphy sign noted by sonographer. Common bile duct: Diameter: 4 mm in diameter within normal limits Liver: Multiple hypoechoic lesions are noted in right hepatic lobe of the liver. Under largest inferior aspect of the right hepatic lobe measures 2.6 x 2.7 cm. Mid aspect of the right lobe lesion measures 1.5 x 1.4 cm. Lateral inferior aspect of the right hepatic lobe measures 1.6 x 1.3 cm. Further correlation with enhanced CT or MRI is recommended to exclude metastatic disease. IMPRESSION: 1. No gallstones are noted within gallbladder. Normal CBD. Hyperechoic lesions are noted within right hepatic further evaluation with enhanced CT or MRI is recommended to exclude metastatic disease. These results were called by telephone at the time of interpretation on 07/12/2016 at 4:37 pm to Dr. Jeri Lager, who verbally acknowledged these results. Electronically Signed    By: Natasha Mead M.D.   On: 07/12/2016 16:56    EKG:   Orders placed or performed during the hospital encounter of 07/12/16  . EKG 12-Lead  . EKG 12-Lead  . ED EKG within 10 minutes  . ED EKG within 10 minutes      Management plans discussed with the patient, family and they are in agreement.  CODE STATUS:     Code Status  Orders        Start     Ordered   07/12/16 1955  Full code  Continuous     07/12/16 1954    Code Status History    Date Active Date Inactive Code Status Order ID Comments User Context   07/03/2015  2:31 PM 07/09/2015  5:55 PM Full Code [id number]  Alford Highland, MD ED   01/19/2015  6:50 PM 01/21/2015  1:37 PM Full Code [id number]  Shaune Pollack, MD Inpatient      TOTAL TIME TAKING CARE OF THIS PATIENT: 45 minutes.   Note: This dictation was prepared with Dragon dictation along with smaller phrase technology. Any transcriptional errors that result from this process are unintentional.   @[facility] @  on 07/15/2016 at 4:56 PM  Between 7am to 6pm - Pager - [phone]  After 6pm go to [email or web address] - password [email or web address] [facility]  [place] [facility]  Office  [phone]  CC: Primary care physician; [name], MD

## 2016-07-15 NOTE — Progress Notes (Signed)
Discharge instructions and prescriptions given with verbalized understanding.  IV removed per policy and procedure. Patient taken to visitors entrance via wheelchair by nurse to be taken home by wife in personal vehicle.

## 2016-07-18 LAB — CULTURE, BLOOD (ROUTINE X 2)
CULTURE: NO GROWTH
CULTURE: NO GROWTH

## 2016-07-25 DIAGNOSIS — R200 Anesthesia of skin: Secondary | ICD-10-CM | POA: Insufficient documentation

## 2016-08-07 ENCOUNTER — Other Ambulatory Visit: Payer: Self-pay | Admitting: Gastroenterology

## 2016-08-07 DIAGNOSIS — R945 Abnormal results of liver function studies: Principal | ICD-10-CM

## 2016-08-07 DIAGNOSIS — K769 Liver disease, unspecified: Secondary | ICD-10-CM

## 2016-08-07 DIAGNOSIS — R7989 Other specified abnormal findings of blood chemistry: Secondary | ICD-10-CM

## 2016-08-19 ENCOUNTER — Other Ambulatory Visit: Payer: Self-pay | Admitting: Gastroenterology

## 2016-08-19 DIAGNOSIS — R945 Abnormal results of liver function studies: Principal | ICD-10-CM

## 2016-08-19 DIAGNOSIS — R7989 Other specified abnormal findings of blood chemistry: Secondary | ICD-10-CM

## 2016-08-20 ENCOUNTER — Ambulatory Visit
Admission: RE | Admit: 2016-08-20 | Discharge: 2016-08-20 | Disposition: A | Discharge status: Home / Self Care | Payer: BLUE CROSS/BLUE SHIELD | Source: Ambulatory Visit | Attending: Gastroenterology | Admitting: Gastroenterology

## 2016-08-20 DIAGNOSIS — K7689 Other specified diseases of liver: Secondary | ICD-10-CM | POA: Diagnosis not present

## 2016-08-20 DIAGNOSIS — K760 Fatty (change of) liver, not elsewhere classified: Secondary | ICD-10-CM | POA: Insufficient documentation

## 2016-08-20 DIAGNOSIS — R7989 Other specified abnormal findings of blood chemistry: Secondary | ICD-10-CM | POA: Diagnosis not present

## 2016-08-20 DIAGNOSIS — K769 Liver disease, unspecified: Secondary | ICD-10-CM

## 2016-08-20 DIAGNOSIS — R945 Abnormal results of liver function studies: Secondary | ICD-10-CM

## 2016-08-20 MED ORDER — GADOBENATE DIMEGLUMINE 529 MG/ML IV SOLN
20.0000 mL | Freq: Once | INTRAVENOUS | Status: AC | PRN
  Administered 2016-08-20: 20 mL via INTRAVENOUS

## 2016-09-04 ENCOUNTER — Emergency Department
Admission: EM | Admit: 2016-09-04 | Discharge: 2016-09-04 | Disposition: A | Discharge status: Home / Self Care | Payer: BLUE CROSS/BLUE SHIELD | Attending: Emergency Medicine | Admitting: Emergency Medicine

## 2016-09-04 ENCOUNTER — Ambulatory Visit
Admission: EM | Admit: 2016-09-04 | Discharge: 2016-09-04 | Disposition: A | Discharge status: Other Acute Inpatient Hospital | Payer: BLUE CROSS/BLUE SHIELD | Attending: Family Medicine | Admitting: Family Medicine

## 2016-09-04 ENCOUNTER — Encounter: Payer: Self-pay | Admitting: Emergency Medicine

## 2016-09-04 ENCOUNTER — Encounter: Payer: Self-pay | Admitting: *Deleted

## 2016-09-04 DIAGNOSIS — Z79899 Other long term (current) drug therapy: Secondary | ICD-10-CM | POA: Diagnosis not present

## 2016-09-04 DIAGNOSIS — Z794 Long term (current) use of insulin: Secondary | ICD-10-CM | POA: Diagnosis not present

## 2016-09-04 DIAGNOSIS — F17210 Nicotine dependence, cigarettes, uncomplicated: Secondary | ICD-10-CM | POA: Insufficient documentation

## 2016-09-04 DIAGNOSIS — K047 Periapical abscess without sinus: Secondary | ICD-10-CM

## 2016-09-04 DIAGNOSIS — I10 Essential (primary) hypertension: Secondary | ICD-10-CM | POA: Insufficient documentation

## 2016-09-04 DIAGNOSIS — E119 Type 2 diabetes mellitus without complications: Secondary | ICD-10-CM | POA: Insufficient documentation

## 2016-09-04 DIAGNOSIS — R220 Localized swelling, mass and lump, head: Secondary | ICD-10-CM

## 2016-09-04 DIAGNOSIS — K0889 Other specified disorders of teeth and supporting structures: Secondary | ICD-10-CM | POA: Diagnosis not present

## 2016-09-04 LAB — CBC WITH DIFFERENTIAL/PLATELET
BASOS ABS: 0.1 10*3/uL (ref 0–0.1)
BASOS PCT: 1 %
Eosinophils Absolute: 0.4 10*3/uL (ref 0–0.7)
Eosinophils Relative: 3 %
HEMATOCRIT: 42.4 % (ref 40.0–52.0)
HEMOGLOBIN: 15 g/dL (ref 13.0–18.0)
LYMPHS PCT: 29 %
Lymphs Abs: 3.2 10*3/uL (ref 1.0–3.6)
MCH: 32.1 pg (ref 26.0–34.0)
MCHC: 35.5 g/dL (ref 32.0–36.0)
MCV: 90.5 fL (ref 80.0–100.0)
MONO ABS: 1 10*3/uL (ref 0.2–1.0)
Monocytes Relative: 9 %
NEUTROS ABS: 6.2 10*3/uL (ref 1.4–6.5)
NEUTROS PCT: 58 %
Platelets: 252 10*3/uL (ref 150–440)
RBC: 4.68 MIL/uL (ref 4.40–5.90)
RDW: 13.3 % (ref 11.5–14.5)
WBC: 10.9 10*3/uL — AB (ref 3.8–10.6)

## 2016-09-04 LAB — COMPREHENSIVE METABOLIC PANEL
ALBUMIN: 4.2 g/dL (ref 3.5–5.0)
ALK PHOS: 93 U/L (ref 38–126)
ALT: 30 U/L (ref 17–63)
AST: 24 U/L (ref 15–41)
Anion gap: 10 (ref 5–15)
BILIRUBIN TOTAL: 0.9 mg/dL (ref 0.3–1.2)
BUN: 17 mg/dL (ref 6–20)
CO2: 25 mmol/L (ref 22–32)
CREATININE: 0.84 mg/dL (ref 0.61–1.24)
Calcium: 9.4 mg/dL (ref 8.9–10.3)
Chloride: 100 mmol/L — ABNORMAL LOW (ref 101–111)
GFR calc Af Amer: >60 mL/min (ref >60)
GLUCOSE: 279 mg/dL — AB (ref 65–99)
POTASSIUM: 3.6 mmol/L (ref 3.5–5.1)
Sodium: 135 mmol/L (ref 135–145)
TOTAL PROTEIN: 8.2 g/dL — AB (ref 6.5–8.1)

## 2016-09-04 MED ORDER — SODIUM CHLORIDE 0.9 % IV SOLN
Freq: Once | INTRAVENOUS | Status: AC
  Administered 2016-09-04: 18:00:00 via INTRAVENOUS
  Filled 2016-09-04: qty 1000

## 2016-09-04 MED ORDER — METHYLPREDNISOLONE 4 MG PO TBPK: ORAL_TABLET | ORAL | 0 refills | Status: DC

## 2016-09-04 MED ORDER — HYDROMORPHONE HCL 1 MG/ML IJ SOLN
0.5000 mg | Freq: Once | INTRAMUSCULAR | Status: AC
  Administered 2016-09-04: 0.5 mg via INTRAVENOUS
  Filled 2016-09-04: qty 1

## 2016-09-04 MED ORDER — DEXAMETHASONE SODIUM PHOSPHATE 10 MG/ML IJ SOLN
10.0000 mg | Freq: Once | INTRAMUSCULAR | Status: AC
  Administered 2016-09-04: 10 mg via INTRAVENOUS
  Filled 2016-09-04: qty 1

## 2016-09-04 MED ORDER — OXYCODONE-ACETAMINOPHEN 5-325 MG PO TABS: 2.0000 | ORAL_TABLET | Freq: Four times a day (QID) | ORAL | 0 refills | Status: DC | PRN

## 2016-09-04 MED ORDER — CLINDAMYCIN HCL 300 MG PO CAPS: 300.0000 mg | ORAL_CAPSULE | Freq: Three times a day (TID) | ORAL | 0 refills | Status: DC

## 2016-09-04 MED ORDER — SODIUM CHLORIDE 0.9 % IV SOLN
3.0000 g | Freq: Once | INTRAVENOUS | Status: AC
  Administered 2016-09-04: 3 g via INTRAVENOUS
  Filled 2016-09-04: qty 3

## 2016-09-04 NOTE — ED Provider Notes (Signed)
[facility] Emergency Department Provider Note        Time seen: ----------------------------------------- 6:04 PM on 09/04/2016 -----------------------------------------    I have reviewed the triage vital signs and the nursing notes.   HISTORY  Chief Complaint Dental Pain and Facial Swelling    HPI Donald Hart is a 46 y.o. male who presents to ER for right-sided dental pain for several days. Patient states he woke up this morning with right-sided facial swelling and severe facial pain. Right-sided facial swelling was noted. Pain is 8 out of 10, nothing makes it better or worse. He denies fevers, chills, chest pain or shortness of breath. Patient reports that he is going to have all of his top teeth removed soon.   Past Medical History:  Diagnosis Date  . Acute pancreatitis 07/2015   hospitalization at [facility]  . Alcohol abuse   . Chronic back pain   . Family history of diabetes mellitus    sister  . Family history of heart attack    Father.   . Family history of stomach cancer   . Gout   . Hypertension   . Hypertriglyceridemia   . Type 2 diabetes mellitus (HCC) 07/2015    Patient Active Problem List   Diagnosis Date Noted  . Alcohol abuse   . Acute pancreatitis 07/03/2015  . Type 2 diabetes mellitus (HCC) 06/01/2015  . ARF (acute renal failure) (HCC) 01/19/2015  . Leukocytosis 01/19/2015    Past Surgical History:  Procedure Laterality Date  . NO PAST SURGERIES      Allergies Patient has no known allergies.  Social History Social History  Substance Use Topics  . Smoking status: Current Every Day Smoker    Packs/day: 1.00    Years: 20.00    Types: Cigarettes  . Smokeless tobacco: Never Used  . Alcohol use 100.8 oz/week    12 Cans of beer per week     Comment: none since January hospitalization    Review of Systems Constitutional: Negative for fever. ENT: Positive for toothache, facial pain and  swelling Cardiovascular: Negative for chest pain. Respiratory: Negative for shortness of breath. Gastrointestinal: Negative for abdominal pain, vomiting and diarrhea. Skin: Positive for facial erythema Neurological: Negative for headaches, focal weakness or numbness.  10-point ROS otherwise negative.  ____________________________________________   PHYSICAL EXAM:  VITAL SIGNS: ED Triage Vitals [09/04/16 1747]  Enc Vitals Group     BP (!) 207/115     Pulse Rate 99     Resp 20     Temp 98.4 F (36.9 C)     Temp Source Oral     SpO2 98 %     Weight 250 lb (113.4 kg)     Height 6\' 3"  (1.905 m)     Head Circumference      Peak Flow      Pain Score 10     Pain Loc      Pain Edu?      Excl. in GC?     Constitutional: Alert and oriented. Mild distress Eyes: Conjunctivae are normal. PERRL. Normal extraocular movements. ENT   Head: Normocephalic, right-sided facial swelling and erythema is noted   Nose: No congestion/rhinnorhea.   Mouth/Throat: Mucous membranes are moist. Widespread tooth decay, significant tenderness to percussion of the teeth fragments on the right upper jaw, particularly the right incisor   Neck: No stridor. Cardiovascular: Normal rate, regular rhythm. No murmurs, rubs, or gallops. Respiratory: Normal respiratory effort without tachypnea nor retractions.  Breath sounds are clear and equal bilaterally. No wheezes/rales/rhonchi. Gastrointestinal: Soft and nontender. Normal bowel sounds Musculoskeletal: Nontender with normal range of motion in all extremities. No lower extremity tenderness nor edema. Neurologic:  Normal speech and language. No gross focal neurologic deficits are appreciated.  Skin:  Right-sided facial erythema and swelling Psychiatric: Mood and affect are normal. Speech and behavior are normal.  ____________________________________________  ED COURSE:  Pertinent labs & imaging results that were available during my care of the  patient were reviewed by me and considered in my medical decision making (see chart for details). Patient presents to the ER with dental abscess. We will give IV antibiotics, check basic labs and reevaluate.   Procedures ____________________________________________   LABS (pertinent positives/negatives)  Labs Reviewed  CBC WITH DIFFERENTIAL/PLATELET - Abnormal; Notable for the following:       Result Value   WBC 10.9 (*)    All other components within normal limits  COMPREHENSIVE METABOLIC PANEL - Abnormal; Notable for the following:    Chloride 100 (*)    Glucose, Bld 279 (*)    Total Protein 8.2 (*)    All other components within normal limits    ____________________________________________  FINAL ASSESSMENT AND PLAN  Dental abscess  Plan: Patient with labs and imaging as dictated above. Patient looks much improved after IV Unasyn, Decadron and Dilaudid with saline infusion. He is in no distress, redness and swelling has improved significantly. He is stable for outpatient follow-up at this time.   Donald Hart, Donald Commons E, MD   Note: This note was generated in part or whole with voice recognition software. Voice recognition is usually quite accurate but there are transcription errors that can and very often do occur. I apologize for any typographical errors that were not detected and corrected.     Donald FilbertJonathan Hart Zniya Cottone, MD 09/04/16 2019

## 2016-09-04 NOTE — ED Triage Notes (Signed)
States right sided dental pain for several days, states he woke up this AM with right sided facial swelling and increased pain, right sided facial swelling noted

## 2016-09-04 NOTE — Discharge Instructions (Signed)
Go directly to emergency room as discussed.  °

## 2016-09-04 NOTE — ED Triage Notes (Signed)
Patient c/o pain in his tooth on the right upper side that started yesterday.  Patient states that he woke up this morning with swelling on the right side of his face.

## 2016-09-04 NOTE — ED Notes (Addendum)
Pt presents with dental pain that started yesterday. He reports that he awakened this morning with increased pain and facial swelling, making it difficult to swallow. Pt alert & oriented. NAD noted.

## 2016-09-04 NOTE — ED Provider Notes (Signed)
[facility] ____________________________________________  Time seen: Approximately 4:41 PM  I have reviewed the triage vital signs and the nursing notes.   HISTORY  Chief Complaint Dental Pain and Facial Swelling    HPI Selinda EonFranklin Daniel Mcinerny is a 46 y.o. male presenting for evaluation of right upper dental pain with right facial swelling. Patient reports a few days of right upper dental pain gradual increase, and reports acute onset of right facial swelling is present upon awakening this morning. Patient reports he has noticed he has multiple broken teeth and plans to have dental surgery in the near future. Reports swelling to entire right side of face and does feel some tightness around right eye. No visual changes. Also feels that he has swelling in the right neck with some discomfort when swallowing. Denies fevers. Reports has continued drinking fluids well, not eating as much due to pain and swelling. Denies injury or trauma. Denies other complaints. States pain is moderate to right upper dental area and right face at this time at 8 out of 10 unrelieved with over-the-counter medications.   Past Medical History:  Diagnosis Date  . Acute pancreatitis 07/2015   hospitalization at [facility]  . Alcohol abuse   . Chronic back pain   . Family history of diabetes mellitus    sister  . Family history of heart attack    Father.   . Family history of stomach cancer   . Gout   . Hypertension   . Hypertriglyceridemia   . Type 2 diabetes mellitus (HCC) 07/2015    Patient Active Problem List   Diagnosis Date Noted  . Alcohol abuse   . Acute pancreatitis 07/03/2015  . Type 2 diabetes mellitus (HCC) 06/01/2015  . ARF (acute renal failure) (HCC) 01/19/2015  . Leukocytosis 01/19/2015    Past Surgical History:  Procedure Laterality Date  . NO PAST SURGERIES      Current Outpatient Rx  . Order #: [id number] Class: Print  . Order #: [id number] Class: Historical Med  . Order #:  [id number] Class: Historical Med  . Order #: [id number] Class: Historical Med  . Order #: [id number] Class: Historical Med  . Order #: [id number] Class: Print  . Order #: [id number] Class: OTC  . Order #: [id number] Class: Historical Med  . Order #: [id number] Class: Historical Med  . Order #: [id number] Class: OTC    Allergies Patient has no known allergies.  Family History  Problem Relation Age of Onset  . Adopted: Yes  . Heart attack Father   . Stomach cancer Father   . Diabetes Father   . Diabetes Sister   . Diabetes Sister     Social History Social History  Substance Use Topics  . Smoking status: Current Every Day Smoker    Packs/day: 1.00    Years: 20.00    Types: Cigarettes  . Smokeless tobacco: Never Used  . Alcohol use 100.8 oz/week    12 Cans of beer per week     Comment: none since January hospitalization    Review of Systems Constitutional: No fever/chills.  Eyes: No visual changes. ENT: No sore throat. Cardiovascular: Denies chest pain. Respiratory: Denies shortness of breath. Gastrointestinal: No abdominal pain.  No nausea, no vomiting.  Genitourinary: Negative for dysuria. Musculoskeletal: Negative for back pain. Skin: Negative for rash. Neurological: Negative for focal weakness or numbness.   ____________________________________________   PHYSICAL EXAM:  VITAL SIGNS: ED Triage Vitals  Enc Vitals Group     BP 09/04/16 1624 (S) (!) 193/106     Pulse Rate  09/04/16 1624 (!) 101     Resp 09/04/16 1624 16     Temp 09/04/16 1624 98.3 F (36.8 C)     Temp Source 09/04/16 1624 Oral     SpO2 09/04/16 1624 99 %     Weight 09/04/16 1622 250 lb (113.4 kg)     Height 09/04/16 1622 6\' 3"  (1.905 m)     Head Circumference --      Peak Flow --      Pain Score 09/04/16 1625 8     Pain Loc --      Pain Edu? --      Excl. in GC? --     Constitutional: Alert and oriented. Well appearing and in no acute distress. Eyes: Conjunctivae are normal. PERRL. EOMI. Head:  Atraumatic. Ears: Bilateral ears no erythema, normal TMs.  Nose: No congestion/rhinnorhea. Mouth/Throat: Mucous membranes are moist.  No oropharyngeal swelling noted. Tolerating oral fluids well in room. Periodontal Exam   Widespread dental decay, multiple caries and multiple fractures. Tenderness noted to right upper gumline with noted fractured teeth, no palpated or visualized abscess, patient pulling away during palpation in this area. Diffuse right facial swelling and mild erythema extending from just beneath right eye to submandibular area.  Neck: No stridor.  Hematological/Lymphatic/Immunilogical: No cervical lymphadenopathy. Cardiovascular:   Normal rate, regular rhythm. Grossly normal heart sounds. Good peripheral circulation. Respiratory: Normal respiratory effort.  No retractions. Musculoskeletal: No lower or upper extremity tenderness nor edema. No midline cervical, thoracic or lumbar tenderness to palpation.  Neurologic:  Normal speech and language. Speech is normal. No gait instability. Skin:  Skin is warm, dry and intact. No rash noted. Psychiatric: Mood and affect are normal. Speech and behavior are normal.  ____________________________________________   LABS (all labs ordered are listed, but only abnormal results are displayed)  Labs Reviewed - No data to display  INITIAL IMPRESSION / ASSESSMENT AND PLAN / ED COURSE  Pertinent labs & imaging results that were available during my care of the patient were reviewed by me and considered in my medical decision making (see chart for details).  Overall well-appearing patient. No acute distress. However presenting for onset of right dental pain with associated right facial swelling. Patient with diffuse right facial swelling and submandibular swelling with erythematous thickening skin changes. Suspect secondary to dental infection. Patient also reports some discomfort when swallowing on the right side from swelling. Patient  appears stable. Patient recently hospitalized with pancreatitis and acute renal Kidney injury. Patient also noted to be hypertensive in urgent care and reports has taken medications today. Discussed with patient is concerned for possible need of IV antibiotics and further evaluation, recommended patient be seen in emergency room at this time. Patient agrees this plan. Patient reports his wife will drive him to [place] [facility]. Katie RN triage given report. Patient stable at time of discharge.  ___   FINAL CLINICAL IMPRESSION(S) / ED DIAGNOSES  Final diagnoses:  Pain, dental  Facial swelling         Renford Dills, NP 09/04/16 1826

## 2016-09-20 ENCOUNTER — Emergency Department: Payer: BLUE CROSS/BLUE SHIELD

## 2016-09-20 ENCOUNTER — Inpatient Hospital Stay
Admission: EM | Admit: 2016-09-20 | Discharge: 2016-09-30 | DRG: 439 | Disposition: A | Discharge status: Home / Self Care | Payer: BLUE CROSS/BLUE SHIELD | Attending: Internal Medicine | Admitting: Internal Medicine

## 2016-09-20 ENCOUNTER — Encounter: Payer: Self-pay | Admitting: Medical Oncology

## 2016-09-20 DIAGNOSIS — F1910 Other psychoactive substance abuse, uncomplicated: Secondary | ICD-10-CM

## 2016-09-20 DIAGNOSIS — G8929 Other chronic pain: Secondary | ICD-10-CM | POA: Diagnosis present

## 2016-09-20 DIAGNOSIS — E119 Type 2 diabetes mellitus without complications: Secondary | ICD-10-CM

## 2016-09-20 DIAGNOSIS — E669 Obesity, unspecified: Secondary | ICD-10-CM | POA: Diagnosis present

## 2016-09-20 DIAGNOSIS — K219 Gastro-esophageal reflux disease without esophagitis: Secondary | ICD-10-CM | POA: Diagnosis present

## 2016-09-20 DIAGNOSIS — R739 Hyperglycemia, unspecified: Secondary | ICD-10-CM

## 2016-09-20 DIAGNOSIS — E1165 Type 2 diabetes mellitus with hyperglycemia: Secondary | ICD-10-CM | POA: Diagnosis present

## 2016-09-20 DIAGNOSIS — I2510 Atherosclerotic heart disease of native coronary artery without angina pectoris: Secondary | ICD-10-CM | POA: Diagnosis present

## 2016-09-20 DIAGNOSIS — Z79899 Other long term (current) drug therapy: Secondary | ICD-10-CM

## 2016-09-20 DIAGNOSIS — K859 Acute pancreatitis without necrosis or infection, unspecified: Principal | ICD-10-CM | POA: Diagnosis present

## 2016-09-20 DIAGNOSIS — K5900 Constipation, unspecified: Secondary | ICD-10-CM | POA: Diagnosis present

## 2016-09-20 DIAGNOSIS — E781 Pure hyperglyceridemia: Secondary | ICD-10-CM | POA: Diagnosis not present

## 2016-09-20 DIAGNOSIS — Z794 Long term (current) use of insulin: Secondary | ICD-10-CM

## 2016-09-20 DIAGNOSIS — E785 Hyperlipidemia, unspecified: Secondary | ICD-10-CM | POA: Diagnosis present

## 2016-09-20 DIAGNOSIS — K861 Other chronic pancreatitis: Secondary | ICD-10-CM | POA: Diagnosis not present

## 2016-09-20 DIAGNOSIS — Z9114 Patient's other noncompliance with medication regimen: Secondary | ICD-10-CM | POA: Diagnosis not present

## 2016-09-20 DIAGNOSIS — F1010 Alcohol abuse, uncomplicated: Secondary | ICD-10-CM | POA: Diagnosis present

## 2016-09-20 DIAGNOSIS — Z8249 Family history of ischemic heart disease and other diseases of the circulatory system: Secondary | ICD-10-CM | POA: Diagnosis not present

## 2016-09-20 DIAGNOSIS — I10 Essential (primary) hypertension: Secondary | ICD-10-CM | POA: Diagnosis present

## 2016-09-20 DIAGNOSIS — Z800 Family history of malignant neoplasm of digestive organs: Secondary | ICD-10-CM

## 2016-09-20 DIAGNOSIS — R1010 Upper abdominal pain, unspecified: Secondary | ICD-10-CM | POA: Diagnosis present

## 2016-09-20 DIAGNOSIS — E871 Hypo-osmolality and hyponatremia: Secondary | ICD-10-CM | POA: Diagnosis present

## 2016-09-20 DIAGNOSIS — F17210 Nicotine dependence, cigarettes, uncomplicated: Secondary | ICD-10-CM | POA: Diagnosis present

## 2016-09-20 DIAGNOSIS — R1013 Epigastric pain: Secondary | ICD-10-CM

## 2016-09-20 DIAGNOSIS — K769 Liver disease, unspecified: Secondary | ICD-10-CM

## 2016-09-20 DIAGNOSIS — K760 Fatty (change of) liver, not elsewhere classified: Secondary | ICD-10-CM | POA: Diagnosis present

## 2016-09-20 DIAGNOSIS — R0789 Other chest pain: Secondary | ICD-10-CM

## 2016-09-20 DIAGNOSIS — IMO0001 Reserved for inherently not codable concepts without codable children: Secondary | ICD-10-CM

## 2016-09-20 DIAGNOSIS — Z833 Family history of diabetes mellitus: Secondary | ICD-10-CM

## 2016-09-20 DIAGNOSIS — E1140 Type 2 diabetes mellitus with diabetic neuropathy, unspecified: Secondary | ICD-10-CM | POA: Diagnosis present

## 2016-09-20 DIAGNOSIS — E860 Dehydration: Secondary | ICD-10-CM | POA: Diagnosis present

## 2016-09-20 LAB — BASIC METABOLIC PANEL
Anion gap: 19 — ABNORMAL HIGH (ref 5–15)
Anion gap: 11 (ref 5–15)
BUN: 8 mg/dL (ref 6–20)
BUN: 11 mg/dL (ref 6–20)
CALCIUM: 9 mg/dL (ref 8.9–10.3)
CHLORIDE: 88 mmol/L — AB (ref 101–111)
CO2: 23 mmol/L (ref 22–32)
CO2: 24 mmol/L (ref 22–32)
CREATININE: 0.77 mg/dL (ref 0.61–1.24)
CREATININE: 0.55 mg/dL — AB (ref 0.61–1.24)
Calcium: 8.9 mg/dL (ref 8.9–10.3)
Chloride: 87 mmol/L — ABNORMAL LOW (ref 101–111)
GFR calc Af Amer: >60 mL/min (ref >60)
GFR calc non Af Amer: >60 mL/min (ref >60)
Glucose, Bld: 337 mg/dL — ABNORMAL HIGH (ref 65–99)
Glucose, Bld: 315 mg/dL — ABNORMAL HIGH (ref 65–99)
POTASSIUM: 4.1 mmol/L (ref 3.5–5.1)
Potassium: 5.7 mmol/L — ABNORMAL HIGH (ref 3.5–5.1)
Sodium: 129 mmol/L — ABNORMAL LOW (ref 135–145)
Sodium: 123 mmol/L — ABNORMAL LOW (ref 135–145)

## 2016-09-20 LAB — HEPATIC FUNCTION PANEL
ALT: 50 U/L (ref 17–63)
AST: 53 U/L — AB (ref 15–41)
Albumin: 3.6 g/dL (ref 3.5–5.0)
Alkaline Phosphatase: 103 U/L (ref 38–126)
BILIRUBIN DIRECT: 0.5 mg/dL (ref 0.1–0.5)
BILIRUBIN INDIRECT: 2 mg/dL — AB (ref 0.3–0.9)
Total Bilirubin: 2.5 mg/dL — ABNORMAL HIGH (ref 0.3–1.2)
Total Protein: 5.7 g/dL — ABNORMAL LOW (ref 6.5–8.1)

## 2016-09-20 LAB — URINALYSIS, ROUTINE W REFLEX MICROSCOPIC
BACTERIA UA: NONE SEEN
Bilirubin Urine: NEGATIVE
KETONES UR: 20 mg/dL — AB
LEUKOCYTES UA: NEGATIVE
NITRITE: NEGATIVE
PH: 7 (ref 5.0–8.0)
Protein, ur: 100 mg/dL — AB
Specific Gravity, Urine: 1.018 (ref 1.005–1.030)
Squamous Epithelial / LPF: NONE SEEN

## 2016-09-20 LAB — CBC
HEMATOCRIT: 39.4 % — AB (ref 40.0–52.0)
Hemoglobin: 14.2 g/dL (ref 13.0–18.0)
MCH: 31.9 pg (ref 26.0–34.0)
MCHC: 36 g/dL (ref 32.0–36.0)
MCV: 88.4 fL (ref 80.0–100.0)
PLATELETS: 243 10*3/uL (ref 150–440)
RBC: 4.45 MIL/uL (ref 4.40–5.90)
RDW: 13.7 % (ref 11.5–14.5)
WBC: 16.4 10*3/uL — ABNORMAL HIGH (ref 3.8–10.6)

## 2016-09-20 LAB — TROPONIN I
Troponin I: <0.03 ng/mL (ref <0.03)
Troponin I: <0.03 ng/mL (ref <0.03)

## 2016-09-20 LAB — GLUCOSE, CAPILLARY
Glucose-Capillary: 248 mg/dL — ABNORMAL HIGH (ref 65–99)
Glucose-Capillary: 262 mg/dL — ABNORMAL HIGH (ref 65–99)
Glucose-Capillary: 271 mg/dL — ABNORMAL HIGH (ref 65–99)

## 2016-09-20 LAB — LIPID PANEL
Cholesterol: 133 mg/dL (ref 0–200)
HDL: 16 mg/dL — ABNORMAL LOW (ref >40)
LDL Cholesterol: UNDETERMINED mg/dL (ref 0–99)
Total CHOL/HDL Ratio: 8.3 RATIO
Triglycerides: 870 mg/dL — ABNORMAL HIGH (ref <150)
VLDL: UNDETERMINED mg/dL (ref 0–40)

## 2016-09-20 LAB — TSH: TSH: 0.58 u[IU]/mL (ref 0.350–4.500)

## 2016-09-20 LAB — LIPASE, BLOOD: Lipase: 23 U/L (ref 11–51)

## 2016-09-20 MED ORDER — GEMFIBROZIL 600 MG PO TABS
600.0000 mg | ORAL_TABLET | Freq: Two times a day (BID) | ORAL | Status: DC
  Administered 2016-09-20 – 2016-09-22 (×5): 600 mg via ORAL
  Filled 2016-09-20 (×5): qty 1

## 2016-09-20 MED ORDER — NICOTINE 21 MG/24HR TD PT24
21.0000 mg | MEDICATED_PATCH | Freq: Every day | TRANSDERMAL | Status: DC
  Administered 2016-09-21 – 2016-09-30 (×10): 21 mg via TRANSDERMAL
  Filled 2016-09-20 (×10): qty 1

## 2016-09-20 MED ORDER — MORPHINE SULFATE (PF) 4 MG/ML IV SOLN
6.0000 mg | Freq: Once | INTRAVENOUS | Status: AC
Start: 2016-09-20 — End: 2016-09-20
  Administered 2016-09-20: 6 mg via INTRAVENOUS
  Filled 2016-09-20: qty 2

## 2016-09-20 MED ORDER — LABETALOL HCL 5 MG/ML IV SOLN
5.0000 mg | INTRAVENOUS | Status: DC | PRN
  Administered 2016-09-25 (×2): 10 mg via INTRAVENOUS
  Filled 2016-09-20 (×4): qty 4

## 2016-09-20 MED ORDER — AMITRIPTYLINE HCL 25 MG PO TABS
25.0000 mg | ORAL_TABLET | Freq: Every day | ORAL | Status: DC
  Administered 2016-09-20 – 2016-09-28 (×9): 25 mg via ORAL
  Filled 2016-09-20 (×6): qty 1
  Filled 2016-09-20: qty 2
  Filled 2016-09-20 (×3): qty 1

## 2016-09-20 MED ORDER — SODIUM CHLORIDE 0.9 % IV BOLUS (SEPSIS)
1000.0000 mL | Freq: Once | INTRAVENOUS | Status: AC
  Administered 2016-09-20: 1000 mL via INTRAVENOUS

## 2016-09-20 MED ORDER — AMLODIPINE BESYLATE 10 MG PO TABS
10.0000 mg | ORAL_TABLET | Freq: Every day | ORAL | Status: DC
  Administered 2016-09-21 – 2016-09-30 (×10): 10 mg via ORAL
  Filled 2016-09-20 (×10): qty 1

## 2016-09-20 MED ORDER — LOSARTAN POTASSIUM 50 MG PO TABS
100.0000 mg | ORAL_TABLET | Freq: Every day | ORAL | Status: DC
  Administered 2016-09-21 – 2016-09-30 (×9): 100 mg via ORAL
  Filled 2016-09-20 (×10): qty 2

## 2016-09-20 MED ORDER — FOLIC ACID 1 MG PO TABS
1.0000 mg | ORAL_TABLET | Freq: Every day | ORAL | Status: DC
  Administered 2016-09-21 – 2016-09-30 (×10): 1 mg via ORAL
  Filled 2016-09-20 (×10): qty 1

## 2016-09-20 MED ORDER — DEXTROSE 50 % IV SOLN: 25.0000 mL | INTRAVENOUS | Status: DC | PRN

## 2016-09-20 MED ORDER — IOPAMIDOL (ISOVUE-300) INJECTION 61%
100.0000 mL | Freq: Once | INTRAVENOUS | Status: AC | PRN
  Administered 2016-09-20: 100 mL via INTRAVENOUS

## 2016-09-20 MED ORDER — OMEGA-3-ACID ETHYL ESTERS 1 G PO CAPS
2.0000 g | ORAL_CAPSULE | Freq: Two times a day (BID) | ORAL | Status: DC
  Administered 2016-09-21 – 2016-09-30 (×19): 2 g via ORAL
  Filled 2016-09-20 (×20): qty 2

## 2016-09-20 MED ORDER — CARVEDILOL 3.125 MG PO TABS
6.2500 mg | ORAL_TABLET | Freq: Two times a day (BID) | ORAL | Status: DC
  Administered 2016-09-21 – 2016-09-30 (×18): 6.25 mg via ORAL
  Filled 2016-09-20 (×2): qty 1
  Filled 2016-09-20: qty 2
  Filled 2016-09-20 (×6): qty 1
  Filled 2016-09-20: qty 2
  Filled 2016-09-20: qty 1
  Filled 2016-09-20: qty 2
  Filled 2016-09-20: qty 1
  Filled 2016-09-20: qty 2
  Filled 2016-09-20 (×2): qty 1
  Filled 2016-09-20 (×2): qty 2

## 2016-09-20 MED ORDER — GABAPENTIN 300 MG PO CAPS
300.0000 mg | ORAL_CAPSULE | Freq: Three times a day (TID) | ORAL | Status: DC
  Administered 2016-09-20 – 2016-09-30 (×29): 300 mg via ORAL
  Filled 2016-09-20 (×29): qty 1

## 2016-09-20 MED ORDER — ALLOPURINOL 100 MG PO TABS
200.0000 mg | ORAL_TABLET | Freq: Every day | ORAL | Status: DC
  Administered 2016-09-21 – 2016-09-30 (×10): 200 mg via ORAL
  Filled 2016-09-20 (×10): qty 2

## 2016-09-20 MED ORDER — PANTOPRAZOLE SODIUM 40 MG PO TBEC
40.0000 mg | DELAYED_RELEASE_TABLET | Freq: Every day | ORAL | Status: DC
  Administered 2016-09-21 – 2016-09-30 (×10): 40 mg via ORAL
  Filled 2016-09-20 (×10): qty 1

## 2016-09-20 MED ORDER — ALBUTEROL SULFATE (2.5 MG/3ML) 0.083% IN NEBU: 2.5000 mg | INHALATION_SOLUTION | RESPIRATORY_TRACT | Status: DC | PRN

## 2016-09-20 MED ORDER — SODIUM CHLORIDE 0.9 % IV SOLN
INTRAVENOUS | Status: DC
  Administered 2016-09-20: 2 [IU]/h via INTRAVENOUS
  Filled 2016-09-20 (×2): qty 2.5

## 2016-09-20 MED ORDER — ONDANSETRON HCL 4 MG/2ML IJ SOLN: 4.0000 mg | Freq: Four times a day (QID) | INTRAMUSCULAR | Status: DC | PRN

## 2016-09-20 MED ORDER — SODIUM CHLORIDE 0.9% FLUSH
3.0000 mL | Freq: Two times a day (BID) | INTRAVENOUS | Status: DC
  Administered 2016-09-20 – 2016-09-30 (×18): 3 mL via INTRAVENOUS

## 2016-09-20 MED ORDER — IOPAMIDOL (ISOVUE-300) INJECTION 61%
30.0000 mL | Freq: Once | INTRAVENOUS | Status: AC
  Administered 2016-09-20: 30 mL via ORAL

## 2016-09-20 MED ORDER — MORPHINE SULFATE (PF) 4 MG/ML IV SOLN
6.0000 mg | Freq: Once | INTRAVENOUS | Status: AC
  Administered 2016-09-20: 6 mg via INTRAVENOUS
  Filled 2016-09-20: qty 2

## 2016-09-20 MED ORDER — ONDANSETRON HCL 4 MG/2ML IJ SOLN
4.0000 mg | Freq: Once | INTRAMUSCULAR | Status: AC
  Administered 2016-09-20: 4 mg via INTRAVENOUS
  Filled 2016-09-20: qty 2

## 2016-09-20 MED ORDER — HYDROMORPHONE HCL 1 MG/ML IJ SOLN
0.5000 mg | INTRAMUSCULAR | Status: DC | PRN
  Administered 2016-09-20 – 2016-09-27 (×38): 0.5 mg via INTRAVENOUS
  Filled 2016-09-20 (×37): qty 0.5

## 2016-09-20 MED ORDER — DEXTROSE-NACL 5-0.45 % IV SOLN
INTRAVENOUS | Status: DC
  Administered 2016-09-20 – 2016-09-21 (×2): via INTRAVENOUS

## 2016-09-20 MED ORDER — ONDANSETRON HCL 4 MG PO TABS
4.0000 mg | ORAL_TABLET | Freq: Four times a day (QID) | ORAL | Status: DC | PRN
Start: 2016-09-20 — End: 2016-09-30

## 2016-09-20 MED ORDER — MAGNESIUM OXIDE 400 (241.3 MG) MG PO TABS
400.0000 mg | ORAL_TABLET | Freq: Every day | ORAL | Status: DC
  Administered 2016-09-21 – 2016-09-30 (×10): 400 mg via ORAL
  Filled 2016-09-20 (×10): qty 1

## 2016-09-20 MED ORDER — DEXTROSE 50 % IV SOLN
INTRAVENOUS | Status: AC
  Filled 2016-09-20: qty 50

## 2016-09-20 MED ORDER — SODIUM CHLORIDE 0.9 % IV SOLN: INTRAVENOUS | Status: DC

## 2016-09-20 MED ORDER — ACETAMINOPHEN 325 MG PO TABS: 650.0000 mg | ORAL_TABLET | Freq: Four times a day (QID) | ORAL | Status: DC | PRN

## 2016-09-20 MED ORDER — DICYCLOMINE HCL 10 MG PO CAPS: 10.0000 mg | ORAL_CAPSULE | Freq: Four times a day (QID) | ORAL | Status: DC | PRN

## 2016-09-20 MED ORDER — MELATONIN 5 MG PO TABS
5.0000 mg | ORAL_TABLET | Freq: Every day | ORAL | Status: DC
  Administered 2016-09-20 – 2016-09-29 (×10): 5 mg via ORAL
  Filled 2016-09-20 (×11): qty 1

## 2016-09-20 MED ORDER — ACETAMINOPHEN 650 MG RE SUPP: 650.0000 mg | Freq: Four times a day (QID) | RECTAL | Status: DC | PRN

## 2016-09-20 MED ORDER — ENOXAPARIN SODIUM 40 MG/0.4ML ~~LOC~~ SOLN
40.0000 mg | SUBCUTANEOUS | Status: DC
Start: 2016-09-20 — End: 2016-09-30
  Administered 2016-09-20 – 2016-09-29 (×10): 40 mg via SUBCUTANEOUS
  Filled 2016-09-20 (×10): qty 0.4

## 2016-09-20 MED ORDER — DOCUSATE SODIUM 100 MG PO CAPS
100.0000 mg | ORAL_CAPSULE | Freq: Two times a day (BID) | ORAL | Status: DC
Start: 2016-09-20 — End: 2016-09-30
  Administered 2016-09-20 – 2016-09-30 (×20): 100 mg via ORAL
  Filled 2016-09-20 (×20): qty 1

## 2016-09-20 MED ORDER — HYDROMORPHONE HCL 1 MG/ML IJ SOLN
0.5000 mg | Freq: Once | INTRAMUSCULAR | Status: AC
  Administered 2016-09-20: 0.5 mg via INTRAVENOUS
  Filled 2016-09-20: qty 1

## 2016-09-20 MED ORDER — MENTHOL 3 MG MT LOZG
1.0000 | LOZENGE | OROMUCOSAL | Status: DC | PRN
  Filled 2016-09-20: qty 9

## 2016-09-20 MED ORDER — HYDROMORPHONE HCL 1 MG/ML IJ SOLN
INTRAMUSCULAR | Status: AC
  Administered 2016-09-21: 0.5 mg via INTRAVENOUS
  Filled 2016-09-20: qty 1

## 2016-09-20 MED ORDER — ATORVASTATIN CALCIUM 20 MG PO TABS
40.0000 mg | ORAL_TABLET | Freq: Every day | ORAL | Status: DC
  Administered 2016-09-21 – 2016-09-30 (×10): 40 mg via ORAL
  Filled 2016-09-20 (×11): qty 2

## 2016-09-20 NOTE — ED Notes (Signed)
Lab called to alert samples will need more time due to patient's blood work being lipemic. Spoke with Darl PikesSusan in the lab.

## 2016-09-20 NOTE — H&P (Addendum)
Donald Hart is an 46 y.o. male.   Chief Complaint: Abdominal pain HPI: The patient with past medical history of hyperlipidemia, diabetes mellitus and alcohol abuse presents to the emergency department complaining of abdominal pain. The patient states that he awoke this morning with excruciating centralized and upper abdominal pain. He had 1 episode of nonbloody nonbilious emesis. He attempted to go to work where he had labs drawn for an employee health assessment which showed elevated lipids and hyperglycemia. His blood pressure was also significantly elevated which prompted him to seek evaluation in the emergency department. Initial laboratory evaluation was incomplete due to inability to run the basic metabolic panel due to elevated triglycerides. Eventually he was found to have hyponatremia as well as hypertriglyceridemia. Leukocytosis is also present. CT of the abdomen showed some peri-pancreatic stranding as well as hepatic steatosis. Right upper quadrant ultrasound showed normal gallbladder but also demonstrated possible hepatic nodules. Due to intractable pain and leukocytosis emergency [facility] staff called the hospitalist service for management of acute on chronic pancreatitis.  Past Medical History:  Diagnosis Date  . Acute pancreatitis 07/2015   hospitalization at [facility]  . Alcohol abuse   . Chronic back pain   . Family history of diabetes mellitus    sister  . Family history of heart attack    Father.   . Family history of stomach cancer   . Gout   . Hypertension   . Hypertriglyceridemia   . Type 2 diabetes mellitus ([place]) 07/2015    Past Surgical History:  Procedure Laterality Date  . NO PAST SURGERIES      Family History  Problem Relation Age of Onset  . Adopted: Yes  . Heart attack Father   . Stomach cancer Father   . Diabetes Father   . Diabetes Sister   . Diabetes Sister    Social History:  reports that he has been smoking Cigarettes.  He has a 20.00 pack-year  smoking history. He has never used smokeless tobacco. He reports that he drinks about 100.8 oz of alcohol per week . He reports that he does not use drugs.  Allergies: No Known Allergies  Prior to Admission medications   Medication Sig Start Date End Date Taking? Authorizing Provider  albuterol (PROVENTIL HFA;VENTOLIN HFA) 108 (90 Base) MCG/ACT inhaler Inhale 2 puffs into the lungs every 6 (six) hours as needed for wheezing or shortness of breath. 11/18/15  Yes Earleen Newport, MD  allopurinol (ZYLOPRIM) 100 MG tablet Take 2 tablets by mouth daily. 05/17/15  Yes Historical Provider, MD  amitriptyline (ELAVIL) 25 MG tablet Take 25 mg by mouth at bedtime.   Yes Historical Provider, MD  amLODipine (NORVASC) 10 MG tablet Take 1 tablet by mouth daily. 05/17/15  Yes Historical Provider, MD  atorvastatin (LIPITOR) 40 MG tablet Take 40 mg by mouth daily. Reported on 07/21/2015 07/12/15 09/20/16 Yes Historical Provider, MD  carvedilol (COREG) 6.25 MG tablet Take 1 tablet by mouth 2 (two) times daily. 05/22/15  Yes Historical Provider, MD  colchicine 0.6 MG tablet Take 0.6 mg by mouth daily as needed.   Yes Historical Provider, MD  diclofenac sodium (VOLTAREN) 1 % GEL Apply 2 g topically 4 (four) times daily.   Yes Historical Provider, MD  dicyclomine (BENTYL) 10 MG capsule Take 10 mg by mouth 4 (four) times daily as needed for spasms.   Yes Historical Provider, MD  folic acid (FOLVITE) 1 MG tablet Take 1 mg by mouth daily.   Yes Historical Provider,  MD  gabapentin (NEURONTIN) 300 MG capsule Take 300 mg by mouth 3 (three) times daily.   Yes Historical Provider, MD  gemfibrozil (LOPID) 600 MG tablet Take 1 tablet (600 mg total) by mouth 2 (two) times daily before a meal. 07/09/15  Yes Vaughan Basta, MD  ibuprofen (ADVIL,MOTRIN) 200 MG tablet Take 800 mg by mouth every 8 (eight) hours as needed.    Yes Historical Provider, MD  insulin NPH Human (HUMULIN N,NOVOLIN N) 100 UNIT/ML injection Inject 14 Units  into the skin at bedtime. Reported on 07/21/2015 07/17/15 09/20/16 Yes Historical Provider, MD  insulin regular (NOVOLIN R,HUMULIN R) 250 units/2.50m (100 units/mL) injection Inject 14 Units into the skin 3 (three) times daily before meals.   Yes Historical Provider, MD  losartan (COZAAR) 100 MG tablet Take 100 mg by mouth daily.   Yes Historical Provider, MD  magnesium oxide (MAG-OX) 400 MG tablet Take 400 mg by mouth daily.   Yes Historical Provider, MD  Melatonin 3 MG TABS Take 6 mg by mouth at bedtime.   Yes Historical Provider, MD  omega-3 acid ethyl esters (LOVAZA) 1 g capsule Take 2 g by mouth 2 (two) times daily.   Yes Historical Provider, MD  pantoprazole (PROTONIX) 40 MG tablet Take 40 mg by mouth daily.   Yes Historical Provider, MD  clindamycin (CLEOCIN) 300 MG capsule Take 1 capsule (300 mg total) by mouth 3 (three) times daily. Patient not taking: Reported on 09/20/2016 09/04/16   JEarleen Newport MD  guaiFENesin-dextromethorphan ([facility]) 100-10 MG/5ML syrup Take 10 mLs by mouth every 6 (six) hours as needed for cough. Patient not taking: Reported on 09/04/2016 07/15/16   ANicholes Mango MD  menthol-cetylpyridinium (CEPACOL) 3 MG lozenge Take 1 lozenge (3 mg total) by mouth as needed for sore throat. Patient not taking: Reported on 09/20/2016 07/15/16   ANicholes Mango MD  methylPREDNISolone (MEDROL) 4 MG TBPK tablet Dispense Medrol Dosepak as directed Patient not taking: Reported on 09/20/2016 09/04/16   JEarleen Newport MD  oxyCODONE-acetaminophen (PERCOCET) 5-325 MG tablet Take 2 tablets by mouth every 6 (six) hours as needed for moderate pain or severe pain. Patient not taking: Reported on 09/20/2016 09/04/16   JEarleen Newport MD     Results for orders placed or performed during the hospital encounter of 09/20/16 (from the past 48 hour(s))  Basic metabolic panel     Status: Abnormal   Collection Time: 09/20/16 12:32 PM  Result Value Ref Range   Sodium 129 (L) 135 - 145 mmol/L     Comment: POST-ULTRACENTRIFUGATION HEMOLYSIS AT THIS LEVEL MAY AFFECT RESULT    Potassium 5.7 (H) 3.5 - 5.1 mmol/L    Comment: POST-ULTRACENTRIFUGATION HEMOLYSIS AT THIS LEVEL MAY AFFECT RESULT    Chloride 87 (L) 101 - 111 mmol/L   CO2 23 22 - 32 mmol/L   Glucose, Bld 337 (H) 65 - 99 mg/dL   BUN 8 6 - 20 mg/dL   Creatinine, Ser 0.77 0.61 - 1.24 mg/dL   Calcium 9.0 8.9 - 10.3 mg/dL   GFR calc non Af Amer >60 >60 mL/min   GFR calc Af Amer >60 >60 mL/min    Comment: (NOTE) The eGFR has been calculated using the CKD EPI equation. This calculation has not been validated in all clinical situations. eGFR's persistently <60 mL/min signify possible Chronic Kidney Disease.    Anion gap 19 (H) 5 - 15  CBC     Status: Abnormal   Collection Time: 09/20/16 12:32 PM  Result Value Ref Range   WBC 16.4 (H) 3.8 - 10.6 K/uL   RBC 4.45 4.40 - 5.90 MIL/uL   Hemoglobin 14.2 13.0 - 18.0 g/dL    Comment: CORRECTED FOR LIPEMIA   HCT 39.4 (L) 40.0 - 52.0 %   MCV 88.4 80.0 - 100.0 fL   MCH 31.9 26.0 - 34.0 pg   MCHC 36.0 32.0 - 36.0 g/dL   RDW 13.7 11.5 - 14.5 %   Platelets 243 150 - 440 K/uL  Troponin I     Status: None   Collection Time: 09/20/16 12:32 PM  Result Value Ref Range   Troponin I <0.03 <0.03 ng/mL  Basic metabolic panel     Status: Abnormal   Collection Time: 09/20/16  4:13 PM  Result Value Ref Range   Sodium 123 (L) 135 - 145 mmol/L   Potassium 4.1 3.5 - 5.1 mmol/L    Comment: HEMOLYSIS AT THIS LEVEL MAY AFFECT RESULT   Chloride 88 (L) 101 - 111 mmol/L   CO2 24 22 - 32 mmol/L   Glucose, Bld 315 (H) 65 - 99 mg/dL   BUN 11 6 - 20 mg/dL   Creatinine, Ser 0.55 (L) 0.61 - 1.24 mg/dL   Calcium 8.9 8.9 - 10.3 mg/dL   GFR calc non Af Amer >60 >60 mL/min   GFR calc Af Amer >60 >60 mL/min    Comment: (NOTE) The eGFR has been calculated using the CKD EPI equation. This calculation has not been validated in all clinical situations. eGFR's persistently <60 mL/min signify possible  Chronic Kidney Disease.    Anion gap 11 5 - 15  Troponin I     Status: None   Collection Time: 09/20/16  4:13 PM  Result Value Ref Range   Troponin I <0.03 <0.03 ng/mL  Lipase, blood     Status: None   Collection Time: 09/20/16  4:13 PM  Result Value Ref Range   Lipase 23 11 - 51 U/L  Hepatic function panel     Status: Abnormal   Collection Time: 09/20/16  4:13 PM  Result Value Ref Range   Total Protein 5.7 (L) 6.5 - 8.1 g/dL   Albumin 3.6 3.5 - 5.0 g/dL   AST 53 (H) 15 - 41 U/L    Comment: HEMOLYSIS AT THIS LEVEL MAY AFFECT RESULT POST-ULTRACENTRIFUGATION    ALT 50 17 - 63 U/L    Comment: HEMOLYSIS AT THIS LEVEL MAY AFFECT RESULT POST-ULTRACENTRIFUGATION    Alkaline Phosphatase 103 38 - 126 U/L    Comment: POST-ULTRACENTRIFUGATION   Total Bilirubin 2.5 (H) 0.3 - 1.2 mg/dL    Comment: HEMOLYSIS AT THIS LEVEL MAY AFFECT RESULT POST-ULTRACENTRIFUGATION    Bilirubin, Direct 0.5 0.1 - 0.5 mg/dL    Comment: HEMOLYSIS AT THIS LEVEL MAY AFFECT RESULT POST-ULTRACENTRIFUGATION    Indirect Bilirubin 2.0 (H) 0.3 - 0.9 mg/dL  Lipid panel     Status: Abnormal   Collection Time: 09/20/16  4:13 PM  Result Value Ref Range   Cholesterol 133 0 - 200 mg/dL   Triglycerides 870 (H) <150 mg/dL   HDL 16 (L) >40 mg/dL   Total CHOL/HDL Ratio 8.3 RATIO   VLDL UNABLE TO CALCULATE IF TRIGLYCERIDE OVER 400 mg/dL 0 - 40 mg/dL   LDL Cholesterol UNABLE TO CALCULATE IF TRIGLYCERIDE OVER 400 mg/dL 0 - 99 mg/dL    Comment:        Total Cholesterol/HDL:CHD Risk Coronary Heart Disease Risk Table  Men   Women  1/2 Average Risk   3.4   3.3  Average Risk       5.0   4.4  2 X Average Risk   9.6   7.1  3 X Average Risk  23.4   11.0        Use the calculated Patient Ratio above and the CHD Risk Table to determine the patient's CHD Risk.        ATP III CLASSIFICATION (LDL):  <100     mg/dL   Optimal  100-129  mg/dL   Near or Above                    Optimal  130-159  mg/dL    Borderline  160-189  mg/dL   High  >190     mg/dL   Very High   Urinalysis, Routine w reflex microscopic     Status: Abnormal   Collection Time: 09/20/16  8:04 PM  Result Value Ref Range   Color, Urine YELLOW (A) YELLOW   APPearance CLEAR (A) CLEAR   Specific Gravity, Urine 1.018 1.005 - 1.030   pH 7.0 5.0 - 8.0   Glucose, UA >=500 (A) NEGATIVE mg/dL   Hgb urine dipstick SMALL (A) NEGATIVE   Bilirubin Urine NEGATIVE NEGATIVE   Ketones, ur 20 (A) NEGATIVE mg/dL   Protein, ur 100 (A) NEGATIVE mg/dL   Nitrite NEGATIVE NEGATIVE   Leukocytes, UA NEGATIVE NEGATIVE   RBC / HPF 0-5 0 - 5 RBC/hpf   WBC, UA 0-5 0 - 5 WBC/hpf   Bacteria, UA NONE SEEN NONE SEEN   Squamous Epithelial / LPF NONE SEEN NONE SEEN   Dg Chest 2 View  Result Date: 09/20/2016 CLINICAL DATA:  Smoker, chest pain, history of pancreatitis EXAM: CHEST  2 VIEW COMPARISON:  07/02/2016 FINDINGS: Cardiomediastinal silhouette is stable. No segmental infiltrate or focal consolidation. Subtle reticular mild interstitial prominence bilaterally. Mild interstitial edema or pneumonitis cannot be excluded. Clinical correlation is necessary. IMPRESSION: No segmental infiltrate or focal consolidation. Subtle reticular mild interstitial prominence bilaterally. Mild interstitial edema or pneumonitis cannot be excluded. Clinical correlation is necessary. Electronically Signed   By: Lahoma Crocker M.D.   On: 09/20/2016 13:00   Ct Abdomen Pelvis W Contrast  Result Date: 09/20/2016 CLINICAL DATA:  Central stabbing chest pain for 3 hours. Low abdominal pain. [name] and dizziness. History of diabetes, renal failure and pancreatitis. EXAM: CT ABDOMEN AND PELVIS WITH CONTRAST TECHNIQUE: Multidetector CT imaging of the abdomen and pelvis was performed using the standard protocol following bolus administration of intravenous contrast. CONTRAST:  192m ISOVUE-300 IOPAMIDOL (ISOVUE-300) INJECTION 61% COMPARISON:  Ultrasound same date. CT 07/12/2016  and MRI 08/20/2016. FINDINGS: Lower chest: There is focal right perihilar upper lobe ground-glass density on image number 1, measuring 19 mm. This is incompletely visualized and was not imaged on the prior study. The lung bases are otherwise clear. There is no pleural or pericardial effusion. Coronary artery atherosclerosis noted. Hepatobiliary: Diffuse hepatic steatosis. There is relative sparing around the gallbladder. Scattered small foci of rounded increased density in the liver similar to previous MRI. No new or enlarging liver lesions are seen. No evidence of gallstones, gallbladder wall thickening or biliary dilatation. Pancreas: 11 mm low-density lesion in the pancreatic head on image 40 is similar to recent MRI per there is soft tissue stranding within the retroperitoneal fat surrounding the pancreatic head. There is no focal fluid collection or ductal dilatation. Spleen: Normal  in size without focal abnormality. Adrenals/Urinary Tract: Both adrenal glands appear normal. Stable oblong cyst in the interpolar region of the right kidney. No evidence of enhancing renal mass, urinary tract calculus or hydronephrosis. The bladder appears normal. Stomach/Bowel: No evidence of bowel wall thickening, distention or surrounding inflammatory change. The appendix appears normal. Vascular/Lymphatic: Stable probable reactive nodes in the porta hepatis. No retroperitoneal lymphadenopathy. Aortic and branch vessel atherosclerosis. No acute vascular findings. Reproductive: The prostate gland and seminal vesicles appear unremarkable. Other: Stable prominent fat in both inguinal canals. Of the anterior abdominal wall otherwise appears normal. No generalized ascites. Musculoskeletal: No acute or significant osseous findings. IMPRESSION: 1. No definite acute findings or explanation for low abdominal pain. 2. Soft tissue stranding around and low-density within the pancreatic head appear similar to recent prior studies and is  probably the sequela of previous pancreatitis in this patient without current serum lipase elevation. 3. Hepatic steatosis with grossly stable hyperdense hepatic lesions. These were evaluated by MRI 1 month ago. Please refer to follow up recommendations. 4. Focal ground-glass pulmonary density in the right upper lobe, not previously imaged. This is likely inflammatory. 5. Aortic and coronary artery atherosclerosis. Electronically Signed   By: Richardean Sale M.D.   On: 09/20/2016 20:11   US Abdomen Limited Ruq  Result Date: 09/20/2016 CLINICAL DATA:  Patient with epigastric pain. EXAM: US ABDOMEN LIMITED - RIGHT UPPER QUADRANT COMPARISON:  Abdominal of are 08/20/2016 FINDINGS: Gallbladder: The gallbladder is mildly distended. No gallbladder wall thickening or pericholecystic fluid. Negative sonographic Murphy sign. Common bile duct: Diameter: 4 mm Liver: Diffusely increased in echogenicity. Suggestion of a 2.3 x 1.6 x 2.0 cm hypoechoic nodule within the right hepatic lobe. IMPRESSION: No cholelithiasis or sonographic evidence for acute cholecystitis. Gallbladder is mildly distended, nonspecific. Increased hepatic parenchymal echogenicity compatible with steatosis. Suggestion of a hypoechoic nodule (2.3 cm) within the right hepatic lobe. It is unclear if this represents a true hepatic nodule or fatty sparing. Patient had recent abdominal MRI 08/20/2016 which demonstrated multiple small hepatic lesions. Recommend follow-up as per prior MRI with additional hepatic MRI 01/2017. Electronically Signed   By: Lovey Newcomer M.D.   On: 09/20/2016 19:03    Review of Systems  Constitutional: Negative for chills and fever.  HENT: Negative for sore throat and tinnitus.   Eyes: Negative for blurred vision and redness.  Respiratory: Negative for cough and shortness of breath.   Cardiovascular: Negative for chest pain, palpitations, orthopnea and PND.  Gastrointestinal: Positive for abdominal pain, nausea and vomiting.  Negative for diarrhea.  Genitourinary: Negative for dysuria, frequency and urgency.  Musculoskeletal: Negative for joint pain and myalgias.  Skin: Negative for rash.       No lesions  Neurological: Negative for speech change, focal weakness and weakness.  Endo/Heme/Allergies: Does not bruise/bleed easily.       No temperature intolerance  Psychiatric/Behavioral: Negative for depression and suicidal ideas.    Blood pressure (!) 157/101, pulse 90, temperature 98.6 F (37 C), temperature source Oral, resp. rate 14, height 6' 3"  (1.905 m), weight 113.4 kg (250 lb), SpO2 96 %. Physical Exam  Constitutional: He is oriented to person, place, and time. He appears well-developed and well-nourished. No distress.  HENT:  Head: Normocephalic and atraumatic.  Mouth/Throat: Oropharynx is clear and moist.  Eyes: Conjunctivae and EOM are normal. Pupils are equal, round, and reactive to light. No scleral icterus.  Neck: Normal range of motion. Neck supple. No JVD present. No tracheal deviation present. No  thyromegaly present.  Cardiovascular: Normal rate, regular rhythm and normal heart sounds.  Exam reveals no gallop and no friction rub.   No murmur heard. Respiratory: Effort normal and breath sounds normal. No respiratory distress.  GI: Soft. Bowel sounds are normal. He exhibits no distension and no mass. There is tenderness (epigastric). There is no rebound and no guarding.  Genitourinary:  Genitourinary Comments: Deferred  Musculoskeletal: Normal range of motion. He exhibits no edema.  Lymphadenopathy:    He has no cervical adenopathy.  Neurological: He is alert and oriented to person, place, and time. No cranial nerve deficit.  Skin: Skin is warm. No rash noted. He is diaphoretic. No erythema.  Psychiatric: He has a normal mood and affect. His behavior is normal. Judgment and thought content normal.     Assessment/Plan [name] admitted for pancreatitis. 1. Pancreatitis:  Acute on chronic; lipase is normal but pain and etiology of hypertriglyceridemia consistent with pancreatitis. Renal function and calcium are normal. I started the patient on insulin drip and we will manage his pain as best as possible. Bowel rest. May advance to clear liquid diet as tolerated. Afebrile; does not meet criteria for sepsis 2. Hypertriglyceridemia: Normal total cholesterol. Continue statin therapy as well as gemfibrozil 3. Hyponatremia: Pseudo-; secondary to elevated lipids. 4. Diabetes mellitus type 2: Most recent hemoglobin A1c 7.1; continue insulin drip. Monitor sugars.  5. Tobacco abuse: NicoDerm patch 6. DVT prophylaxis: Lovenox 7. GI prophylaxis: Pantoprazole The patient is a full code. Time spent on admission orders and critical care approximately 45 minutes.  Harrie Foreman, MD 09/20/2016, 9:07 PM

## 2016-09-20 NOTE — ED Triage Notes (Signed)
Pt reports that he has been having central stabbing chest pain for about 3 hrs pta, pt also reports lower abd pain. C/o sob and dizziness also.

## 2016-09-20 NOTE — ED Provider Notes (Signed)
[facility] Emergency Department Provider Note ____________________________________________   I have reviewed the triage vital signs and the triage nursing note.  HISTORY  Chief Complaint Chest Pain; [name]; and Dizziness   Historian Patient  HPI Donald Hart is a 46 y.o. male with a history of pancreatitis due to alcohol in the past (does not report current use), and found to have elevated triglycerides at screening labs done at employer today.  He was also having epigastric pain today.  Gradual onset x about 1 day.  No fever.  No vomiting.  Pain extends into the lower chest.  No trouble breathing.  No diarrhea.  Pain feels like prior episodes of pancreatitis, not necessarily any worse than prior episodes.    Past Medical History:  Diagnosis Date  . Acute pancreatitis 07/2015   hospitalization at [facility]  . Alcohol abuse   . Chronic back pain   . Family history of diabetes mellitus    sister  . Family history of heart attack    Father.   . Family history of stomach cancer   . Gout   . Hypertension   . Hypertriglyceridemia   . Type 2 diabetes mellitus (HCC) 07/2015    Patient Active Problem List   Diagnosis Date Noted  . Alcohol abuse   . Acute pancreatitis 07/03/2015  . Type 2 diabetes mellitus (HCC) 06/01/2015  . ARF (acute renal failure) (HCC) 01/19/2015  . Leukocytosis 01/19/2015    Past Surgical History:  Procedure Laterality Date  . NO PAST SURGERIES      Prior to Admission medications   Medication Sig Start Date End Date Taking? Authorizing Provider  albuterol (PROVENTIL HFA;VENTOLIN HFA) 108 (90 Base) MCG/ACT inhaler Inhale 2 puffs into the lungs every 6 (six) hours as needed for wheezing or shortness of breath. 11/18/15  Yes Emily Filbert, MD  allopurinol (ZYLOPRIM) 100 MG tablet Take 2 tablets by mouth daily. 05/17/15  Yes Historical Provider, MD  amitriptyline (ELAVIL) 25 MG tablet Take 25 mg by mouth at  bedtime.   Yes Historical Provider, MD  amLODipine (NORVASC) 10 MG tablet Take 1 tablet by mouth daily. 05/17/15  Yes Historical Provider, MD  atorvastatin (LIPITOR) 40 MG tablet Take 40 mg by mouth daily. Reported on 07/21/2015 07/12/15 09/20/16 Yes Historical Provider, MD  carvedilol (COREG) 6.25 MG tablet Take 1 tablet by mouth 2 (two) times daily. 05/22/15  Yes Historical Provider, MD  colchicine 0.6 MG tablet Take 0.6 mg by mouth daily as needed.   Yes Historical Provider, MD  diclofenac sodium (VOLTAREN) 1 % GEL Apply 2 g topically 4 (four) times daily.   Yes Historical Provider, MD  dicyclomine (BENTYL) 10 MG capsule Take 10 mg by mouth 4 (four) times daily as needed for spasms.   Yes Historical Provider, MD  folic acid (FOLVITE) 1 MG tablet Take 1 mg by mouth daily.   Yes Historical Provider, MD  gabapentin (NEURONTIN) 300 MG capsule Take 300 mg by mouth 3 (three) times daily.   Yes Historical Provider, MD  gemfibrozil (LOPID) 600 MG tablet Take 1 tablet (600 mg total) by mouth 2 (two) times daily before a meal. 07/09/15  Yes Altamese Dilling, MD  ibuprofen (ADVIL,MOTRIN) 200 MG tablet Take 800 mg by mouth every 8 (eight) hours as needed.    Yes Historical Provider, MD  insulin NPH Human (HUMULIN N,NOVOLIN N) 100 UNIT/ML injection Inject 14 Units into the skin at bedtime. Reported on 07/21/2015 07/17/15 09/20/16 Yes Historical  Provider, MD  insulin regular (NOVOLIN R,HUMULIN R) 250 units/2.805mL (100 units/mL) injection Inject 14 Units into the skin 3 (three) times daily before meals.   Yes Historical Provider, MD  losartan (COZAAR) 100 MG tablet Take 100 mg by mouth daily.   Yes Historical Provider, MD  magnesium oxide (MAG-OX) 400 MG tablet Take 400 mg by mouth daily.   Yes Historical Provider, MD  Melatonin 3 MG TABS Take 6 mg by mouth at bedtime.   Yes Historical Provider, MD  omega-3 acid ethyl esters (LOVAZA) 1 g capsule Take 2 g by mouth 2 (two) times daily.   Yes Historical Provider, MD   pantoprazole (PROTONIX) 40 MG tablet Take 40 mg by mouth daily.   Yes Historical Provider, MD  clindamycin (CLEOCIN) 300 MG capsule Take 1 capsule (300 mg total) by mouth 3 (three) times daily. Patient not taking: Reported on 09/20/2016 09/04/16   Emily FilbertJonathan E Williams, MD  guaiFENesin-dextromethorphan [facility](ROBITUSSIN DM) 100-10 MG/5ML syrup Take 10 mLs by mouth every 6 (six) hours as needed for cough. Patient not taking: Reported on 09/04/2016 07/15/16   Ramonita LabAruna Gouru, MD  menthol-cetylpyridinium (CEPACOL) 3 MG lozenge Take 1 lozenge (3 mg total) by mouth as needed for sore throat. Patient not taking: Reported on 09/20/2016 07/15/16   Ramonita LabAruna Gouru, MD  methylPREDNISolone (MEDROL) 4 MG TBPK tablet Dispense Medrol Dosepak as directed Patient not taking: Reported on 09/20/2016 09/04/16   Emily FilbertJonathan E Williams, MD  oxyCODONE-acetaminophen (PERCOCET) 5-325 MG tablet Take 2 tablets by mouth every 6 (six) hours as needed for moderate pain or severe pain. Patient not taking: Reported on 09/20/2016 09/04/16   Emily FilbertJonathan E Williams, MD    No Known Allergies  Family History  Problem Relation Age of Onset  . Adopted: Yes  . Heart attack Father   . Stomach cancer Father   . Diabetes Father   . Diabetes Sister   . Diabetes Sister     Social History Social History  Substance Use Topics  . Smoking status: Current Every Day Smoker    Packs/day: 1.00    Years: 20.00    Types: Cigarettes  . Smokeless tobacco: Never Used  . Alcohol use 100.8 oz/week    12 Cans of beer per week     Comment: none since January hospitalization    Review of Systems  Constitutional: Negative for fever. Eyes: Negative for visual changes. ENT: Negative for sore throat. Cardiovascular: Negative for chest pain. Respiratory: Negative for shortness of breath. Gastrointestinal: Negative for vomiting and diarrhea. Genitourinary: Negative for dysuria. Musculoskeletal: Negative for back pain. Skin: Negative for rash. Neurological: Negative  for headache. 10 point Review of Systems otherwise negative ____________________________________________   PHYSICAL EXAM:  VITAL SIGNS: ED Triage Vitals  Enc Vitals Group     BP 09/20/16 1232 (!) 178/103     Pulse Rate 09/20/16 1232 (!) 102     Resp 09/20/16 1232 20     Temp 09/20/16 1232 98.6 F (37 C)     Temp Source 09/20/16 1232 Oral     SpO2 09/20/16 1232 97 %     Weight 09/20/16 1231 250 lb (113.4 kg)     Height 09/20/16 1231 6\' 3"  (1.905 m)     Head Circumference --      Peak Flow --      Pain Score 09/20/16 1231 9     Pain Loc --      Pain Edu? --      Excl. in GC? --  Constitutional: Alert and oriented. Well appearing and in no distress. HEENT   Head: Normocephalic and atraumatic.      Eyes: Conjunctivae are normal. PERRL. Normal extraocular movements.      Ears:         Nose: No congestion/rhinnorhea.   Mouth/Throat: Mucous membranes are moist.   Neck: No stridor. Cardiovascular/Chest: Normal rate, regular rhythm.  No murmurs, rubs, or gallops. Respiratory: Normal respiratory effort without tachypnea nor retractions. Breath sounds are clear and equal bilaterally. No wheezes/rales/rhonchi. Gastrointestinal: Soft. No distention, no guarding, no rebound. Moderate tenderness at ruq, epigastrium and luq.  No significant tenderness in lower abdomen Genitourinary/rectal:Deferred Musculoskeletal: Nontender with normal range of motion in all extremities. No joint effusions.  No lower extremity tenderness.  No edema. Neurologic:  Normal speech and language. No gross or focal neurologic deficits are appreciated. Skin:  Skin is warm, dry and intact. No rash noted. Psychiatric: Mood and affect are normal. Speech and behavior are normal. Patient exhibits appropriate insight and judgment.   ____________________________________________  LABS (pertinent positives/negatives)  Labs Reviewed  BASIC METABOLIC PANEL - Abnormal; Notable for the following:        Result Value   Sodium 129 (*)    Potassium 5.7 (*)    Chloride 87 (*)    Glucose, Bld 337 (*)    Anion gap 19 (*)    All other components within normal limits  CBC - Abnormal; Notable for the following:    WBC 16.4 (*)    HCT 39.4 (*)    All other components within normal limits  BASIC METABOLIC PANEL - Abnormal; Notable for the following:    Sodium 123 (*)    Chloride 88 (*)    Glucose, Bld 315 (*)    Creatinine, Ser 0.55 (*)    All other components within normal limits  HEPATIC FUNCTION PANEL - Abnormal; Notable for the following:    Total Protein 5.7 (*)    AST 53 (*)    Total Bilirubin 2.5 (*)    Indirect Bilirubin 2.0 (*)    All other components within normal limits  CULTURE, BLOOD (ROUTINE X 2)  CULTURE, BLOOD (ROUTINE X 2)  TROPONIN I  TROPONIN I  LIPASE, BLOOD  URINALYSIS, ROUTINE W REFLEX MICROSCOPIC    ____________________________________________    EKG I, Governor Rooks, MD, the attending physician have personally viewed and interpreted all ECGs.   99 bpm. Normal sinus rhythm. Narrow qrs.  Normal axis. Normal ST and T-wave ____________________________________________  RADIOLOGY All Xrays were viewed by me. Imaging interpreted by Radiologist.  Chest x-ray:  FINDINGS: Cardiomediastinal silhouette is stable. No segmental infiltrate or focal consolidation. Subtle reticular mild interstitial prominence bilaterally. Mild interstitial edema or pneumonitis cannot be excluded. Clinical correlation is necessary.  IMPRESSION: No segmental infiltrate or focal consolidation. Subtle reticular mild interstitial prominence bilaterally. Mild interstitial edema or pneumonitis cannot be excluded. Clinical correlation is necessary.  Ultrasound right upper quadrant:IMPRESSION: No cholelithiasis or sonographic evidence for acute cholecystitis. Gallbladder is mildly distended, nonspecific.  Increased hepatic parenchymal echogenicity compatible  with steatosis.  Suggestion of a hypoechoic nodule (2.3 cm) within the right hepatic lobe. It is unclear if this represents a true hepatic nodule or fatty sparing. Patient had recent abdominal MRI 08/20/2016 which demonstrated multiple small hepatic lesions. Recommend follow-up as per prior MRI with additional hepatic MRI 01/2017.   CT abdomen with contrast:  IMPRESSION: 1. No definite acute findings or explanation for low abdominal pain. 2. Soft tissue stranding around and  low-density within the pancreatic head appear similar to recent prior studies and is probably the sequela of previous pancreatitis in this patient without current serum lipase elevation. 3. Hepatic steatosis with grossly stable hyperdense hepatic lesions. These were evaluated by MRI 1 month ago. Please refer to follow up recommendations. 4. Focal ground-glass pulmonary density in the right upper lobe, not previously imaged. This is likely inflammatory. 5. Aortic and coronary artery atherosclerosis. __________________________________________  PROCEDURES  Procedure(s) performed: None  Critical Care performed: None  ____________________________________________   ED COURSE / ASSESSMENT AND PLAN  Pertinent labs & imaging results that were available during my care of the patient were reviewed by me and considered in my medical decision making (see chart for details).   Suspect likely pancreatitis exacerbation after speaking with and examining him.  I suspect that symptoms are not due to cardiac, but will recheck timed troponin.    I discussed adding on hepatic function panel with regard to biliary etiology, and treating symptomatically his pain.  I discussed with him whether or not to obtain CT imaging, and that I would like to avoid this given the radiation risk versus benefit, and he states it's not really any worse or different from prior episodes of pancreatitis and would like to be treated first rather  than imaged.  Repeat troponin is reassuring and negative. Repeat metabolic panel shows sodium is 123. Patient received 1 L normal saline, is getting a second liter.  I don't think it simply his hyperglycemia, as he's been higher than on the past.  His bilirubin is 2.5, I am going to obtain ultrasound of the right upper quadrant. He does have an elevated white blood cell count. I'm going out on blood cultures.  Ultrasound right upper quadrant without emergency findings. CT scan discussed with patient and chose to proceed.  CT scan shows no acute findings. There is unchanged stranding around the pancreas.  Patient required a third dose of narcotic for pain relief.   CONSULTATIONS:   Hospitalist for admission.    Patient / Family / Caregiver informed of clinical course, medical decision-making process, and agree with plan.     ___________________________________________   FINAL CLINICAL IMPRESSION(S) / ED DIAGNOSES   Final diagnoses:  Hyperglycemia  Atypical chest pain  Acute epigastric pain  Hyponatremia  Epigastric pain              Note: This dictation was prepared with Dragon dictation. Any transcriptional errors that result from this process are unintentional    Governor Rooks, MD 09/20/16 2018

## 2016-09-21 ENCOUNTER — Encounter: Payer: Self-pay | Admitting: Oncology

## 2016-09-21 LAB — GLUCOSE, CAPILLARY
Glucose-Capillary: 136 mg/dL — ABNORMAL HIGH (ref 65–99)
Glucose-Capillary: 145 mg/dL — ABNORMAL HIGH (ref 65–99)
Glucose-Capillary: 146 mg/dL — ABNORMAL HIGH (ref 65–99)
Glucose-Capillary: 146 mg/dL — ABNORMAL HIGH (ref 65–99)
Glucose-Capillary: 149 mg/dL — ABNORMAL HIGH (ref 65–99)
Glucose-Capillary: 153 mg/dL — ABNORMAL HIGH (ref 65–99)
Glucose-Capillary: 154 mg/dL — ABNORMAL HIGH (ref 65–99)
Glucose-Capillary: 155 mg/dL — ABNORMAL HIGH (ref 65–99)
Glucose-Capillary: 156 mg/dL — ABNORMAL HIGH (ref 65–99)
Glucose-Capillary: 156 mg/dL — ABNORMAL HIGH (ref 65–99)
Glucose-Capillary: 159 mg/dL — ABNORMAL HIGH (ref 65–99)
Glucose-Capillary: 160 mg/dL — ABNORMAL HIGH (ref 65–99)
Glucose-Capillary: 164 mg/dL — ABNORMAL HIGH (ref 65–99)
Glucose-Capillary: 164 mg/dL — ABNORMAL HIGH (ref 65–99)
Glucose-Capillary: 164 mg/dL — ABNORMAL HIGH (ref 65–99)
Glucose-Capillary: 165 mg/dL — ABNORMAL HIGH (ref 65–99)
Glucose-Capillary: 172 mg/dL — ABNORMAL HIGH (ref 65–99)
Glucose-Capillary: 174 mg/dL — ABNORMAL HIGH (ref 65–99)
Glucose-Capillary: 179 mg/dL — ABNORMAL HIGH (ref 65–99)
Glucose-Capillary: 189 mg/dL — ABNORMAL HIGH (ref 65–99)
Glucose-Capillary: 214 mg/dL — ABNORMAL HIGH (ref 65–99)
Glucose-Capillary: 251 mg/dL — ABNORMAL HIGH (ref 65–99)

## 2016-09-21 LAB — COMPREHENSIVE METABOLIC PANEL
ALT: 38 U/L (ref 17–63)
ANION GAP: 10 (ref 5–15)
AST: 28 U/L (ref 15–41)
Albumin: 3.1 g/dL — ABNORMAL LOW (ref 3.5–5.0)
Alkaline Phosphatase: 86 U/L (ref 38–126)
BILIRUBIN TOTAL: 1.2 mg/dL (ref 0.3–1.2)
BUN: 9 mg/dL (ref 6–20)
CHLORIDE: 96 mmol/L — AB (ref 101–111)
CO2: 26 mmol/L (ref 22–32)
Calcium: 8.2 mg/dL — ABNORMAL LOW (ref 8.9–10.3)
Creatinine, Ser: 0.59 mg/dL — ABNORMAL LOW (ref 0.61–1.24)
Glucose, Bld: 160 mg/dL — ABNORMAL HIGH (ref 65–99)
POTASSIUM: 3 mmol/L — AB (ref 3.5–5.1)
Sodium: 132 mmol/L — ABNORMAL LOW (ref 135–145)
TOTAL PROTEIN: 6.4 g/dL — AB (ref 6.5–8.1)

## 2016-09-21 LAB — LIPASE, BLOOD: LIPASE: 62 U/L — AB (ref 11–51)

## 2016-09-21 LAB — CBC
HCT: 34.8 % — ABNORMAL LOW (ref 40.0–52.0)
Hemoglobin: 12.6 g/dL — ABNORMAL LOW (ref 13.0–18.0)
MCH: 32.7 pg (ref 26.0–34.0)
MCHC: 36.3 g/dL — ABNORMAL HIGH (ref 32.0–36.0)
MCV: 90.2 fL (ref 80.0–100.0)
PLATELETS: 181 10*3/uL (ref 150–440)
RBC: 3.86 MIL/uL — ABNORMAL LOW (ref 4.40–5.90)
RDW: 13.2 % (ref 11.5–14.5)
WBC: 11.2 10*3/uL — ABNORMAL HIGH (ref 3.8–10.6)

## 2016-09-21 LAB — SODIUM
SODIUM: 134 mmol/L — AB (ref 135–145)
SODIUM: 133 mmol/L — AB (ref 135–145)

## 2016-09-21 LAB — TRIGLYCERIDES: TRIGLYCERIDES: 3809 mg/dL — AB (ref <150)

## 2016-09-21 LAB — MRSA PCR SCREENING: MRSA BY PCR: NEGATIVE

## 2016-09-21 MED ORDER — DEXMEDETOMIDINE HCL IN NACL 400 MCG/100ML IV SOLN: 0.2000 ug/kg/h | INTRAVENOUS | Status: AC

## 2016-09-21 MED ORDER — LORAZEPAM 2 MG/ML IJ SOLN: 1.0000 mg | INTRAMUSCULAR | Status: DC | PRN

## 2016-09-21 MED ORDER — ADULT MULTIVITAMIN W/MINERALS CH
1.0000 | ORAL_TABLET | Freq: Every day | ORAL | Status: DC
  Administered 2016-09-21 – 2016-09-30 (×10): 1 via ORAL
  Filled 2016-09-21 (×10): qty 1

## 2016-09-21 MED ORDER — OXYCODONE-ACETAMINOPHEN 5-325 MG PO TABS
1.0000 | ORAL_TABLET | ORAL | Status: DC | PRN
  Administered 2016-09-21 – 2016-09-23 (×9): 1 via ORAL
  Filled 2016-09-21 (×9): qty 1

## 2016-09-21 MED ORDER — VITAMIN B-1 100 MG PO TABS
100.0000 mg | ORAL_TABLET | Freq: Every day | ORAL | Status: DC
  Administered 2016-09-21 – 2016-09-27 (×7): 100 mg via ORAL
  Filled 2016-09-21 (×10): qty 1

## 2016-09-21 MED ORDER — KCL IN DEXTROSE-NACL 20-5-0.9 MEQ/L-%-% IV SOLN
INTRAVENOUS | Status: DC
  Administered 2016-09-21 – 2016-09-24 (×5): via INTRAVENOUS
  Filled 2016-09-21 (×9): qty 1000

## 2016-09-21 NOTE — Progress Notes (Signed)
[facility] - [place] at [facility]   PATIENT NAME: Donald Hart    MR#:  [id number]  DATE OF BIRTH:  08-Nov-1970  SUBJECTIVE:  CHIEF COMPLAINT:  Patient is resting comfortably reporting pain in the upper abdomen 8 out of 10. Last alcohol intake was 6 months ago per patient  REVIEW OF SYSTEMS:  CONSTITUTIONAL: No fever, fatigue or weakness.  [name], NOSE, [name], shortness of breath, [name], orthopnea, [name], [name], nocturia,  [name], [name], numbness, weakness.  PSYCHIATRY: No anxiety or depression.   DRUG ALLERGIES:  No Known Allergies  VITALS:  Blood pressure (!) 135/98, pulse 71, temperature 97.7 F (36.5 C), temperature source Oral, resp. rate 10, height 6\' 3"  (1.905 m), weight 114.8 kg (253 lb 1.4 oz), SpO2 93 %.  PHYSICAL EXAMINATION:  GENERAL:  46 y.o.-year-old patient lying in the bed with no acute distress.  EYES: Pupils equal, round, [name], normocephalic. Oropharynx and nasopharynx clear.  NECK:  Supple, [name], [name], no wheezing, rales,[name], [name], rubs, [name], [name], nondistended. Bowel sounds present. No organomegaly  EXTREMITIES: No pedal edema, cyanosis, or clubbing.  NEUROLOGIC:  Lethargic but arousable and following verbal commands Cranial nerves II through XII are intact. Muscle strength 5/5 in all extremities. Sensation intact. Gait not checked.  PSYCHIATRIC: The patient is alert and oriented x 3.  SKIN: No obvious rash, lesion, or ulcer.    LABORATORY PANEL:   CBC  Recent Labs Lab 09/21/16 0830  WBC 11.2*  HGB 12.6*  HCT 34.8*  PLT 181   ------------------------------------------------------------------------------------------------------------------  Chemistries   Recent Labs Lab 09/20/16 1613  NA 123*  K 4.1  CL 88*  CO2 24  GLUCOSE 315*  BUN 11  CREATININE 0.55*  CALCIUM 8.9  AST 53*  ALT 50  ALKPHOS 103  BILITOT 2.5*   ------------------------------------------------------------------------------------------------------------------  Cardiac Enzymes  Recent Labs Lab 09/20/16 1613  TROPONINI <0.03   ------------------------------------------------------------------------------------------------------------------  RADIOLOGY:  Dg Chest 2 View  Result Date: 09/20/2016 CLINICAL DATA:  Smoker, chest pain, history of pancreatitis EXAM: CHEST  2 VIEW COMPARISON:  07/02/2016 FINDINGS: Cardiomediastinal silhouette is stable. No segmental infiltrate or focal consolidation. Subtle reticular mild interstitial prominence bilaterally. Mild interstitial edema or pneumonitis cannot be excluded. Clinical correlation is necessary. IMPRESSION: No segmental infiltrate or focal consolidation. Subtle reticular mild interstitial prominence bilaterally. Mild interstitial edema or pneumonitis cannot be excluded. Clinical correlation is necessary. Electronically Signed   By: Natasha Mead M.D.   On: 09/20/2016 13:00   Ct Abdomen Pelvis W Contrast  Result Date: 09/20/2016 CLINICAL DATA:  Central stabbing chest pain for 3 hours. Low abdominal pain. [name] and dizziness. History of diabetes, renal failure and pancreatitis. EXAM: CT ABDOMEN AND PELVIS  WITH CONTRAST TECHNIQUE: Multidetector CT imaging of the abdomen and pelvis was performed using the standard protocol following bolus administration of intravenous contrast.  CONTRAST:  ISOVUE-300 IOPAMIDOL (ISOVUE-300) INJECTION 61% COMPARISON:  Ultrasound same date. CT 07/12/2016 and MRI 08/20/2016. FINDINGS: Lower chest: There is focal right perihilar upper lobe ground-glass density on image number 1, measuring 19 mm. This is incompletely visualized and was not imaged on the prior study. The lung bases are otherwise clear. There is no pleural or pericardial effusion. Coronary artery atherosclerosis noted. Hepatobiliary: Diffuse hepatic steatosis. There is relative sparing around the gallbladder. Scattered small foci of rounded increased density in the liver similar to previous MRI. No new or enlarging liver lesions are seen. No evidence of gallstones, gallbladder wall thickening or biliary dilatation. Pancreas: 11 mm low-density lesion in the pancreatic head on image 40 is similar to recent MRI per there is soft tissue stranding within the retroperitoneal fat surrounding the pancreatic head. There is no focal fluid collection or ductal dilatation. Spleen: Normal in size without focal abnormality. Adrenals/Urinary Tract: Both adrenal glands appear normal. Stable oblong cyst in the interpolar region of the right kidney. No evidence of enhancing renal mass, urinary tract calculus or hydronephrosis. The bladder appears normal. Stomach/Bowel: No evidence of bowel wall thickening, distention or surrounding inflammatory change. The appendix appears normal. Vascular/Lymphatic: Stable probable reactive nodes in the porta hepatis. No retroperitoneal lymphadenopathy. Aortic and branch vessel atherosclerosis. No acute vascular findings. Reproductive: The prostate gland and seminal vesicles appear unremarkable. Other: Stable prominent fat in both inguinal canals. Of the anterior abdominal wall otherwise appears normal.  No generalized ascites. Musculoskeletal: No acute or significant osseous findings. IMPRESSION: 1. No definite acute findings or explanation for low abdominal pain. 2. Soft tissue stranding around and low-density within the pancreatic head appear similar to recent prior studies and is probably the sequela of previous pancreatitis in this patient without current serum lipase elevation. 3. Hepatic steatosis with grossly stable hyperdense hepatic lesions. These were evaluated by MRI 1 month ago. Please refer to follow up recommendations. 4. Focal ground-glass pulmonary density in the right upper lobe, not previously imaged. This is likely inflammatory. 5. Aortic and coronary artery atherosclerosis. Electronically Signed   By: Carey Bullocks M.D.   On: 09/20/2016 20:11   US Abdomen Limited Ruq  Result Date: 09/20/2016 CLINICAL DATA:  Patient with epigastric pain. EXAM: US ABDOMEN LIMITED - RIGHT UPPER QUADRANT COMPARISON:  Abdominal of are 08/20/2016 FINDINGS: Gallbladder: The gallbladder is mildly distended. No gallbladder wall thickening or pericholecystic fluid. Negative sonographic Murphy sign. Common bile duct: Diameter: 4 mm Liver: Diffusely increased in echogenicity. Suggestion of a 2.3 x 1.6 x 2.0 cm hypoechoic nodule within the right hepatic lobe. IMPRESSION: No cholelithiasis or sonographic evidence for acute cholecystitis. Gallbladder is mildly distended, nonspecific. Increased hepatic parenchymal echogenicity compatible with steatosis. Suggestion of a hypoechoic nodule (2.3 cm) within the right hepatic lobe. It is unclear if this represents a true hepatic nodule or fatty sparing. Patient had recent abdominal MRI 08/20/2016 which demonstrated multiple small hepatic lesions. Recommend follow-up as per prior MRI with additional hepatic MRI 01/2017. Electronically Signed   By: Annia Belt M.D.   On: 09/20/2016 19:03    EKG:   Orders placed or performed during the hospital encounter of 09/20/16  . EKG  12-Lead  . EKG 12-Lead  . ED EKG within 10 minutes  . ED EKG within 10 minutes    ASSESSMENT AND PLAN:   [name] admitted for pancreatitis.  1. Pancreatitis: Acute on chronic; secondary to hypertriglyceridemia  lipase is normal but pain and etiology of hypertriglyceridemia  consistent with pancreatitis.  Renal function and calcium are normal.  Continue the patient on insulin drip and we will manage his pain as best as possible.  Bowel rest. May advance to clear liquid diet as tolerated.  Check a.m. labs and  2. Hypertriglyceridemia: Normal total cholesterol. Continue statin therapy as well as gemfibrozil  3. Hyponatremia: Pseudo-; secondary to elevated lipids and history of alcohol abuse/dehydration Monitor serial sodiums 135-129--123 Will consider nephrology consult if no improvement  4. Diabetes mellitus type 2: Most recent hemoglobin A1c 7.1; continue insulin drip. Monitor sugars.   5. Tobacco abuse: Counseled patient to quit smoking for 5 minutes. Patient is agreeable. We will start him on NicoDerm patch  #. History of alcohol abuse - CIWA protocol   DVT prophylaxis: Lovenox . GI prophylaxis: Pantoprazole     All the records are reviewed and case discussed with Care Management/Social Workerr. Management plans discussed with the patient,Call placed and left a voicemail to wife to call back for update CODE STATUS: fc   TOTAl TIME TAKING CARE OF THIS PATIENT: 37minutes.   POSSIBLE D/C IN 2-3 DAYS, DEPENDING ON CLINICAL CONDITION.  Note: This dictation was prepared with Dragon dictation along with smaller phrase technology. Any transcriptional errors that result from this process are unintentional.   Ramonita LabGouru, Omelia Marquart M.D on 09/21/2016 at 9:25 AM  Between 7am to 6pm - Pager - [phone] After 6pm go to [email or web address] - password [email or web address] [facility]  [facility]  Office  [phone]  CC: Primary care physician; Dione Housekeeperlmedo, Mario Ernesto, MD

## 2016-09-22 LAB — CBC
HCT: 35.3 % — ABNORMAL LOW (ref 40.0–52.0)
HEMOGLOBIN: 12.5 g/dL — AB (ref 13.0–18.0)
MCH: 32.7 pg (ref 26.0–34.0)
MCHC: 35.4 g/dL (ref 32.0–36.0)
MCV: 92.4 fL (ref 80.0–100.0)
Platelets: 172 10*3/uL (ref 150–440)
RBC: 3.82 MIL/uL — ABNORMAL LOW (ref 4.40–5.90)
RDW: 13.3 % (ref 11.5–14.5)
WBC: 8.6 10*3/uL (ref 3.8–10.6)

## 2016-09-22 LAB — GLUCOSE, CAPILLARY
Glucose-Capillary: 134 mg/dL — ABNORMAL HIGH (ref 65–99)
Glucose-Capillary: 136 mg/dL — ABNORMAL HIGH (ref 65–99)
Glucose-Capillary: 144 mg/dL — ABNORMAL HIGH (ref 65–99)
Glucose-Capillary: 144 mg/dL — ABNORMAL HIGH (ref 65–99)
Glucose-Capillary: 145 mg/dL — ABNORMAL HIGH (ref 65–99)
Glucose-Capillary: 145 mg/dL — ABNORMAL HIGH (ref 65–99)
Glucose-Capillary: 148 mg/dL — ABNORMAL HIGH (ref 65–99)
Glucose-Capillary: 151 mg/dL — ABNORMAL HIGH (ref 65–99)
Glucose-Capillary: 151 mg/dL — ABNORMAL HIGH (ref 65–99)
Glucose-Capillary: 151 mg/dL — ABNORMAL HIGH (ref 65–99)
Glucose-Capillary: 152 mg/dL — ABNORMAL HIGH (ref 65–99)
Glucose-Capillary: 152 mg/dL — ABNORMAL HIGH (ref 65–99)
Glucose-Capillary: 155 mg/dL — ABNORMAL HIGH (ref 65–99)
Glucose-Capillary: 155 mg/dL — ABNORMAL HIGH (ref 65–99)
Glucose-Capillary: 155 mg/dL — ABNORMAL HIGH (ref 65–99)
Glucose-Capillary: 156 mg/dL — ABNORMAL HIGH (ref 65–99)
Glucose-Capillary: 159 mg/dL — ABNORMAL HIGH (ref 65–99)
Glucose-Capillary: 164 mg/dL — ABNORMAL HIGH (ref 65–99)
Glucose-Capillary: 164 mg/dL — ABNORMAL HIGH (ref 65–99)
Glucose-Capillary: 165 mg/dL — ABNORMAL HIGH (ref 65–99)
Glucose-Capillary: 166 mg/dL — ABNORMAL HIGH (ref 65–99)
Glucose-Capillary: 174 mg/dL — ABNORMAL HIGH (ref 65–99)
Glucose-Capillary: 174 mg/dL — ABNORMAL HIGH (ref 65–99)

## 2016-09-22 LAB — COMPREHENSIVE METABOLIC PANEL
ALBUMIN: 2.9 g/dL — AB (ref 3.5–5.0)
ALK PHOS: 71 U/L (ref 38–126)
ALT: 51 U/L (ref 17–63)
ANION GAP: 6 (ref 5–15)
AST: 57 U/L — ABNORMAL HIGH (ref 15–41)
BILIRUBIN TOTAL: 0.7 mg/dL (ref 0.3–1.2)
BUN: 6 mg/dL (ref 6–20)
CALCIUM: 8.6 mg/dL — AB (ref 8.9–10.3)
CO2: 26 mmol/L (ref 22–32)
Chloride: 104 mmol/L (ref 101–111)
Creatinine, Ser: 0.52 mg/dL — ABNORMAL LOW (ref 0.61–1.24)
GFR calc Af Amer: >60 mL/min (ref >60)
GLUCOSE: 146 mg/dL — AB (ref 65–99)
POTASSIUM: 3.5 mmol/L (ref 3.5–5.1)
Sodium: 136 mmol/L (ref 135–145)
TOTAL PROTEIN: 6.3 g/dL — AB (ref 6.5–8.1)

## 2016-09-22 LAB — TRIGLYCERIDES
TRIGLYCERIDES: 2349 mg/dL — AB (ref <150)
TRIGLYCERIDES: 1635 mg/dL — AB (ref <150)

## 2016-09-22 LAB — LIPASE, BLOOD: Lipase: 16 U/L (ref 11–51)

## 2016-09-22 LAB — HIV ANTIBODY (ROUTINE TESTING W REFLEX): HIV Screen 4th Generation wRfx: NONREACTIVE

## 2016-09-22 MED ORDER — SODIUM CHLORIDE 0.9 % IV SOLN
INTRAVENOUS | Status: AC
  Administered 2016-09-22: 10:00:00 via INTRAVENOUS

## 2016-09-22 NOTE — Progress Notes (Signed)
Pt out of bed to chair at his request. Steady gait noted, ambulatory without assistance.

## 2016-09-22 NOTE — Progress Notes (Signed)
[facility] - [place] at [facility]   PATIENT NAME: Donald Hart    MR#:  [id number]  DATE OF BIRTH:  Dec 07, 1970  SUBJECTIVE:  CHIEF COMPLAINT:  Patient is resting comfortably reporting pain in the upper abdomen 8 out of 10.Reporting nausea. Denies any vomiting Last alcohol intake was 6 months ago per patient  REVIEW OF SYSTEMS:  CONSTITUTIONAL: No fever, fatigue or weakness.  [name], NOSE, [name], shortness of breath, [name], orthopnea, [name], [name], nocturia,  [name], [name], numbness, weakness.  PSYCHIATRY: No anxiety or depression.   DRUG ALLERGIES:  No Known Allergies  VITALS:  Blood pressure (!) 188/103, pulse 91, temperature 97.8 F (36.6 C), temperature source Oral, resp. rate 13, height 6\' 3"  (1.905 m), weight 116.4 kg (256 lb 9.9 oz), SpO2 100 %.  PHYSICAL EXAMINATION:  GENERAL:  46 y.o.-year-old patient lying in the bed with no acute distress.  EYES: Pupils equal, round, [name], normocephalic. Oropharynx and nasopharynx clear.  NECK:  Supple, [name], [name], no wheezing, rales,[name], [name], rubs, [name], [name], nondistended. Bowel sounds present. No organomegaly  EXTREMITIES: No pedal edema,  cyanosis, or clubbing.  NEUROLOGIC: Lethargic but arousable and following verbal commands Cranial nerves II through XII are intact. Muscle strength 5/5 in all extremities. Sensation intact. Gait not checked.  PSYCHIATRIC: The patient is alert and oriented x 3.  SKIN: No obvious rash, lesion, or ulcer.    LABORATORY PANEL:   CBC  Recent Labs Lab 09/22/16 0420  WBC 8.6  HGB 12.5*  HCT 35.3*  PLT 172   ------------------------------------------------------------------------------------------------------------------  Chemistries   Recent Labs Lab 09/22/16 0420  NA 136  K 3.5  CL 104  CO2 26  GLUCOSE 146*  BUN 6  CREATININE 0.52*  CALCIUM 8.6*  AST 57*  ALT 51  ALKPHOS 71  BILITOT 0.7   ------------------------------------------------------------------------------------------------------------------  Cardiac Enzymes  Recent Labs Lab 09/20/16 1613  TROPONINI <0.03   ------------------------------------------------------------------------------------------------------------------  RADIOLOGY:  Dg Chest 2 View  Result Date: 09/20/2016 CLINICAL DATA:  Smoker, chest pain, history of pancreatitis EXAM: CHEST  2 VIEW COMPARISON:  07/02/2016 FINDINGS: Cardiomediastinal silhouette is stable. No segmental infiltrate or focal consolidation. Subtle reticular mild interstitial prominence bilaterally. Mild interstitial edema or pneumonitis cannot be excluded. Clinical correlation is necessary. IMPRESSION: No segmental infiltrate or focal consolidation. Subtle reticular mild interstitial prominence bilaterally. Mild interstitial edema or pneumonitis cannot be excluded. Clinical correlation is necessary. Electronically Signed   By: Natasha Mead M.D.   On: 09/20/2016 13:00   Ct Abdomen Pelvis W Contrast  Result Date: 09/20/2016 CLINICAL DATA:  Central stabbing chest pain for 3 hours. Low abdominal pain. [name] and dizziness. History of diabetes, renal failure and  pancreatitis. EXAM: CT ABDOMEN AND PELVIS WITH CONTRAST TECHNIQUE: Multidetector CT imaging of the abdomen and pelvis was performed using the standard protocol following bolus  administration of intravenous contrast. CONTRAST:  ISOVUE-300 IOPAMIDOL (ISOVUE-300) INJECTION 61% COMPARISON:  Ultrasound same date. CT 07/12/2016 and MRI 08/20/2016. FINDINGS: Lower chest: There is focal right perihilar upper lobe ground-glass density on image number 1, measuring 19 mm. This is incompletely visualized and was not imaged on the prior study. The lung bases are otherwise clear. There is no pleural or pericardial effusion. Coronary artery atherosclerosis noted. Hepatobiliary: Diffuse hepatic steatosis. There is relative sparing around the gallbladder. Scattered small foci of rounded increased density in the liver similar to previous MRI. No new or enlarging liver lesions are seen. No evidence of gallstones, gallbladder wall thickening or biliary dilatation. Pancreas: 11 mm low-density lesion in the pancreatic head on image 40 is similar to recent MRI per there is soft tissue stranding within the retroperitoneal fat surrounding the pancreatic head. There is no focal fluid collection or ductal dilatation. Spleen: Normal in size without focal abnormality. Adrenals/Urinary Tract: Both adrenal glands appear normal. Stable oblong cyst in the interpolar region of the right kidney. No evidence of enhancing renal mass, urinary tract calculus or hydronephrosis. The bladder appears normal. Stomach/Bowel: No evidence of bowel wall thickening, distention or surrounding inflammatory change. The appendix appears normal. Vascular/Lymphatic: Stable probable reactive nodes in the porta hepatis. No retroperitoneal lymphadenopathy. Aortic and branch vessel atherosclerosis. No acute vascular findings. Reproductive: The prostate gland and seminal vesicles appear unremarkable. Other: Stable prominent fat in both inguinal canals. Of the anterior  abdominal wall otherwise appears normal. No generalized ascites. Musculoskeletal: No acute or significant osseous findings. IMPRESSION: 1. No definite acute findings or explanation for low abdominal pain. 2. Soft tissue stranding around and low-density within the pancreatic head appear similar to recent prior studies and is probably the sequela of previous pancreatitis in this patient without current serum lipase elevation. 3. Hepatic steatosis with grossly stable hyperdense hepatic lesions. These were evaluated by MRI 1 month ago. Please refer to follow up recommendations. 4. Focal ground-glass pulmonary density in the right upper lobe, not previously imaged. This is likely inflammatory. 5. Aortic and coronary artery atherosclerosis. Electronically Signed   By: Carey Bullocks M.D.   On: 09/20/2016 20:11   US Abdomen Limited Ruq  Result Date: 09/20/2016 CLINICAL DATA:  Patient with epigastric pain. EXAM: US ABDOMEN LIMITED - RIGHT UPPER QUADRANT COMPARISON:  Abdominal of are 08/20/2016 FINDINGS: Gallbladder: The gallbladder is mildly distended. No gallbladder wall thickening or pericholecystic fluid. Negative sonographic Murphy sign. Common bile duct: Diameter: 4 mm Liver: Diffusely increased in echogenicity. Suggestion of a 2.3 x 1.6 x 2.0 cm hypoechoic nodule within the right hepatic lobe. IMPRESSION: No cholelithiasis or sonographic evidence for acute cholecystitis. Gallbladder is mildly distended, nonspecific. Increased hepatic parenchymal echogenicity compatible with steatosis. Suggestion of a hypoechoic nodule (2.3 cm) within the right hepatic lobe. It is unclear if this represents a true hepatic nodule or fatty sparing. Patient had recent abdominal MRI 08/20/2016 which demonstrated multiple small hepatic lesions. Recommend follow-up as per prior MRI with additional hepatic MRI 01/2017. Electronically Signed   By: Annia Belt M.D.   On: 09/20/2016 19:03    EKG:   Orders placed or performed during the  hospital encounter of 09/20/16  . EKG 12-Lead  . EKG 12-Lead  . ED EKG within 10 minutes  . ED EKG within 10 minutes    ASSESSMENT AND PLAN:   [name] admitted for pancreatitis.  1. Pancreatitis: Acute on chronic; secondary to hypertriglyceridemia  lipase is normal but pain  and etiology of hypertriglyceridemia consistent with pancreatitis.  -Nothing by mouth with aggressive hydration Renal function and calcium are normal.  Continue the patient on insulin drip and we will manage his pain as best as possible.  Bowel rest.   Check a.m. labs   2. Hypertriglyceridemia: Normal total cholesterol. Continue statin therapy as well as gemfibrozil Triglycerides 3800--2349 today Continue insulin drip Continue Lipitor and gemfibrozil  3. Hyponatremia: Pseudo-; [name]; continue insulin drip. Monitor sugars.   5. Tobacco abuse: Counseled patient to quit smoking for 5 minutes. Patient is agreeable. We will start him on NicoDerm patch  #. History of alcohol abuse - CIWA protocol   DVT prophylaxis: Lovenox . GI prophylaxis: Pantoprazole     All the records are reviewed and case discussed with Care Management/Social Workerr. Management plans discussed with the patient,Call placed and left a voicemail to wife to call back for update CODE STATUS: fc   TOTAl critical care TIME TAKING CARE OF THIS PATIENT: 37minutes.   POSSIBLE D/C IN 2-3 DAYS, DEPENDING ON CLINICAL CONDITION.  Note: This dictation was prepared with Dragon dictation along with smaller phrase technology. Any transcriptional errors that result from this process are unintentional.   Donald Hart, Donald Hart M.D on 09/22/2016 at 12:32 PM  Between 7am to 6pm - Pager - [phone] After 6pm go to [email or web address] - password [email or web address]  [facility]  [facility]  Office  [phone]  CC: Primary care physician; Dione Housekeeperlmedo, Donald Ernesto, MD

## 2016-09-23 LAB — COMPREHENSIVE METABOLIC PANEL
ALT: 62 U/L (ref 17–63)
ALT: 71 U/L — ABNORMAL HIGH (ref 17–63)
ANION GAP: 7 (ref 5–15)
AST: 60 U/L — ABNORMAL HIGH (ref 15–41)
AST: 69 U/L — ABNORMAL HIGH (ref 15–41)
Albumin: 3.3 g/dL — ABNORMAL LOW (ref 3.5–5.0)
Albumin: 3.6 g/dL (ref 3.5–5.0)
Alkaline Phosphatase: 91 U/L (ref 38–126)
Alkaline Phosphatase: 96 U/L (ref 38–126)
Anion gap: 9 (ref 5–15)
BUN: <5 mg/dL — ABNORMAL LOW (ref 6–20)
BUN: <5 mg/dL — ABNORMAL LOW (ref 6–20)
CHLORIDE: 102 mmol/L (ref 101–111)
CO2: 27 mmol/L (ref 22–32)
CO2: 27 mmol/L (ref 22–32)
CREATININE: 0.59 mg/dL — AB (ref 0.61–1.24)
Calcium: 9 mg/dL (ref 8.9–10.3)
Calcium: 9.4 mg/dL (ref 8.9–10.3)
Chloride: 98 mmol/L — ABNORMAL LOW (ref 101–111)
Creatinine, Ser: 0.57 mg/dL — ABNORMAL LOW (ref 0.61–1.24)
GFR calc Af Amer: >60 mL/min (ref >60)
GFR calc non Af Amer: >60 mL/min (ref >60)
Glucose, Bld: 156 mg/dL — ABNORMAL HIGH (ref 65–99)
Glucose, Bld: 163 mg/dL — ABNORMAL HIGH (ref 65–99)
POTASSIUM: 3.8 mmol/L (ref 3.5–5.1)
Potassium: 3.8 mmol/L (ref 3.5–5.1)
Sodium: 136 mmol/L (ref 135–145)
Sodium: 134 mmol/L — ABNORMAL LOW (ref 135–145)
Total Bilirubin: 1 mg/dL (ref 0.3–1.2)
Total Bilirubin: 1.1 mg/dL (ref 0.3–1.2)
Total Protein: 6.9 g/dL (ref 6.5–8.1)
Total Protein: 7.6 g/dL (ref 6.5–8.1)

## 2016-09-23 LAB — CBC
HCT: 38.1 % — ABNORMAL LOW (ref 40.0–52.0)
Hemoglobin: 13.3 g/dL (ref 13.0–18.0)
MCH: 32.2 pg (ref 26.0–34.0)
MCHC: 35.1 g/dL (ref 32.0–36.0)
MCV: 91.9 fL (ref 80.0–100.0)
PLATELETS: 176 10*3/uL (ref 150–440)
RBC: 4.14 MIL/uL — ABNORMAL LOW (ref 4.40–5.90)
RDW: 13.3 % (ref 11.5–14.5)
WBC: 6.8 10*3/uL (ref 3.8–10.6)

## 2016-09-23 LAB — GLUCOSE, CAPILLARY
Glucose-Capillary: 130 mg/dL — ABNORMAL HIGH (ref 65–99)
Glucose-Capillary: 136 mg/dL — ABNORMAL HIGH (ref 65–99)
Glucose-Capillary: 142 mg/dL — ABNORMAL HIGH (ref 65–99)
Glucose-Capillary: 146 mg/dL — ABNORMAL HIGH (ref 65–99)
Glucose-Capillary: 146 mg/dL — ABNORMAL HIGH (ref 65–99)
Glucose-Capillary: 146 mg/dL — ABNORMAL HIGH (ref 65–99)
Glucose-Capillary: 149 mg/dL — ABNORMAL HIGH (ref 65–99)
Glucose-Capillary: 155 mg/dL — ABNORMAL HIGH (ref 65–99)
Glucose-Capillary: 158 mg/dL — ABNORMAL HIGH (ref 65–99)
Glucose-Capillary: 159 mg/dL — ABNORMAL HIGH (ref 65–99)
Glucose-Capillary: 160 mg/dL — ABNORMAL HIGH (ref 65–99)
Glucose-Capillary: 160 mg/dL — ABNORMAL HIGH (ref 65–99)
Glucose-Capillary: 161 mg/dL — ABNORMAL HIGH (ref 65–99)
Glucose-Capillary: 162 mg/dL — ABNORMAL HIGH (ref 65–99)
Glucose-Capillary: 162 mg/dL — ABNORMAL HIGH (ref 65–99)
Glucose-Capillary: 163 mg/dL — ABNORMAL HIGH (ref 65–99)
Glucose-Capillary: 164 mg/dL — ABNORMAL HIGH (ref 65–99)
Glucose-Capillary: 164 mg/dL — ABNORMAL HIGH (ref 65–99)
Glucose-Capillary: 165 mg/dL — ABNORMAL HIGH (ref 65–99)
Glucose-Capillary: 165 mg/dL — ABNORMAL HIGH (ref 65–99)
Glucose-Capillary: 176 mg/dL — ABNORMAL HIGH (ref 65–99)
Glucose-Capillary: 178 mg/dL — ABNORMAL HIGH (ref 65–99)
Glucose-Capillary: 179 mg/dL — ABNORMAL HIGH (ref 65–99)
Glucose-Capillary: 179 mg/dL — ABNORMAL HIGH (ref 65–99)
Glucose-Capillary: 186 mg/dL — ABNORMAL HIGH (ref 65–99)
Glucose-Capillary: 197 mg/dL — ABNORMAL HIGH (ref 65–99)

## 2016-09-23 LAB — LIPASE, BLOOD: LIPASE: 15 U/L (ref 11–51)

## 2016-09-23 LAB — TRIGLYCERIDES: Triglycerides: 1490 mg/dL — ABNORMAL HIGH (ref <150)

## 2016-09-23 MED ORDER — SODIUM CHLORIDE 0.9 % IV SOLN
INTRAVENOUS | Status: DC
  Administered 2016-09-23: 1.4 [IU]/h via INTRAVENOUS
  Administered 2016-09-24: 2.1 [IU]/h via INTRAVENOUS
  Administered 2016-09-25: 11 [IU]/h via INTRAVENOUS
  Administered 2016-09-26: 1 [IU]/h via INTRAVENOUS
  Filled 2016-09-23 (×3): qty 2.5

## 2016-09-23 MED ORDER — OXYCODONE-ACETAMINOPHEN 5-325 MG PO TABS
2.0000 | ORAL_TABLET | ORAL | Status: DC | PRN
  Administered 2016-09-23: 1 via ORAL
  Administered 2016-09-23 – 2016-09-25 (×8): 2 via ORAL
  Filled 2016-09-23 (×6): qty 2
  Filled 2016-09-23: qty 1
  Filled 2016-09-23 (×2): qty 2

## 2016-09-23 NOTE — Progress Notes (Signed)
[facility] - [place] [facility]   PATIENT NAME: Donald Hart    MR#:  [id number]  DATE OF BIRTH:  May 07, 1971  SUBJECTIVE:  CHIEF COMPLAINT:  Patient Is still complaining of abdominal pain, patient states Last alcohol intake was 6 months ago But wife reports that patient still continues to drink, noncompliant with the medications and outpatient follow-ups  REVIEW OF SYSTEMS:  CONSTITUTIONAL: No fever, fatigue or weakness.  [name], NOSE, [name], shortness of breath, [name], orthopnea, [name], [name], nocturia,  [name], [name], numbness, [name], pulse 68, [name], temperature source Oral, resp. rate (!) 8, height 6\' 3"  (1.905 m), weight 114.2 kg (251 lb 12.3 oz), SpO2 100 %.  PHYSICAL EXAMINATION:  GENERAL:  Donald Hart y.o.-year-old patient lying in the bed with no acute distress.  EYES: Pupils equal, round, [name], normocephalic. Oropharynx and nasopharynx clear.  NECK:  Supple, [name], [name], no wheezing, rales,[name], [name], rubs, [name], [name], [name], cyanosis, [name], lesion, or ulcer.    LABORATORY PANEL:   CBC  Recent Labs Lab 09/23/16 0534  WBC 6.8  HGB 13.3  HCT 38.1*  PLT 176   ------------------------------------------------------------------------------------------------------------------  Chemistries   Recent Labs Lab 09/23/16 0534  NA 136  K 3.8  CL 102  CO2 27  GLUCOSE 156*  BUN <5*  CREATININE 0.59*  CALCIUM 9.0  AST 60*  ALT 62  ALKPHOS 91  BILITOT 1.0   ------------------------------------------------------------------------------------------------------------------  Cardiac Enzymes  Recent Labs Lab 09/20/16 1613  TROPONINI <0.03   ------------------------------------------------------------------------------------------------------------------  RADIOLOGY:  No results found.  EKG:   Orders placed or performed during the hospital encounter of 09/20/16  . EKG 12-Lead  . EKG 12-Lead  . ED EKG within 10 minutes  . ED EKG within 10 minutes    ASSESSMENT AND PLAN:   [name] admitted for pancreatitis.  1. Pancreatitis: Acute on chronic; secondary to hypertriglyceridemia,[name]; [name]; [name],wife over phone agreeable with the plan CODE STATUS: fc   TOTAl critical care TIME TAKING CARE OF THIS PATIENT: .   POSSIBLE D/C IN 2-3 DAYS, DEPENDING ON CLINICAL CONDITION.  Note: This dictation was prepared with Dragon dictation along with smaller phrase technology. Any transcriptional errors that result from this process are unintentional.   Ramonita Lab M.D on 09/23/2016 at 9:47 AM  Between 7am to 6pm - Pager - [phone] After 6pm go to [email or web address] - password [email or web address] [facility]  [place] [facility]  Office  [phone]  CC: Primary care physician; [name], MD

## 2016-09-23 NOTE — Progress Notes (Signed)
Dr. Amado CoeGouru notified that patient is still on lopid, and according to pharmacist this may cause GI discomfort if not eating. Lopid held, and Dr. Amado CoeGouru stated that she would d/c for now. Trudee KusterBrandi R Mansfield

## 2016-09-23 NOTE — Progress Notes (Signed)
Patient continues insulin gtt. Continued to have ABD pain intermittently throughout day. Patient stated that he did not have lasting relief from percocet and dilaudid. Dr. Amado CoeGouru paged earlier and changed percocet to 2 tabs q4h. Patient received second tab and did report mild relief. Will continue with this plan for now. Increase in BP during end of shift, after pain medication BP improved. All other vss, report given to next shift. Trudee KusterBrandi R Mansfield

## 2016-09-24 LAB — BASIC METABOLIC PANEL
ANION GAP: 7 (ref 5–15)
ANION GAP: 6 (ref 5–15)
Anion gap: 8 (ref 5–15)
BUN: 6 mg/dL (ref 6–20)
BUN: 6 mg/dL (ref 6–20)
BUN: 6 mg/dL (ref 6–20)
CHLORIDE: 101 mmol/L (ref 101–111)
CHLORIDE: 100 mmol/L — AB (ref 101–111)
CO2: 28 mmol/L (ref 22–32)
CO2: 26 mmol/L (ref 22–32)
CO2: 28 mmol/L (ref 22–32)
Calcium: 9.5 mg/dL (ref 8.9–10.3)
Calcium: 9.1 mg/dL (ref 8.9–10.3)
Calcium: 9.2 mg/dL (ref 8.9–10.3)
Chloride: 102 mmol/L (ref 101–111)
Creatinine, Ser: 0.68 mg/dL (ref 0.61–1.24)
Creatinine, Ser: 0.47 mg/dL — ABNORMAL LOW (ref 0.61–1.24)
Creatinine, Ser: 0.63 mg/dL (ref 0.61–1.24)
GFR calc Af Amer: >60 mL/min (ref >60)
GFR calc Af Amer: >60 mL/min (ref >60)
GFR calc Af Amer: >60 mL/min (ref >60)
GFR calc non Af Amer: >60 mL/min (ref >60)
GFR calc non Af Amer: >60 mL/min (ref >60)
GLUCOSE: 133 mg/dL — AB (ref 65–99)
GLUCOSE: 123 mg/dL — AB (ref 65–99)
GLUCOSE: 52 mg/dL — AB (ref 65–99)
POTASSIUM: 3.4 mmol/L — AB (ref 3.5–5.1)
POTASSIUM: 3.4 mmol/L — AB (ref 3.5–5.1)
POTASSIUM: 3.5 mmol/L (ref 3.5–5.1)
Sodium: 136 mmol/L (ref 135–145)
Sodium: 134 mmol/L — ABNORMAL LOW (ref 135–145)
Sodium: 136 mmol/L (ref 135–145)

## 2016-09-24 LAB — GLUCOSE, CAPILLARY
Glucose-Capillary: 132 mg/dL — ABNORMAL HIGH (ref 65–99)
Glucose-Capillary: 141 mg/dL — ABNORMAL HIGH (ref 65–99)
Glucose-Capillary: 144 mg/dL — ABNORMAL HIGH (ref 65–99)
Glucose-Capillary: 147 mg/dL — ABNORMAL HIGH (ref 65–99)
Glucose-Capillary: 148 mg/dL — ABNORMAL HIGH (ref 65–99)
Glucose-Capillary: 155 mg/dL — ABNORMAL HIGH (ref 65–99)
Glucose-Capillary: 157 mg/dL — ABNORMAL HIGH (ref 65–99)
Glucose-Capillary: 157 mg/dL — ABNORMAL HIGH (ref 65–99)
Glucose-Capillary: 159 mg/dL — ABNORMAL HIGH (ref 65–99)
Glucose-Capillary: 161 mg/dL — ABNORMAL HIGH (ref 65–99)
Glucose-Capillary: 167 mg/dL — ABNORMAL HIGH (ref 65–99)
Glucose-Capillary: 168 mg/dL — ABNORMAL HIGH (ref 65–99)
Glucose-Capillary: 171 mg/dL — ABNORMAL HIGH (ref 65–99)
Glucose-Capillary: 172 mg/dL — ABNORMAL HIGH (ref 65–99)
Glucose-Capillary: 95 mg/dL (ref 65–99)

## 2016-09-24 LAB — COMPREHENSIVE METABOLIC PANEL
ALT: 65 U/L — ABNORMAL HIGH (ref 17–63)
AST: 63 U/L — ABNORMAL HIGH (ref 15–41)
Albumin: 3.2 g/dL — ABNORMAL LOW (ref 3.5–5.0)
Alkaline Phosphatase: 82 U/L (ref 38–126)
Anion gap: 7 (ref 5–15)
BUN: 6 mg/dL (ref 6–20)
CHLORIDE: 99 mmol/L — AB (ref 101–111)
CO2: 28 mmol/L (ref 22–32)
Calcium: 9 mg/dL (ref 8.9–10.3)
Creatinine, Ser: 0.66 mg/dL (ref 0.61–1.24)
Glucose, Bld: 174 mg/dL — ABNORMAL HIGH (ref 65–99)
POTASSIUM: 3.7 mmol/L (ref 3.5–5.1)
Sodium: 134 mmol/L — ABNORMAL LOW (ref 135–145)
Total Bilirubin: 1 mg/dL (ref 0.3–1.2)
Total Protein: 6.9 g/dL (ref 6.5–8.1)

## 2016-09-24 LAB — TRIGLYCERIDES
TRIGLYCERIDES: 1229 mg/dL — AB (ref <150)
Triglycerides: 1168 mg/dL — ABNORMAL HIGH (ref <150)
Triglycerides: 1005 mg/dL — ABNORMAL HIGH (ref <150)

## 2016-09-24 LAB — LIPASE, BLOOD: LIPASE: 16 U/L (ref 11–51)

## 2016-09-24 LAB — MAGNESIUM: MAGNESIUM: 1.7 mg/dL (ref 1.7–2.4)

## 2016-09-24 MED ORDER — DEXTROSE-NACL 5-0.9 % IV SOLN
INTRAVENOUS | Status: DC
  Administered 2016-09-24 – 2016-09-26 (×5): via INTRAVENOUS

## 2016-09-24 MED ORDER — POTASSIUM CHLORIDE CRYS ER 20 MEQ PO TBCR
40.0000 meq | EXTENDED_RELEASE_TABLET | Freq: Once | ORAL | Status: AC
  Administered 2016-09-24: 40 meq via ORAL
  Filled 2016-09-24: qty 2

## 2016-09-24 MED ORDER — POTASSIUM CL IN DEXTROSE 5% 20 MEQ/L IV SOLN
20.0000 meq | INTRAVENOUS | Status: DC
  Filled 2016-09-24 (×3): qty 1000

## 2016-09-24 MED ORDER — ORAL CARE MOUTH RINSE
15.0000 mL | Freq: Two times a day (BID) | OROMUCOSAL | Status: DC
  Administered 2016-09-25 (×2): 15 mL via OROMUCOSAL

## 2016-09-24 NOTE — Progress Notes (Signed)
PHARMACY - CRITICAL CARE PROGRESS NOTE  Pharmacy Consult for Electrolyte Monitoring   No Known Allergies  Patient Measurements: Height: 6\' 3"  (190.5 cm) Weight: 248 lb 7.3 oz (112.7 kg) IBW/kg (Calculated) : 84.5   Vital Signs: Temp: 97.9 F (36.6 C) (03/27 1200) Temp Source: Axillary (03/27 1200) BP: 152/92 (03/27 1600) Pulse Rate: 72 (03/27 1600) Intake/Output from previous day: 03/26 0701 - 03/27 0700 In: 2131.4 [I.V.:2131.4] Out: 2640 [Urine:2640] Intake/Output from this shift: Total I/O In: 1409.4 [P.O.:237; I.V.:1172.4] Out: 1250 [Urine:1250] Vent settings for last 24 hours:    Labs:  Recent Labs  09/22/16 0420 09/23/16 0534 09/23/16 1548 09/24/16 0841 09/24/16 1236  WBC 8.6 6.8  --   --   --   HGB 12.5* 13.3  --   --   --   HCT 35.3* 38.1*  --   --   --   PLT 172 176  --   --   --   CREATININE 0.52* 0.59* 0.57* 0.66 0.68  ALBUMIN 2.9* 3.3* 3.6 3.2*  --   PROT 6.3* 6.9 7.6 6.9  --   AST 57* 60* 69* 63*  --   ALT 51 62 71* 65*  --   ALKPHOS 71 91 96 82  --   BILITOT 0.7 1.0 1.1 1.0  --    Estimated Creatinine Clearance: 159.7 mL/min (by C-G formula based on SCr of 0.68 mg/dL).   Recent Labs  09/24/16 1141 09/24/16 1335 09/24/16 1437  GLUCAP 159* 95 161*    Microbiology: Recent Results (from the past 720 hour(s))  Culture, blood (routine x 2)     Status: None (Preliminary result)   Collection Time: 09/20/16  6:24 PM  Result Value Ref Range Status   Specimen Description BLOOD RIGHT AC  Final   Special Requests   Final    BOTTLES DRAWN AEROBIC AND ANAEROBIC Blood Culture adequate volume   Culture NO GROWTH 4 DAYS  Final   Report Status PENDING  Incomplete  Culture, blood (routine x 2)     Status: None (Preliminary result)   Collection Time: 09/20/16  6:24 PM  Result Value Ref Range Status   Specimen Description BLOOD RIGHT HAND  Final   Special Requests   Final    BOTTLES DRAWN AEROBIC AND ANAEROBIC Blood Culture adequate volume   Culture  NO GROWTH 4 DAYS  Final   Report Status PENDING  Incomplete  MRSA PCR Screening     Status: None   Collection Time: 09/21/16 10:44 AM  Result Value Ref Range Status   MRSA by PCR NEGATIVE NEGATIVE Final    Comment:        The GeneXpert MRSA Assay (FDA approved for NASAL specimens only), is one component of a comprehensive MRSA colonization surveillance program. It is not intended to diagnose MRSA infection nor to guide or monitor treatment for MRSA infections.     Assessment: 46 y/o M with pancreatitis due to hypertriglyceridemia on insulin drip.   Plan:  Will replace K orally with scheduled BMET q 4 hours as MD does not want K running in IVF at increased rate if necessary for hypoglycemia.  KCl 40 meq po once and f/u next BMET.  Luisa HartChristy, Monik Lins D 09/24/2016,4:14 PM

## 2016-09-24 NOTE — Evaluation (Signed)
Physical Therapy Evaluation Patient Details Name: Donald Hart MRN: [id number] DOB: [birth year] Today's Date: 09/24/2016   History of Present Illness  The patient with past medical history of hyperlipidemia, diabetes mellitus and alcohol abuse presents to the emergency department complaining of abdominal pain. The patient states that he awoke this morning with excruciating centralized and upper abdominal pain. He had 1 episode of nonbloody nonbilious emesis. He attempted to go to work where he had labs drawn for an employee health assessment which showed elevated lipids and hyperglycemia. His blood pressure was also significantly elevated which prompted him to seek evaluation in the emergency department. Initial laboratory evaluation was incomplete due to inability to run the basic metabolic panel due to elevated triglycerides. Eventually he was found to have hyponatremia as well as hypertriglyceridemia. Leukocytosis is also present. CT of the abdomen showed some peri-pancreatic stranding as well as hepatic steatosis. Right upper quadrant ultrasound showed normal gallbladder but also demonstrated possible hepatic nodules. Due to intractable pain and leukocytosis emergency [place] staff called the hospitalist service for management of acute on chronic pancreatitis. Pt currently admitted to ICU. He reports history of prolonged admission at [facility] for the same complaints last fall. At that time he underwent extensive physical therapy due to severe weakness.   Clinical Impression  Pt demonstrates independence with bed mobility and transfers. Supervision only for ambulation around RN station in CCU. VSS throughout ambulation with SaO2>95% on room air and HR below 100 bpm. Pt reports that his mobility is at baseline currently. He presents with some higher level balance deficits but this is chronic prior to admission. 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 No PT needs identified  at this time. PT order will be completed. Please enter new order if status or needs change. Pt encouraged to remain active during admission and ambulate frequently with staff.      Follow Up Recommendations No PT follow up    Equipment Recommendations  None recommended by PT    Recommendations for Other Services       Precautions / Restrictions Precautions Precautions: None Restrictions Weight Bearing Restrictions: No      Mobility  Bed Mobility Overal bed mobility: Independent             General bed mobility comments: good speed/sequencing  Transfers Overall transfer level: Independent Equipment used: None             General transfer comment: Good speed, sequencing, and stability noted  Ambulation/Gait Ambulation/Gait assistance: Supervision Ambulation Distance (Feet): 180 Feet Assistive device: None Gait Pattern/deviations: Decreased step length - right;Decreased step length - left Gait velocity: WFL for full community mobility   General Gait Details: Pt able to easily ambulate around RN station in CCU. VSS throughout amulation with SaO2>95% on room air and HR below 100 bpm. Pt reports that his mobility is at baseline currently.   Stairs            Wheelchair Mobility    Modified Rankin (Stroke Patients Only)       Balance Overall balance assessment: Needs assistance Sitting-balance support: No upper extremity supported Sitting balance-Leahy Scale: Good     Standing balance support: No upper extremity supported Standing balance-Leahy Scale: Fair Standing balance comment: Positive Rhomberg due to history of chronic bilateral LE neuropathy. Single leg balance approximately 2-3 seconds bilaterally  Pertinent Vitals/Pain Pain Assessment: 0-10 Pain Score: 7  Pain Location: RUQ abdomen Pain Intervention(s): Monitored during session;Other (comment) (RN aware)    Home Living Family/patient expects to  be discharged to:: Private residence Living Arrangements: Spouse/significant other Available Help at Discharge: Family Type of Home: Mobile home Home Access: Stairs to enter Entrance Stairs-Rails: Right;Left;Can reach both Entrance Stairs-Number of Steps: 5 Home Layout: One level Home Equipment: Walker - 2 wheels;Crutches (no cane, shower chair, or BSC)      Prior Function Level of Independence: Independent         Comments: Pt works full time at [employer]. Independent with driving, ADLs, and IADLs. Denies falls in the last 12 months     Hand Dominance   Dominant Hand: Right    Extremity/Trunk Assessment   Upper Extremity Assessment Upper Extremity Assessment: Overall WFL for tasks assessed;RUE deficits/detail RUE Deficits / Details: Pt reports some R elbow pain with resisted extension    Lower Extremity Assessment Lower Extremity Assessment: Overall WFL for tasks assessed       Communication   Communication: No difficulties  Cognition Arousal/Alertness: Awake/alert Behavior During Therapy: WFL for tasks assessed/performed Overall Cognitive Status: Within Functional Limits for tasks assessed                                        General Comments      Exercises     Assessment/Plan    PT Assessment Patent does not need any further PT services  PT Problem List         PT Treatment Interventions      PT Goals (Current goals can be found in the Care Plan section)  Acute Rehab PT Goals Patient Stated Goal: Return home PT Goal Formulation: All assessment and education complete, DC therapy    Frequency     Barriers to discharge        Co-evaluation               End of Session Equipment Utilized During Treatment: Gait belt Activity Tolerance: Patient tolerated treatment well Patient left: in chair;with call bell/phone within reach Nurse Communication: Mobility status;Other (comment) (Pt up in recliner)      Time: [id number] PT  Time Calculation (min) (ACUTE ONLY): 20 min   Charges:   PT Evaluation $PT Eval Low Complexity: 1 Procedure     PT G Codes:        Donald Hart PT, DPT   Donald Hart 09/24/2016, 11:01 AM

## 2016-09-24 NOTE — Progress Notes (Signed)
Patient sinus brady multiple times since 0500 sometimes asleep sometimes awake. Dr. Sheryle Hailiamond aware. HR currently 47. Blood pressure 169/94.

## 2016-09-24 NOTE — Progress Notes (Signed)
PHARMACY - CRITICAL CARE PROGRESS NOTE  Pharmacy Consult for Electrolyte Monitoring   No Known Allergies  Patient Measurements: Height: 6\' 3"  (190.5 cm) Weight: 248 lb 7.3 oz (112.7 kg) IBW/kg (Calculated) : 84.5   Vital Signs: Temp: 98.1 F (36.7 C) (03/27 2000) Temp Source: Axillary (03/27 2000) BP: 146/93 (03/27 2035) Pulse Rate: 69 (03/27 2035) Intake/Output from previous day: 03/26 0701 - 03/27 0700 In: 2131.4 [I.V.:2131.4] Out: 2640 [Urine:2640] Intake/Output from this shift: Total I/O In: 462 [P.O.:240; I.V.:222] Out: 300 [Urine:300] Vent settings for last 24 hours:    Labs:  Recent Labs  09/22/16 0420 09/23/16 0534 09/23/16 1548 09/24/16 0841 09/24/16 1236 09/24/16 1547 09/24/16 1943  WBC 8.6 6.8  --   --   --   --   --   HGB 12.5* 13.3  --   --   --   --   --   HCT 35.3* 38.1*  --   --   --   --   --   PLT 172 176  --   --   --   --   --   CREATININE 0.52* 0.59* 0.57* 0.66 0.68 0.47* 0.63  MG  --   --   --   --   --   --  1.7  ALBUMIN 2.9* 3.3* 3.6 3.2*  --   --   --   PROT 6.3* 6.9 7.6 6.9  --   --   --   AST 57* 60* 69* 63*  --   --   --   ALT 51 62 71* 65*  --   --   --   ALKPHOS 71 91 96 82  --   --   --   BILITOT 0.7 1.0 1.1 1.0  --   --   --    Estimated Creatinine Clearance: 159.7 mL/min (by C-G formula based on SCr of 0.63 mg/dL).   Recent Labs  09/24/16 1437 09/24/16 1811 09/24/16 2055  GLUCAP 161* 147* 132*    Microbiology: Recent Results (from the past 720 hour(s))  Culture, blood (routine x 2)     Status: None (Preliminary result)   Collection Time: 09/20/16  6:24 PM  Result Value Ref Range Status   Specimen Description BLOOD RIGHT AC  Final   Special Requests   Final    BOTTLES DRAWN AEROBIC AND ANAEROBIC Blood Culture adequate volume   Culture NO GROWTH 4 DAYS  Final   Report Status PENDING  Incomplete  Culture, blood (routine x 2)     Status: None (Preliminary result)   Collection Time: 09/20/16  6:24 PM  Result Value  Ref Range Status   Specimen Description BLOOD RIGHT HAND  Final   Special Requests   Final    BOTTLES DRAWN AEROBIC AND ANAEROBIC Blood Culture adequate volume   Culture NO GROWTH 4 DAYS  Final   Report Status PENDING  Incomplete  MRSA PCR Screening     Status: None   Collection Time: 09/21/16 10:44 AM  Result Value Ref Range Status   MRSA by PCR NEGATIVE NEGATIVE Final    Comment:        The GeneXpert MRSA Assay (FDA approved for NASAL specimens only), is one component of a comprehensive MRSA colonization surveillance program. It is not intended to diagnose MRSA infection nor to guide or monitor treatment for MRSA infections.     Assessment: 46 y/o M with pancreatitis due to hypertriglyceridemia on insulin drip.   Plan:  Will replace K orally with scheduled BMET q 4 hours as MD does not want K running in IVF at increased rate if necessary for hypoglycemia.  KCl 40 meq po once and f/u next BMET.  3/27 @ 20:00 :   K = 3.5, Mag = 1.7 .   No electrolyte supplementation needed at this time.   Donald Hart D 09/24/2016,9:17 PM

## 2016-09-24 NOTE — Progress Notes (Addendum)
Request MD Gouru change percocet to roxicodone; pt's AST and ALT are elevated Md states she will order roxicodone

## 2016-09-24 NOTE — Progress Notes (Signed)
[facility] -  at [facility]   PATIENT NAME: Donald Hart    MR#:  [id number]  DATE OF BIRTH:  Dec 29, 1970  SUBJECTIVE:  CHIEF COMPLAINT:  Patient Is feeling better, hungry, abd pain is better  REVIEW OF SYSTEMS:  CONSTITUTIONAL: No fever, fatigue or weakness.  [name], NOSE, [name], shortness of breath, [name], orthopnea, [name], [name], nocturia,  [name], [name], numbness, weakness.  PSYCHIATRY: No anxiety or depression.   DRUG ALLERGIES:  No Known Allergies  VITALS:  Blood pressure (!) 166/148, pulse 84, temperature 97.8 F (36.6 C), temperature source Axillary, resp. rate 19, height 6\' 3"  (1.905 m), weight 112.7 kg (248 lb 7.3 oz), SpO2 98 %.  PHYSICAL EXAMINATION:  GENERAL:  46 y.o.-year-old patient lying in the bed with no acute distress.  EYES: Pupils equal, round, [name], normocephalic. Oropharynx and nasopharynx clear.  NECK:  Supple, [name], [name], no wheezing, rales,[name], [name], rubs, [name], [name], nondistended. Bowel sounds present. No organomegaly  EXTREMITIES: No pedal edema, cyanosis, or clubbing.  NEUROLOGIC:She Cranial nerves II through XII are intact. Muscle strength 5/5 in all  extremities. Sensation intact. Gait not checked.  PSYCHIATRIC: The patient is alert and oriented x 3.  SKIN: No obvious rash, lesion, or ulcer.    LABORATORY PANEL:   CBC  Recent Labs Lab 09/23/16 0534  WBC 6.8  HGB 13.3  HCT 38.1*  PLT 176   ------------------------------------------------------------------------------------------------------------------  Chemistries   Recent Labs Lab 09/24/16 0841  NA 134*  K 3.7  CL 99*  CO2 28  GLUCOSE 174*  BUN 6  CREATININE 0.66  CALCIUM 9.0  AST 63*  ALT 65*  ALKPHOS 82  BILITOT 1.0   ------------------------------------------------------------------------------------------------------------------  Cardiac Enzymes  Recent Labs Lab 09/20/16 1613  TROPONINI <0.03   ------------------------------------------------------------------------------------------------------------------  RADIOLOGY:  No results found.  EKG:   Orders placed or performed during the hospital encounter of 09/20/16  . EKG 12-Lead  . EKG 12-Lead  . ED EKG within 10 minutes  . ED EKG within 10 minutes    ASSESSMENT AND PLAN:   [name] admitted for pancreatitis.  1. Pancreatitis: Acute on chronic; secondary to hypertriglyceridemia,Worsened by alcohol abuse not sure if he has any component of genetic predisposition  lipase is normal but pain and etiology of hypertriglyceridemia consistent with pancreatitis.  -clear liquids today  aggressive hydration Renal function and calcium are normal.  Continue the patient on insulin drip and we will manage his pain as best as possible.  Bowel rest.   Check a.m. labs   2. Hypertriglyceridemia: Normal total cholesterol. Continue statin therapy as well as gemfibrozil Triglycerides 3800--2349--1490 --1169 Continue insulin drip until TG close to 500, serial BMPS while on insulin gtt Continue Lipitor and hold gemfibrozil  3. Hyponatremia: Pseudo-; secondary to elevated lipids and  history of alcohol abuse/dehydration Resolved  with IV fluids Monitor serial sodiums 135-129--123--136   4. Diabetes mellitus type 2: Most recent hemoglobin A1c 7.1;  continue insulin drip per pharmacy Monitor sugars. Repeat hemoglobin A1c   5. Tobacco abuse: Counseled patient to quit smoking for 5 minutes. Patient is agreeable. on NicoDerm patch  #. History of alcohol abuse - CIWA protocol   DVT prophylaxis: Lovenox . GI prophylaxis: Pantoprazole  PT- no PT needs identified    All the records are reviewed and case discussed with Care Management/Social Workerr. Management plans discussed with the patient,wife over phone agreeable with the plan CODE STATUS: fc   TOTAl critical care TIME TAKING CARE OF THIS PATIENT: 36minutes.   POSSIBLE D/C IN 2-3 DAYS, DEPENDING ON CLINICAL CONDITION.  Note: This dictation was prepared with Dragon dictation along with smaller phrase technology. Any transcriptional errors that result from this process are unintentional.   Ramonita LabGouru, Ruxin Ransome M.D on 09/24/2016 at 11:43 AM  Between 7am to 6pm - Pager - [phone] After 6pm go to [email or web address] - password [email or web address] [facility]  [facility]  Office  [phone]  CC: Primary care physician; Dione Housekeeperlmedo, Mario Ernesto, MD

## 2016-09-25 DIAGNOSIS — R0789 Other chest pain: Secondary | ICD-10-CM

## 2016-09-25 DIAGNOSIS — F1910 Other psychoactive substance abuse, uncomplicated: Secondary | ICD-10-CM

## 2016-09-25 DIAGNOSIS — K859 Acute pancreatitis without necrosis or infection, unspecified: Principal | ICD-10-CM

## 2016-09-25 DIAGNOSIS — K861 Other chronic pancreatitis: Secondary | ICD-10-CM

## 2016-09-25 DIAGNOSIS — E781 Pure hyperglyceridemia: Secondary | ICD-10-CM

## 2016-09-25 DIAGNOSIS — K769 Liver disease, unspecified: Secondary | ICD-10-CM

## 2016-09-25 LAB — CULTURE, BLOOD (ROUTINE X 2)
CULTURE: NO GROWTH
Culture: NO GROWTH
SPECIAL REQUESTS: ADEQUATE
Special Requests: ADEQUATE

## 2016-09-25 LAB — COMPREHENSIVE METABOLIC PANEL
ALT: 57 U/L (ref 17–63)
AST: 42 U/L — AB (ref 15–41)
Albumin: 3.5 g/dL (ref 3.5–5.0)
Alkaline Phosphatase: 84 U/L (ref 38–126)
Anion gap: 5 (ref 5–15)
BILIRUBIN TOTAL: 0.6 mg/dL (ref 0.3–1.2)
BUN: 6 mg/dL (ref 6–20)
CALCIUM: 9.2 mg/dL (ref 8.9–10.3)
CO2: 30 mmol/L (ref 22–32)
CREATININE: 0.56 mg/dL — AB (ref 0.61–1.24)
Chloride: 103 mmol/L (ref 101–111)
Glucose, Bld: 62 mg/dL — ABNORMAL LOW (ref 65–99)
Potassium: 3.7 mmol/L (ref 3.5–5.1)
Sodium: 138 mmol/L (ref 135–145)
TOTAL PROTEIN: 7.2 g/dL (ref 6.5–8.1)

## 2016-09-25 LAB — BASIC METABOLIC PANEL
ANION GAP: 8 (ref 5–15)
ANION GAP: 7 (ref 5–15)
Anion gap: 9 (ref 5–15)
Anion gap: 7 (ref 5–15)
Anion gap: 6 (ref 5–15)
BUN: 5 mg/dL — AB (ref 6–20)
BUN: 6 mg/dL (ref 6–20)
BUN: 6 mg/dL (ref 6–20)
BUN: 7 mg/dL (ref 6–20)
CALCIUM: 9.1 mg/dL (ref 8.9–10.3)
CALCIUM: 9 mg/dL (ref 8.9–10.3)
CHLORIDE: 101 mmol/L (ref 101–111)
CHLORIDE: 100 mmol/L — AB (ref 101–111)
CO2: 26 mmol/L (ref 22–32)
CO2: 26 mmol/L (ref 22–32)
CO2: 27 mmol/L (ref 22–32)
CO2: 27 mmol/L (ref 22–32)
CO2: 28 mmol/L (ref 22–32)
CREATININE: 0.64 mg/dL (ref 0.61–1.24)
CREATININE: 0.47 mg/dL — AB (ref 0.61–1.24)
CREATININE: 0.41 mg/dL — AB (ref 0.61–1.24)
Calcium: 9 mg/dL (ref 8.9–10.3)
Calcium: 9.1 mg/dL (ref 8.9–10.3)
Calcium: 9.4 mg/dL (ref 8.9–10.3)
Chloride: 100 mmol/L — ABNORMAL LOW (ref 101–111)
Chloride: 101 mmol/L (ref 101–111)
Chloride: 102 mmol/L (ref 101–111)
Creatinine, Ser: 0.44 mg/dL — ABNORMAL LOW (ref 0.61–1.24)
Creatinine, Ser: 0.58 mg/dL — ABNORMAL LOW (ref 0.61–1.24)
GFR calc Af Amer: >60 mL/min (ref >60)
GFR calc Af Amer: >60 mL/min (ref >60)
GFR calc non Af Amer: >60 mL/min (ref >60)
GFR calc non Af Amer: >60 mL/min (ref >60)
GFR calc non Af Amer: >60 mL/min (ref >60)
GFR calc non Af Amer: >60 mL/min (ref >60)
GLUCOSE: 135 mg/dL — AB (ref 65–99)
GLUCOSE: 170 mg/dL — AB (ref 65–99)
Glucose, Bld: 154 mg/dL — ABNORMAL HIGH (ref 65–99)
Glucose, Bld: 145 mg/dL — ABNORMAL HIGH (ref 65–99)
Glucose, Bld: 71 mg/dL (ref 65–99)
POTASSIUM: 3.7 mmol/L (ref 3.5–5.1)
POTASSIUM: 3.9 mmol/L (ref 3.5–5.1)
Potassium: 3.9 mmol/L (ref 3.5–5.1)
Potassium: 3.6 mmol/L (ref 3.5–5.1)
Potassium: 3.6 mmol/L (ref 3.5–5.1)
SODIUM: 136 mmol/L (ref 135–145)
Sodium: 134 mmol/L — ABNORMAL LOW (ref 135–145)
Sodium: 134 mmol/L — ABNORMAL LOW (ref 135–145)
Sodium: 135 mmol/L (ref 135–145)
Sodium: 136 mmol/L (ref 135–145)

## 2016-09-25 LAB — HEMOGLOBIN A1C
HEMOGLOBIN A1C: 9.9 % — AB (ref 4.8–5.6)
MEAN PLASMA GLUCOSE: 237 mg/dL

## 2016-09-25 LAB — CBC WITH DIFFERENTIAL/PLATELET
BASOS ABS: 0.1 10*3/uL (ref 0–0.1)
Basophils Relative: 1 %
EOS PCT: 5 %
Eosinophils Absolute: 0.3 10*3/uL (ref 0–0.7)
HEMATOCRIT: 39.8 % — AB (ref 40.0–52.0)
Hemoglobin: 13.9 g/dL (ref 13.0–18.0)
LYMPHS ABS: 2.2 10*3/uL (ref 1.0–3.6)
LYMPHS PCT: 31 %
MCH: 31.9 pg (ref 26.0–34.0)
MCHC: 34.8 g/dL (ref 32.0–36.0)
MCV: 91.8 fL (ref 80.0–100.0)
MONO ABS: 0.8 10*3/uL (ref 0.2–1.0)
Monocytes Relative: 11 %
NEUTROS ABS: 3.7 10*3/uL (ref 1.4–6.5)
Neutrophils Relative %: 52 %
PLATELETS: 197 10*3/uL (ref 150–440)
RBC: 4.34 MIL/uL — AB (ref 4.40–5.90)
RDW: 13.1 % (ref 11.5–14.5)
WBC: 7.2 10*3/uL (ref 3.8–10.6)

## 2016-09-25 LAB — TRIGLYCERIDES
TRIGLYCERIDES: 915 mg/dL — AB (ref <150)
Triglycerides: 854 mg/dL — ABNORMAL HIGH (ref <150)

## 2016-09-25 LAB — GLUCOSE, CAPILLARY: Glucose-Capillary: 69 mg/dL (ref 65–99)

## 2016-09-25 MED ORDER — MAGNESIUM OXIDE 400 (241.3 MG) MG PO TABS
400.0000 mg | ORAL_TABLET | Freq: Once | ORAL | Status: AC
  Administered 2016-09-25: 400 mg via ORAL
  Filled 2016-09-25: qty 1

## 2016-09-25 MED ORDER — SODIUM CHLORIDE 0.9% FLUSH: 10.0000 mL | INTRAVENOUS | Status: DC | PRN

## 2016-09-25 MED ORDER — SODIUM CHLORIDE 0.9% FLUSH
10.0000 mL | Freq: Two times a day (BID) | INTRAVENOUS | Status: DC
  Administered 2016-09-25 – 2016-09-30 (×9): 10 mL

## 2016-09-25 MED ORDER — POTASSIUM CHLORIDE CRYS ER 20 MEQ PO TBCR
40.0000 meq | EXTENDED_RELEASE_TABLET | Freq: Once | ORAL | Status: AC
  Administered 2016-09-25: 40 meq via ORAL
  Filled 2016-09-25: qty 2

## 2016-09-25 MED ORDER — GEMFIBROZIL 600 MG PO TABS
600.0000 mg | ORAL_TABLET | Freq: Two times a day (BID) | ORAL | Status: DC
  Administered 2016-09-25 – 2016-09-29 (×9): 600 mg via ORAL
  Filled 2016-09-25 (×9): qty 1

## 2016-09-25 MED ORDER — MAGNESIUM SULFATE 2 GM/50ML IV SOLN: 2.0000 g | Freq: Once | INTRAVENOUS | Status: DC

## 2016-09-25 MED ORDER — MAGNESIUM SULFATE 2 GM/50ML IV SOLN
2.0000 g | Freq: Once | INTRAVENOUS | Status: AC
Start: 2016-09-25 — End: 2016-09-25
  Administered 2016-09-25: 2 g via INTRAVENOUS
  Filled 2016-09-25: qty 50

## 2016-09-25 MED ORDER — OXYCODONE HCL 5 MG PO TABS
10.0000 mg | ORAL_TABLET | ORAL | Status: DC | PRN
  Administered 2016-09-25 – 2016-09-30 (×26): 10 mg via ORAL
  Filled 2016-09-25 (×27): qty 2

## 2016-09-25 MED ORDER — SODIUM CHLORIDE 0.9 % IV SOLN
30.0000 meq | Freq: Once | INTRAVENOUS | Status: DC
  Filled 2016-09-25: qty 15

## 2016-09-25 NOTE — Progress Notes (Signed)
OK to place a PICC line per Dr Amado CoeGouru.  Patient has one PIV and is refusing to be stuck for another to administer non compatible infusions.  He is also experiencing multiple venipunctures by lab daily

## 2016-09-25 NOTE — Progress Notes (Signed)
PHARMACY - CRITICAL CARE PROGRESS NOTE  Pharmacy Consult for Electrolyte Monitoring              No Known Allergies  Patient Measurements: Height: 6\' 3"  (190.5 cm) Weight: 248 lb 7.3 oz (112.7 kg) IBW/kg (Calculated) : 84.5   Vital Signs: Temp: 98.1 F (36.7 C) (03/27 2000) Temp Source: Axillary (03/27 2000) BP: 146/93 (03/27 2035) Pulse Rate: 69 (03/27 2035) Intake/Output from previous day: 03/26 0701 - 03/27 0700 In: 2131.4 [I.V.:2131.4] Out: 2640 [Urine:2640] Intake/Output from this shift: Total I/O In: 462 [P.O.:240; I.V.:222] Out: 300 [Urine:300] Vent settings for last 24 hours:  Labs:  Recent Labs (last 2 labs)    Recent Labs  09/22/16 0420 09/23/16 0534 09/23/16 1548 09/24/16 0841 09/24/16 1236 09/24/16 1547 09/24/16 1943  WBC 8.6 6.8  --   --   --   --   --   HGB 12.5* 13.3  --   --   --   --   --   HCT 35.3* 38.1*  --   --   --   --   --   PLT 172 176  --   --   --   --   --   CREATININE 0.52* 0.59* 0.57* 0.66 0.68 0.47* 0.63  MG  --   --   --   --   --   --  1.7  ALBUMIN 2.9* 3.3* 3.6 3.2*  --   --   --   PROT 6.3* 6.9 7.6 6.9  --   --   --   AST 57* 60* 69* 63*  --   --   --   ALT 51 62 71* 65*  --   --   --   ALKPHOS 71 91 96 82  --   --   --   BILITOT 0.7 1.0 1.1 1.0  --   --   --      Estimated Creatinine Clearance: 159.7 mL/min (by C-G formula based on SCr of 0.63 mg/dL).   Recent Labs (last 2 labs)    Recent Labs  09/24/16 1437 09/24/16 1811 09/24/16 2055  GLUCAP 161* 147* 132*      Microbiology:        Recent Results (from the past 720 hour(s))  Culture, blood (routine x 2)     Status: None (Preliminary result)   Collection Time: 09/20/16  6:24 PM  Result Value Ref Range Status   Specimen Description BLOOD RIGHT AC  Final   Special Requests   Final    BOTTLES DRAWN AEROBIC AND ANAEROBIC Blood Culture adequate volume   Culture NO GROWTH 4 DAYS  Final   Report Status PENDING  Incomplete  Culture,  blood (routine x 2)     Status: None (Preliminary result)   Collection Time: 09/20/16  6:24 PM  Result Value Ref Range Status   Specimen Description BLOOD RIGHT HAND  Final   Special Requests   Final    BOTTLES DRAWN AEROBIC AND ANAEROBIC Blood Culture adequate volume   Culture NO GROWTH 4 DAYS  Final   Report Status PENDING  Incomplete  MRSA PCR Screening     Status: None   Collection Time: 09/21/16 10:44 AM  Result Value Ref Range Status   MRSA by PCR NEGATIVE NEGATIVE Final    Comment:        The GeneXpert MRSA Assay (FDA approved for NASAL specimens only), is one component of a comprehensive MRSA colonization surveillance program. It  is not intended to diagnose MRSA infection nor to guide or monitor treatment for MRSA infections.     Assessment: 46 y/o Hart with pancreatitis due to hypertriglyceridemia on insulin drip.   Plan:  Will replace K orally with scheduled BMET q 4 hours as MD does not want K running in IVF at increased rate if necessary for hypoglycemia.  KCl 40 meq po once and f/u next BMET.  3/27 @ 20:00 :   K = 3.5, Mag = 1.7 .   No electrolyte supplementation needed at this time.   3/28 Gave KCl 40 mEq PO x 1 and MgOx 400 mg PO x 1, K 3.7, Mg 1.7. No further supplementation needed at this time.  Thank you for this consult.  Thomasene Ripple, PharmD, BCPS Clinical Pharmacist 09/25/2016

## 2016-09-25 NOTE — Consult Note (Signed)
Cardiology Consultation Note  Patient ID: Donald Hart, MRN: [id number], DOB/AGE: 05-Jul-1970 46 y.o. Admit date: 09/20/2016   Date of Consult: 09/25/2016 Primary Physician: [name], MD Primary Cardiologist: New to [facility] - consult by End Requesting Physician: Dr. Amado Coe, MD  Chief Complaint: Abdominal pain Reason for Consult: Hypertriglyceridemia   HPI: 46 y.o. male with h/o chronic pancreatitis with prior triglyceride level 6751 in August 2017 during hospital admission at [facility] requiring plasmapheresis and insulin drip, complicated by distributive shock and respiratory failure requiring intubation, complicated by MRSA pneumonia and bacteremia, AKI and deconditioning, history of alcohol abuse at 12 beers per day and reports no alcohol in the past several weeks, insulin-dependent diabetes mellitus with diabetic neuropathy, chronic back pain, hypertension, GERD, gout, and overweight who presented to [facility] on 3/23 with abdominal pain and was found to have acute on chronic pancreatitis in the setting of hypertriglyceridemia. Cardiology is asked to evaluate patient's elevated triglyceride level.  Patient has never seen a cardiologist before. Prior echocardiogram in August/2017 in the setting of the above admission was a limited study to evaluate for valvular vegetations which showed no apparent vegetations along with grade 1 diastolic dysfunction and mild LVH. Ejection fraction 60-65%. Patient reports she has had episodes of pancreatitis approximately 4 times previously, most recently it appears he was admitted to [facility] and 01/2016 as above. He has previously been followed by endocrinology for both his insulin-dependent diabetes as well as his hypertriglyceridemia. Glucometer readings from most recent endocrinology visit show a range from 129-239 without episodes of hypoglycemia. Patient denies any family history of elevated lipids or triglycerides. He also denies any family history significant  for heart disease. Patient was in his usual state of health until Friday, 3/23 when he woke up with abdominal pain. He continued on with work no pain progressively worsened prompting him to present to [facility] ED for further evaluation. He apparently had 1 episode of non-bloody, nonbilious emesis per H&P. There was some associated chest pain and shortness of breath. No history of exertional chest pain. Upon the patient's arrival to [facility] they were found to have blood pressure 130/74, heart rate 67 bpm, afebrile, oxygen saturation 98% on room air. CT abdomen pelvis showed soft tissue stranding around and low density within the pancreatic head similar to prior studies felt to be the sequela of previous pancreatitis, fatty liver disease with stable hyperdense hepatic lesions, inflammatory lung changes, aortic and coronary artery atherosclerosis. Abdominal ultrasound showed no cholelithiasis or sonographic evidence for acute cholecystitis, incidental finding of liver nodule. Lipase peaked at 62, coinciding with peak triglyceride at 3809 (admission triglyceride 870 the previous day), with downward trend of triglyceride 2 current level of 915 at time of cardiology consult. Troponin negative 2, renal function normal, potassium 3.6 at time of cardiology consult ECG as below, CXR showed possible mild interstitial edema. Patient has been managed with insulin, gemfibrozil, and Lovaza. He continues to note "severe" abdominal pain. He appears to be more interested in his television program then discussing his care with cardiology.  Past Medical History:  Diagnosis Date  . Acute pancreatitis 07/2015   hospitalization at [facility]  . Alcohol abuse   . Chronic back pain   . Family history of diabetes mellitus    sister  . Family history of heart attack    Father.   . Family history of stomach cancer   . Gout   . Hypertension   . Hypertriglyceridemia   . Type 2 diabetes mellitus (HCC) 07/2015  Most Recent Cardiac  Studies: As above   Surgical History:  Past Surgical History:  Procedure Laterality Date  . NO PAST SURGERIES       Home Meds: Prior to Admission medications   Medication Sig Start Date End Date Taking? Authorizing Provider  albuterol (PROVENTIL HFA;VENTOLIN HFA) 108 (90 Base) MCG/ACT inhaler Inhale 2 puffs into the lungs every 6 (six) hours as needed for wheezing or shortness of breath. 11/18/15  Yes Emily Filbert, MD  allopurinol (ZYLOPRIM) 100 MG tablet Take 2 tablets by mouth daily. 05/17/15  Yes Historical Provider, MD  amitriptyline (ELAVIL) 25 MG tablet Take 25 mg by mouth at bedtime.   Yes Historical Provider, MD  amLODipine (NORVASC) 10 MG tablet Take 1 tablet by mouth daily. 05/17/15  Yes Historical Provider, MD  atorvastatin (LIPITOR) 40 MG tablet Take 40 mg by mouth daily. Reported on 07/21/2015 07/12/15 09/20/16 Yes Historical Provider, MD  carvedilol (COREG) 6.25 MG tablet Take 1 tablet by mouth 2 (two) times daily. 05/22/15  Yes Historical Provider, MD  colchicine 0.6 MG tablet Take 0.6 mg by mouth daily as needed.   Yes Historical Provider, MD  diclofenac sodium (VOLTAREN) 1 % GEL Apply 2 g topically 4 (four) times daily.   Yes Historical Provider, MD  dicyclomine (BENTYL) 10 MG capsule Take 10 mg by mouth 4 (four) times daily as needed for spasms.   Yes Historical Provider, MD  folic acid (FOLVITE) 1 MG tablet Take 1 mg by mouth daily.   Yes Historical Provider, MD  gabapentin (NEURONTIN) 300 MG capsule Take 300 mg by mouth 3 (three) times daily.   Yes Historical Provider, MD  gemfibrozil (LOPID) 600 MG tablet Take 1 tablet (600 mg total) by mouth 2 (two) times daily before a meal. 07/09/15  Yes Altamese Dilling, MD  ibuprofen (ADVIL,MOTRIN) 200 MG tablet Take 800 mg by mouth every 8 (eight) hours as needed.    Yes Historical Provider, MD  insulin NPH Human (HUMULIN N,NOVOLIN N) 100 UNIT/ML injection Inject 14 Units into the skin at bedtime. Reported on 07/21/2015  07/17/15 09/20/16 Yes Historical Provider, MD  insulin regular (NOVOLIN R,HUMULIN R) 250 units/2.62mL (100 units/mL) injection Inject 14 Units into the skin 3 (three) times daily before meals.   Yes Historical Provider, MD  losartan (COZAAR) 100 MG tablet Take 100 mg by mouth daily.   Yes Historical Provider, MD  magnesium oxide (MAG-OX) 400 MG tablet Take 400 mg by mouth daily.   Yes Historical Provider, MD  Melatonin 3 MG TABS Take 6 mg by mouth at bedtime.   Yes Historical Provider, MD  omega-3 acid ethyl esters (LOVAZA) 1 g capsule Take 2 g by mouth 2 (two) times daily.   Yes Historical Provider, MD  pantoprazole (PROTONIX) 40 MG tablet Take 40 mg by mouth daily.   Yes Historical Provider, MD  clindamycin (CLEOCIN) 300 MG capsule Take 1 capsule (300 mg total) by mouth 3 (three) times daily. Patient not taking: Reported on 09/20/2016 09/04/16   Emily Filbert, MD  guaiFENesin-dextromethorphan [facility] DM) 100-10 MG/5ML syrup Take 10 mLs by mouth every 6 (six) hours as needed for cough. Patient not taking: Reported on 09/04/2016 07/15/16   Ramonita Lab, MD  menthol-cetylpyridinium (CEPACOL) 3 MG lozenge Take 1 lozenge (3 mg total) by mouth as needed for sore throat. Patient not taking: Reported on 09/20/2016 07/15/16   Ramonita Lab, MD  methylPREDNISolone (MEDROL) 4 MG TBPK tablet Dispense Medrol Dosepak as directed Patient not taking: Reported on  09/20/2016 09/04/16   Emily Filbert, MD  oxyCODONE-acetaminophen (PERCOCET) 5-325 MG tablet Take 2 tablets by mouth every 6 (six) hours as needed for moderate pain or severe pain. Patient not taking: Reported on 09/20/2016 09/04/16   Emily Filbert, MD    Inpatient Medications:  . allopurinol  200 mg Oral Daily  . amitriptyline  25 mg Oral QHS  . amLODipine  10 mg Oral Daily  . atorvastatin  40 mg Oral Daily  . carvedilol  6.25 mg Oral BID WC  . docusate sodium  100 mg Oral BID  . enoxaparin (LOVENOX) injection  40 mg Subcutaneous Q24H  . folic  acid  1 mg Oral Daily  . gabapentin  300 mg Oral TID  . gemfibrozil  600 mg Oral BID AC  . losartan  100 mg Oral Daily  . magnesium oxide  400 mg Oral Daily  . mouth rinse  15 mL Mouth Rinse BID  . Melatonin  5 mg Oral QHS  . multivitamin with minerals  1 tablet Oral Daily  . nicotine  21 mg Transdermal Daily  . omega-3 acid ethyl esters  2 g Oral BID  . pantoprazole  40 mg Oral Daily  . sodium chloride flush  10-40 mL Intracatheter Q12H  . sodium chloride flush  3 mL Intravenous Q12H  . thiamine  100 mg Oral Daily   . dextrose 5 % and 0.9% NaCl 100 mL/hr at 09/25/16 1636  . insulin (NOVOLIN-R) infusion 11 mL/hr at 09/25/16 1600    Allergies: No Known Allergies  Social History   Social History  . Marital status: Married    Spouse name: N/A  . Number of children: N/A  . Years of education: N/A   Occupational History  . Not on file.   Social History Main Topics  . Smoking status: Current Every Day Smoker    Packs/day: 1.00    Years: 20.00    Types: Cigarettes  . Smokeless tobacco: Never Used  . Alcohol use 100.8 oz/week    12 Cans of beer per week     Comment: none since January hospitalization  . Drug use: No  . Sexual activity: Yes    Partners: Male    Birth control/ protection: None   Other Topics Concern  . Not on file   Social History Narrative   ** Merged History Encounter **         Family History  Problem Relation Age of Onset  . Adopted: Yes  . Heart attack Father   . Stomach cancer Father   . Diabetes Father   . Diabetes Sister   . Diabetes Sister      Review of Systems: Review of Systems  Constitutional: Positive for malaise/fatigue. Negative for chills, diaphoresis, fever and weight loss.  HENT: Negative for congestion.   Eyes: Negative for discharge and redness.  Respiratory: Positive for shortness of breath. Negative for cough, hemoptysis, sputum production and wheezing.   Cardiovascular: Positive for chest pain and orthopnea. Negative  for palpitations, claudication, leg swelling and PND.  Gastrointestinal: Positive for abdominal pain, nausea and vomiting. Negative for blood in stool, constipation, diarrhea, heartburn and melena.  Genitourinary: Negative for hematuria.  Musculoskeletal: Negative for falls and myalgias.  Skin: Negative for rash.  Neurological: Positive for weakness. Negative for dizziness, tingling, tremors, sensory change, speech change, focal weakness and loss of consciousness.  Endo/Heme/Allergies: Does not bruise/bleed easily.  Psychiatric/Behavioral: Negative for substance abuse. The patient is not nervous/anxious.  All other systems reviewed and are negative.   Labs: No results for input(s): CKTOTAL, CKMB, TROPONINI in the last 72 hours. Lab Results  Component Value Date   WBC 7.2 09/25/2016   HGB 13.9 09/25/2016   HCT 39.8 (L) 09/25/2016   MCV 91.8 09/25/2016   PLT 197 09/25/2016    Recent Labs Lab 09/25/16 0519  09/25/16 1238  NA 138  < > 136  K 3.7  < > 3.6  CL 103  < > 102  CO2 30  < > 27  BUN 6  < > 6  CREATININE 0.56*  < > 0.58*  CALCIUM 9.2  < > 9.0  PROT 7.2  --   --   BILITOT 0.6  --   --   ALKPHOS 84  --   --   ALT 57  --   --   AST 42*  --   --   GLUCOSE 62*  < > 170*  < > = values in this interval not displayed. Lab Results  Component Value Date   CHOL 133 09/20/2016   HDL 16 (L) 09/20/2016   LDLCALC UNABLE TO CALCULATE IF TRIGLYCERIDE OVER 400 mg/dL 40/98/1191   TRIG 478 (H) 09/25/2016   No results found for: DDIMER  Radiology/Studies:  Dg Chest 2 View  Result Date: 09/20/2016 CLINICAL DATA:  Smoker, chest pain, history of pancreatitis EXAM: CHEST  2 VIEW COMPARISON:  07/02/2016 FINDINGS: Cardiomediastinal silhouette is stable. No segmental infiltrate or focal consolidation. Subtle reticular mild interstitial prominence bilaterally. Mild interstitial edema or pneumonitis cannot be excluded. Clinical correlation is necessary. IMPRESSION: No segmental infiltrate  or focal consolidation. Subtle reticular mild interstitial prominence bilaterally. Mild interstitial edema or pneumonitis cannot be excluded. Clinical correlation is necessary. Electronically Signed   By: Natasha Mead M.D.   On: 09/20/2016 13:00   Ct Abdomen Pelvis W Contrast  Result Date: 09/20/2016 CLINICAL DATA:  Central stabbing chest pain for 3 hours. Low abdominal pain. [name] and dizziness. History of diabetes, renal failure and pancreatitis. EXAM: CT ABDOMEN AND PELVIS WITH CONTRAST TECHNIQUE: Multidetector CT imaging of the abdomen and pelvis was performed using the standard protocol following bolus administration of intravenous contrast. CONTRAST:  ISOVUE-300 IOPAMIDOL (ISOVUE-300) INJECTION 61% COMPARISON:  Ultrasound same date. CT 07/12/2016 and MRI 08/20/2016. FINDINGS: Lower chest: There is focal right perihilar upper lobe ground-glass density on image number 1, measuring 19 mm. This is incompletely visualized and was not imaged on the prior study. The lung bases are otherwise clear. There is no pleural or pericardial effusion. Coronary artery atherosclerosis noted. Hepatobiliary: Diffuse hepatic steatosis. There is relative sparing around the gallbladder. Scattered small foci of rounded increased density in the liver similar to previous MRI. No new or enlarging liver lesions are seen. No evidence of gallstones, gallbladder wall thickening or biliary dilatation. Pancreas: 11 mm low-density lesion in the pancreatic head on image 40 is similar to recent MRI per there is soft tissue stranding within the retroperitoneal fat surrounding the pancreatic head. There is no focal fluid collection or ductal dilatation. Spleen: Normal in size without focal abnormality. Adrenals/Urinary Tract: Both adrenal glands appear normal. Stable oblong cyst in the interpolar region of the right kidney. No evidence of enhancing renal mass, urinary tract calculus or hydronephrosis. The bladder appears  normal. Stomach/Bowel: No evidence of bowel wall thickening, distention or surrounding inflammatory change. The appendix appears normal. Vascular/Lymphatic: Stable probable reactive nodes in the porta hepatis. No retroperitoneal lymphadenopathy. Aortic and branch  vessel atherosclerosis. No acute vascular findings. Reproductive: The prostate gland and seminal vesicles appear unremarkable. Other: Stable prominent fat in both inguinal canals. Of the anterior abdominal wall otherwise appears normal. No generalized ascites. Musculoskeletal: No acute or significant osseous findings. IMPRESSION: 1. No definite acute findings or explanation for low abdominal pain. 2. Soft tissue stranding around and low-density within the pancreatic head appear similar to recent prior studies and is probably the sequela of previous pancreatitis in this patient without current serum lipase elevation. 3. Hepatic steatosis with grossly stable hyperdense hepatic lesions. These were evaluated by MRI 1 month ago. Please refer to follow up recommendations. 4. Focal ground-glass pulmonary density in the right upper lobe, not previously imaged. This is likely inflammatory. 5. Aortic and coronary artery atherosclerosis. Electronically Signed   By: Carey BullocksWilliam  Veazey M.D.   On: 09/20/2016 20:11   [place] Abdomen Limited Ruq  Result Date: 09/20/2016 CLINICAL DATA:  Patient with epigastric pain. EXAM: US ABDOMEN LIMITED - RIGHT UPPER QUADRANT COMPARISON:  Abdominal of are 08/20/2016 FINDINGS: Gallbladder: The gallbladder is mildly distended. No gallbladder wall thickening or pericholecystic fluid. Negative sonographic Murphy sign. Common bile duct: Diameter: 4 mm Liver: Diffusely increased in echogenicity. Suggestion of a 2.3 x 1.6 x 2.0 cm hypoechoic nodule within the right hepatic lobe. IMPRESSION: No cholelithiasis or sonographic evidence for acute cholecystitis. Gallbladder is mildly distended, nonspecific. Increased hepatic parenchymal echogenicity  compatible with steatosis. Suggestion of a hypoechoic nodule (2.3 cm) within the right hepatic lobe. It is unclear if this represents a true hepatic nodule or fatty sparing. Patient had recent abdominal MRI 08/20/2016 which demonstrated multiple small hepatic lesions. Recommend follow-up as per prior MRI with additional hepatic MRI 01/2017. Electronically Signed   By: Annia Beltrew  Davis M.D.   On: 09/20/2016 19:03    EKG: Interpreted by me showed: NSR< 99 bpm, poor R wave progression, no acute st/t changes Telemetry: Interpreted by me showed: Sinus rhythm  Weights: Filed Weights   09/23/16 0426 09/24/16 0500 09/25/16 0500  Weight: 251 lb 12.3 oz (114.2 kg) 248 lb 7.3 oz (112.7 kg) 249 lb 12.5 oz (113.3 kg)     Physical Exam: Blood pressure (!) 178/107, pulse 94, temperature 98 F (36.7 C), temperature source Axillary, resp. rate 13, height 6\' 3"  (1.905 m), weight 249 lb 12.5 oz (113.3 kg), SpO2 99 %. Body mass index is 31.22 kg/m. General: Well developed, well nourished, in no acute distress. Head: Normocephalic, atraumatic, sclera non-icteric, no xanthomas, nares are without discharge.  Neck: Negative for carotid bruits. JVD not elevated. Lungs: Clear bilaterally to auscultation without wheezes, rales, or rhonchi. Breathing is unlabored. Heart: RRR with S1 S2. No murmurs, rubs, or gallops appreciated. Abdomen: Obese, soft, diffusely tender to palpation though appeared more tender along RUQ, non-distended with normoactive bowel sounds. No hepatomegaly. No rebound/guarding. No obvious abdominal masses. Msk:  Strength and tone appear normal for age. Extremities: No clubbing or cyanosis. No edema. Distal pedal pulses are 2+ and equal bilaterally. Neuro: Alert and oriented X 3. No facial asymmetry. No focal deficit. Moves all extremities spontaneously. Psych:  Responds to questions appropriately with a normal affect.    Assessment and Plan:  Principal Problem:   Acute on chronic pancreatitis  (HCC) Active Problems:   Insulin dependent diabetes mellitus (HCC)   Alcohol abuse   Hypertriglyceridemia   Liver lesion   Polysubstance abuse   Atypical chest pain    1. Acute on chronic pancreatitis: -Given the patient's triglyceride level is improving on current  therapy including gemfibrozil, insulin, and Lovaza would continue current medical therapy for now -Will defer any decisions regarding plasmapheresis to primary team and/or patient's endocrinologist given patient has previously required this therapy during prolonged hospital admission at [facility] in August 2017 -Patient may require intermittent outpatient plasmapheresis in the future, though would defer this decision to his primary endocrinologist -No further recommendations at this time  2. Hypertriglyceridemia: -As above  3. Atypical chest pain/SOB/coronary atherosclerosis: -Negative troponin 2 -Symptoms appear mostly atypical in nature, though patient does have significant coronary artery disease risk factors including hypertension triglyceridemia, tobacco abuse, obesity, hypertension, diabetes -No indication to repeat echocardiogram at this time -Defer ischemic evaluation until his acute illness has resolved, this could be performed in the outpatient setting  4. Fatty liver disease: -See radiology report for recommendations on follow-up imaging given hyperdense lesion noted and liver -Per IM  5. Polysubstance abuse: -Cessation of tobacco recommended -Patient reports no EtOH in the past several weeks  6. Obesity: -Weight loss recommended  7. Insulin-dependent diabetes with diabetic neuropathy: -Followed by endocrinology, recommend aggressive treatment -A1c pending at this time  8. Chronic pain: -Per IM    Signed, Eula Listen, PA-C [facility] Pager: [phone] 09/25/2016, 4:43 PM

## 2016-09-25 NOTE — Progress Notes (Signed)
PHARMACY - CRITICAL CARE PROGRESS NOTE  Pharmacy Consult for Electrolyte Monitoring              No Known Allergies  Patient Measurements: Height: 6\' 3"  (190.5 cm) Weight: 248 lb 7.3 oz (112.7 kg) IBW/kg (Calculated) : 84.5  Vital Signs: Temp: 98.1 F (36.7 C) (03/27 2000) Temp Source: Axillary (03/27 2000) BP: 146/93 (03/27 2035) Pulse Rate: 69 (03/27 2035) Intake/Output from previous day: 03/26 0701 - 03/27 0700 In: 2131.4 [I.V.:2131.4] Out: 2640 [Urine:2640] Intake/Output from this shift: Total I/O In: 462 [P.O.:240; I.V.:222] Out: 300 [Urine:300] Vent settings for last 24 hours:  Labs:  Recent Labs (last 2 labs)    Recent Labs  09/22/16 0420 09/23/16 0534 09/23/16 1548 09/24/16 0841 09/24/16 1236 09/24/16 1547 09/24/16 1943  WBC 8.6 6.8  --   --   --   --   --   HGB 12.5* 13.3  --   --   --   --   --   HCT 35.3* 38.1*  --   --   --   --   --   PLT 172 176  --   --   --   --   --   CREATININE 0.52* 0.59* 0.57* 0.66 0.68 0.47* 0.63  MG  --   --   --   --   --   --  1.7  ALBUMIN 2.9* 3.3* 3.6 3.2*  --   --   --   PROT 6.3* 6.9 7.6 6.9  --   --   --   AST 57* 60* 69* 63*  --   --   --   ALT 51 62 71* 65*  --   --   --   ALKPHOS 71 91 96 82  --   --   --   BILITOT 0.7 1.0 1.1 1.0  --   --   --      Estimated Creatinine Clearance: 159.7 mL/min (by C-G formula based on SCr of 0.63 mg/dL).   Recent Labs (last 2 labs)    Recent Labs  09/24/16 1437 09/24/16 1811 09/24/16 2055  GLUCAP 161* 147* 132*      Assessment: 46 y/o M with pancreatitis due to hypertriglyceridemia on insulin drip.   Plan:  K @ 1637 = 3.6. Patient received KCl 40 mEq PO at 1630 this afternoon. No additional supplementation needed at this time.  Will continue to follow BMETs q4h and repalce electrolytes PRN while on insulin drip.   Cindi CarbonMary M Teila Skalsky, PharmD 09/25/16 5:48 PM

## 2016-09-25 NOTE — Progress Notes (Signed)
[facility] Physicians - [place] at [facility]   PATIENT NAME: Donald Hart    MR#:  [id number]  DATE OF BIRTH:  [birth year]  SUBJECTIVE:  CHIEF COMPLAINT:  Patient Is feeling better, Tolerating clear liquid diet  REVIEW OF SYSTEMS:  CONSTITUTIONAL: No fever, fatigue or weakness.  [name], NOSE, [name], shortness of breath, [name], orthopnea, [name], [name], nocturia,  [name], [name], numbness, weakness.  PSYCHIATRY: No anxiety or depression.   DRUG ALLERGIES:  No Known Allergies  VITALS:  Blood pressure (!) 144/94, pulse 91, temperature 98 F (36.7 C), temperature source Axillary, resp. rate 13, height 6\' 3"  (1.905 m), weight 113.3 kg (249 lb 12.5 oz), SpO2 99 %.  PHYSICAL EXAMINATION:  GENERAL:  46 y.o.-year-old patient lying in the bed with no acute distress.  EYES: Pupils equal, round, [name], normocephalic. Oropharynx and nasopharynx clear.  NECK:  Supple, [name], [name], no wheezing, rales,[name], [name], rubs, [name], [name], nondistended. Bowel sounds present. No organomegaly  EXTREMITIES: No pedal edema, cyanosis, or clubbing.  NEUROLOGIC:She Cranial nerves II through XII are intact. Muscle strength 5/5 in all  extremities. Sensation intact. Gait not checked.  PSYCHIATRIC: The patient is alert and oriented x 3.  SKIN: No obvious rash, lesion, or ulcer.    LABORATORY PANEL:   CBC  Recent Labs Lab 09/25/16 0519  WBC 7.2  HGB 13.9  HCT 39.8*  PLT 197   ------------------------------------------------------------------------------------------------------------------  Chemistries   Recent Labs Lab 09/24/16 1943  09/25/16 0519  09/25/16 1238  NA 136  < > 138  < > 136  K 3.5  < > 3.7  < > 3.6  CL 102  < > 103  < > 102  CO2 28  < > 30  < > 27  GLUCOSE 52*  < > 62*  < > 170*  BUN 6  < > 6  < > 6  CREATININE 0.63  < > 0.56*  < > 0.58*  CALCIUM 9.2  < > 9.2  < > 9.0  MG 1.7  --   --   --   --   AST  --   --  42*  --   --   ALT  --   --  57  --   --   ALKPHOS  --   --  84  --   --   BILITOT  --   --  0.6  --   --   < > = values in this interval not displayed. ------------------------------------------------------------------------------------------------------------------  Cardiac Enzymes  Recent Labs Lab 09/20/16 1613  TROPONINI <0.03   ------------------------------------------------------------------------------------------------------------------  RADIOLOGY:  No results found.  EKG:   Orders placed or performed during the hospital encounter of 09/20/16  . EKG 12-Lead  . EKG 12-Lead  . ED EKG within 10 minutes  . ED EKG within 10 minutes  ASSESSMENT AND PLAN:   [name] admitted for pancreatitis.  1. Pancreatitis: Acute on chronic; secondary to hypertriglyceridemia,Worsened by alcohol abuse not sure if he has any component of genetic predisposition  lipase is normal but pain and etiology of hypertriglyceridemia consistent with pancreatitis.  -Tolerating clear liquid diet , starting full liquid diet  Renal function and calcium are normal.  Continue the patient on insulin drip and we will manage his pain as best as possible.  Bowel rest.    Check a.m. labs   2. Hypertriglyceridemia: Normal total cholesterol. Continue statin therapy as well as gemfibrozil Triglycerides 3800--2349--1490 --1169--915 Continue insulin drip until TG close to 500, serial BMPS while on insulin gtt Continue Lipitor and hold gemfibrozil Cardio consult   3. Hyponatremia: Pseudo-; [name];  continue insulin drip per pharmacy Monitor sugars. Repeat hemoglobin A1c   5. Tobacco abuse: Counseled patient to quit smoking for 5 minutes. Patient is agreeable. on NicoDerm patch  #. History of alcohol abuse - CIWA protocol   DVT prophylaxis: Lovenox . GI prophylaxis: Pantoprazole  PT- no PT needs identified    All the records are reviewed and case discussed with Care Management/Social Workerr. Management plans discussed with the patient,wife over phone agreeable with the plan CODE STATUS: fc   TOTAl critical care TIME TAKING CARE OF THIS PATIENT: 36minutes.   POSSIBLE D/C IN 2-3 DAYS, DEPENDING ON CLINICAL CONDITION.  Note: This dictation was prepared with Dragon dictation along with smaller phrase technology. Any transcriptional errors that result from this process are unintentional.   Ramonita LabGouru, Ilia Engelbert M.D on 09/25/2016 at 3:05 PM  Between 7am to 6pm - Pager - [phone] After 6pm go to [email or web address] - password [email or web address] [facility]  [facility]  Office  [phone]  CC: Primary care physician; Dione Housekeeperlmedo, Mario Ernesto, MD

## 2016-09-25 NOTE — Progress Notes (Addendum)
PHARMACY - CRITICAL CARE PROGRESS NOTE  Pharmacy Consult for Electrolyte Monitoring              No Known Allergies  Patient Measurements: Height: 6\' 3"  (190.5 cm) Weight: 248 lb 7.3 oz (112.7 kg) IBW/kg (Calculated) : 84.5   Vital Signs: Temp: 98.1 F (36.7 C) (03/27 2000) Temp Source: Axillary (03/27 2000) BP: 146/93 (03/27 2035) Pulse Rate: 69 (03/27 2035) Intake/Output from previous day: 03/26 0701 - 03/27 0700 In: 2131.4 [I.V.:2131.4] Out: 2640 [Urine:2640] Intake/Output from this shift: Total I/O In: 462 [P.O.:240; I.V.:222] Out: 300 [Urine:300] Vent settings for last 24 hours:  Labs:  Recent Labs (last 2 labs)    Recent Labs  09/22/16 0420 09/23/16 0534 09/23/16 1548 09/24/16 0841 09/24/16 1236 09/24/16 1547 09/24/16 1943  WBC 8.6 6.8  --   --   --   --   --   HGB 12.5* 13.3  --   --   --   --   --   HCT 35.3* 38.1*  --   --   --   --   --   PLT 172 176  --   --   --   --   --   CREATININE 0.52* 0.59* 0.57* 0.66 0.68 0.47* 0.63  MG  --   --   --   --   --   --  1.7  ALBUMIN 2.9* 3.3* 3.6 3.2*  --   --   --   PROT 6.3* 6.9 7.6 6.9  --   --   --   AST 57* 60* 69* 63*  --   --   --   ALT 51 62 71* 65*  --   --   --   ALKPHOS 71 91 96 82  --   --   --   BILITOT 0.7 1.0 1.1 1.0  --   --   --      Estimated Creatinine Clearance: 159.7 mL/min (by C-G formula based on SCr of 0.63 mg/dL).   Recent Labs (last 2 labs)    Recent Labs  09/24/16 1437 09/24/16 1811 09/24/16 2055  GLUCAP 161* 147* 132*      Microbiology:        Recent Results (from the past 720 hour(s))  Culture, blood (routine x 2)     Status: None (Preliminary result)   Collection Time: 09/20/16  6:24 PM  Result Value Ref Range Status   Specimen Description BLOOD RIGHT AC  Final   Special Requests   Final    BOTTLES DRAWN AEROBIC AND ANAEROBIC Blood Culture adequate volume   Culture NO GROWTH 4 DAYS  Final   Report Status PENDING  Incomplete  Culture,  blood (routine x 2)     Status: None (Preliminary result)   Collection Time: 09/20/16  6:24 PM  Result Value Ref Range Status   Specimen Description BLOOD RIGHT HAND  Final   Special Requests   Final    BOTTLES DRAWN AEROBIC AND ANAEROBIC Blood Culture adequate volume   Culture NO GROWTH 4 DAYS  Final   Report Status PENDING  Incomplete  MRSA PCR Screening     Status: None   Collection Time: 09/21/16 10:44 AM  Result Value Ref Range Status   MRSA by PCR NEGATIVE NEGATIVE Final    Comment:        The GeneXpert MRSA Assay (FDA approved for NASAL specimens only), is one component of a comprehensive MRSA colonization surveillance program. It  is not intended to diagnose MRSA infection nor to guide or monitor treatment for MRSA infections.     Assessment: 46 y/o M with pancreatitis due to hypertriglyceridemia on insulin drip.   Plan:  Replaced magnesium with mag sulfate 2 g iv once in addition to oral supplementation. Will continue to follow BMETs and repalce electrolytes PRN while on insulin drip.   Donald Hart, PharmD Clinical Pharmacist  09/25/2016   Addendum:   KCl 40 meq po once and continue to follow serial BMETs. Next mg in AM.  Donald Hart, PharmD Clinical Pharmacist

## 2016-09-25 NOTE — Progress Notes (Signed)
PHARMACY - CRITICAL CARE PROGRESS NOTE  Pharmacy Consult for Electrolyte Monitoring              No Known Allergies  Patient Measurements: Height: 6\' 3"  (190.5 cm) Weight: 248 lb 7.3 oz (112.7 kg) IBW/kg (Calculated) : 84.5  Vital Signs: Temp: 98.1 F (36.7 C) (03/27 2000) Temp Source: Axillary (03/27 2000) BP: 146/93 (03/27 2035) Pulse Rate: 69 (03/27 2035) Intake/Output from previous day: 03/26 0701 - 03/27 0700 In: 2131.4 [I.V.:2131.4] Out: 2640 [Urine:2640] Intake/Output from this shift: Total I/O In: 462 [P.O.:240; I.V.:222] Out: 300 [Urine:300] Vent settings for last 24 hours:  Labs:  Recent Labs (last 2 labs)    Recent Labs  09/22/16 0420 09/23/16 0534 09/23/16 1548 09/24/16 0841 09/24/16 1236 09/24/16 1547 09/24/16 1943  WBC 8.6 6.8  --   --   --   --   --   HGB 12.5* 13.3  --   --   --   --   --   HCT 35.3* 38.1*  --   --   --   --   --   PLT 172 176  --   --   --   --   --   CREATININE 0.52* 0.59* 0.57* 0.66 0.68 0.47* 0.63  MG  --   --   --   --   --   --  1.7  ALBUMIN 2.9* 3.3* 3.6 3.2*  --   --   --   PROT 6.3* 6.9 7.6 6.9  --   --   --   AST 57* 60* 69* 63*  --   --   --   ALT 51 62 71* 65*  --   --   --   ALKPHOS 71 91 96 82  --   --   --   BILITOT 0.7 1.0 1.1 1.0  --   --   --      Estimated Creatinine Clearance: 159.7 mL/min (by C-G formula based on SCr of 0.63 mg/dL).   Recent Labs (last 2 labs)    Recent Labs  09/24/16 1437 09/24/16 1811 09/24/16 2055  GLUCAP 161* 147* 132*      Assessment: 46 y/o M with pancreatitis due to hypertriglyceridemia on insulin drip.   Plan:  K @ 1637 = 3.6. Patient received KCl 40 mEq PO at 1630 this afternoon. No additional supplementation needed at this time.  Will continue to follow BMETs q4h and repalce electrolytes PRN while on insulin drip.   3/28 K @ 20:30 = 3.9.  No need for K supplementation at this time.   Rakiya Krawczyk D, PharmD 09/25/16 9:58 PM

## 2016-09-26 LAB — BASIC METABOLIC PANEL
ANION GAP: 6 (ref 5–15)
ANION GAP: 6 (ref 5–15)
ANION GAP: 6 (ref 5–15)
ANION GAP: 7 (ref 5–15)
ANION GAP: 7 (ref 5–15)
Anion gap: 5 (ref 5–15)
BUN: 5 mg/dL — AB (ref 6–20)
BUN: 6 mg/dL (ref 6–20)
BUN: 6 mg/dL (ref 6–20)
BUN: 6 mg/dL (ref 6–20)
BUN: 6 mg/dL (ref 6–20)
BUN: 7 mg/dL (ref 6–20)
CALCIUM: 8.7 mg/dL — AB (ref 8.9–10.3)
CALCIUM: 8.9 mg/dL (ref 8.9–10.3)
CALCIUM: 8.9 mg/dL (ref 8.9–10.3)
CALCIUM: 9.2 mg/dL (ref 8.9–10.3)
CHLORIDE: 103 mmol/L (ref 101–111)
CHLORIDE: 104 mmol/L (ref 101–111)
CO2: 25 mmol/L (ref 22–32)
CO2: 25 mmol/L (ref 22–32)
CO2: 26 mmol/L (ref 22–32)
CO2: 28 mmol/L (ref 22–32)
CO2: 28 mmol/L (ref 22–32)
CO2: 29 mmol/L (ref 22–32)
CREATININE: 0.65 mg/dL (ref 0.61–1.24)
CREATININE: 0.66 mg/dL (ref 0.61–1.24)
CREATININE: 0.57 mg/dL — AB (ref 0.61–1.24)
Calcium: 9 mg/dL (ref 8.9–10.3)
Calcium: 9.1 mg/dL (ref 8.9–10.3)
Chloride: 100 mmol/L — ABNORMAL LOW (ref 101–111)
Chloride: 103 mmol/L (ref 101–111)
Chloride: 103 mmol/L (ref 101–111)
Chloride: 103 mmol/L (ref 101–111)
Creatinine, Ser: 0.54 mg/dL — ABNORMAL LOW (ref 0.61–1.24)
Creatinine, Ser: 0.68 mg/dL (ref 0.61–1.24)
Creatinine, Ser: 0.66 mg/dL (ref 0.61–1.24)
GFR calc Af Amer: >60 mL/min (ref >60)
GFR calc Af Amer: >60 mL/min (ref >60)
GFR calc Af Amer: >60 mL/min (ref >60)
GFR calc Af Amer: >60 mL/min (ref >60)
GFR calc Af Amer: >60 mL/min (ref >60)
GFR calc non Af Amer: >60 mL/min (ref >60)
GFR calc non Af Amer: >60 mL/min (ref >60)
GFR calc non Af Amer: >60 mL/min (ref >60)
GFR calc non Af Amer: >60 mL/min (ref >60)
GLUCOSE: 103 mg/dL — AB (ref 65–99)
GLUCOSE: 105 mg/dL — AB (ref 65–99)
GLUCOSE: 159 mg/dL — AB (ref 65–99)
GLUCOSE: 78 mg/dL (ref 65–99)
GLUCOSE: 78 mg/dL (ref 65–99)
GLUCOSE: 90 mg/dL (ref 65–99)
POTASSIUM: 3.8 mmol/L (ref 3.5–5.1)
POTASSIUM: 3.9 mmol/L (ref 3.5–5.1)
POTASSIUM: 4.3 mmol/L (ref 3.5–5.1)
Potassium: 3.6 mmol/L (ref 3.5–5.1)
Potassium: 3.7 mmol/L (ref 3.5–5.1)
Potassium: 3.8 mmol/L (ref 3.5–5.1)
Sodium: 133 mmol/L — ABNORMAL LOW (ref 135–145)
Sodium: 134 mmol/L — ABNORMAL LOW (ref 135–145)
Sodium: 135 mmol/L (ref 135–145)
Sodium: 136 mmol/L (ref 135–145)
Sodium: 138 mmol/L (ref 135–145)
Sodium: 138 mmol/L (ref 135–145)

## 2016-09-26 LAB — TRIGLYCERIDES
Triglycerides: 706 mg/dL — ABNORMAL HIGH (ref <150)
Triglycerides: 741 mg/dL — ABNORMAL HIGH (ref <150)

## 2016-09-26 LAB — MAGNESIUM: Magnesium: 1.9 mg/dL (ref 1.7–2.4)

## 2016-09-26 MED ORDER — INSULIN REGULAR HUMAN 100 UNIT/ML IJ SOLN
INTRAMUSCULAR | Status: DC
  Administered 2016-09-26: 11 [IU]/h via INTRAVENOUS
  Filled 2016-09-26: qty 2.5

## 2016-09-26 MED ORDER — BISACODYL 10 MG RE SUPP: 10.0000 mg | Freq: Every day | RECTAL | Status: DC | PRN

## 2016-09-26 MED ORDER — DEXTROSE 10 % IV SOLN
INTRAVENOUS | Status: DC
  Administered 2016-09-26: 16:00:00 via INTRAVENOUS

## 2016-09-26 MED ORDER — POLYETHYLENE GLYCOL 3350 17 G PO PACK
17.0000 g | PACK | Freq: Every day | ORAL | Status: DC
  Administered 2016-09-26 – 2016-09-30 (×4): 17 g via ORAL
  Filled 2016-09-26 (×4): qty 1

## 2016-09-26 MED ORDER — POTASSIUM CHLORIDE CRYS ER 20 MEQ PO TBCR
40.0000 meq | EXTENDED_RELEASE_TABLET | Freq: Once | ORAL | Status: AC
  Administered 2016-09-26: 40 meq via ORAL
  Filled 2016-09-26: qty 2

## 2016-09-26 NOTE — Progress Notes (Signed)
Pt declined pain medication early in AM, did request it about an hour later.  Advanced diet to soft per MD, pt asked for towels and washcloths, bathed himself and brushed his teeth, writer changed bed and pt is currently sitting in his chair.  He is a little more talkative today and reports he feels better.

## 2016-09-26 NOTE — Care Management (Signed)
Remains  in icu due to need for  insulin drip.  Advancing diet.

## 2016-09-26 NOTE — Progress Notes (Addendum)
Per Dr Amado CoeGouru order miralax and dulcolax supp, start a soft diet

## 2016-09-26 NOTE — Progress Notes (Signed)
PHARMACY - CRITICAL CARE PROGRESS NOTE  Pharmacy Consult for Electrolyte Monitoring              No Known Allergies  Patient Measurements: Height: 6\' 3"  (190.5 cm) Weight: 248 lb 7.3 oz (112.7 kg) IBW/kg (Calculated) : 84.5   Vital Signs: Temp: 98.1 F (36.7 C) (03/27 2000) Temp Source: Axillary (03/27 2000) BP: 146/93 (03/27 2035) Pulse Rate: 69 (03/27 2035) Intake/Output from previous day: 03/26 0701 - 03/27 0700 In: 2131.4 [I.V.:2131.4] Out: 2640 [Urine:2640] Intake/Output from this shift: Total I/O In: 462 [P.O.:240; I.V.:222] Out: 300 [Urine:300] Vent settings for last 24 hours:  Labs:  Recent Labs (last 2 labs)    Recent Labs  09/22/16 0420 09/23/16 0534 09/23/16 1548 09/24/16 0841 09/24/16 1236 09/24/16 1547 09/24/16 1943  WBC 8.6 6.8  --   --   --   --   --   HGB 12.5* 13.3  --   --   --   --   --   HCT 35.3* 38.1*  --   --   --   --   --   PLT 172 176  --   --   --   --   --   CREATININE 0.52* 0.59* 0.57* 0.66 0.68 0.47* 0.63  MG  --   --   --   --   --   --  1.7  ALBUMIN 2.9* 3.3* 3.6 3.2*  --   --   --   PROT 6.3* 6.9 7.6 6.9  --   --   --   AST 57* 60* 69* 63*  --   --   --   ALT 51 62 71* 65*  --   --   --   ALKPHOS 71 91 96 82  --   --   --   BILITOT 0.7 1.0 1.1 1.0  --   --   --      Estimated Creatinine Clearance: 159.7 mL/min (by C-G formula based on SCr of 0.63 mg/dL).  Potassium  Date Value Ref Range Status  09/26/2016 3.6 3.5 - 5.1 mmol/L Final  01/24/2014 3.8 3.5 - 5.1 mmol/L Final     Recent Labs (last 2 labs)    Recent Labs  09/24/16 1437 09/24/16 1811 09/24/16 2055  GLUCAP 161* 147* 132*      Microbiology:        Recent Results (from the past 720 hour(s))  Culture, blood (routine x 2)     Status: None (Preliminary result)   Collection Time: 09/20/16  6:24 PM  Result Value Ref Range Status   Specimen Description BLOOD RIGHT AC  Final   Special Requests   Final    BOTTLES DRAWN AEROBIC AND  ANAEROBIC Blood Culture adequate volume   Culture NO GROWTH 4 DAYS  Final   Report Status PENDING  Incomplete  Culture, blood (routine x 2)     Status: None (Preliminary result)   Collection Time: 09/20/16  6:24 PM  Result Value Ref Range Status   Specimen Description BLOOD RIGHT HAND  Final   Special Requests   Final    BOTTLES DRAWN AEROBIC AND ANAEROBIC Blood Culture adequate volume   Culture NO GROWTH 4 DAYS  Final   Report Status PENDING  Incomplete  MRSA PCR Screening     Status: None   Collection Time: 09/21/16 10:44 AM  Result Value Ref Range Status   MRSA by PCR NEGATIVE NEGATIVE Final    Comment:  The GeneXpert MRSA Assay (FDA approved for NASAL specimens only), is one component of a comprehensive MRSA colonization surveillance program. It is not intended to diagnose MRSA infection nor to guide or monitor treatment for MRSA infections.     Assessment: 46 y/o M with pancreatitis due to hypertriglyceridemia on insulin drip.   Plan:  KCl 40 meq po once. Will continue to follow electrolytes while no insulin drip and replace as necessary.   Luisa Hart, PharmD Clinical Pharmacist

## 2016-09-26 NOTE — Progress Notes (Addendum)
[facility] Physicians - [place] at [facility]   PATIENT NAME: Donald Hart    MR#:  [id number]  DATE OF BIRTH:  [birth year]  SUBJECTIVE:  CHIEF COMPLAINT:  Patient Is feeling better, Tolerating Full liquids will advance to soft. Constipated Abdominal pain is improving  REVIEW OF SYSTEMS:  CONSTITUTIONAL: No fever, fatigue or weakness.  [name], NOSE, [name], shortness of breath, [name], orthopnea, edema.  GASTROINTESTINALImproving abdominal pain No vomiting.  GENITOURINARY: No dysuria, [name], nocturia,  [name], [name], numbness, weakness.  PSYCHIATRY: No anxiety or depression.   DRUG ALLERGIES:  No Known Allergies  VITALS:  Blood pressure 121/90, pulse 64, temperature 98.1 F (36.7 C), temperature source Oral, resp. rate 10, height 6\' 3"  (1.905 m), weight 115 kg (253 lb 8.5 oz), SpO2 97 %.  PHYSICAL EXAMINATION:  GENERAL:  46 y.o.-year-old patient lying in the bed with no acute distress.  EYES: Pupils equal, round, [name], normocephalic. Oropharynx and nasopharynx clear.  NECK:  Supple, [name], [name], no wheezing, rales,[name], [name], rubs, [name], Epigastrium With minimal tenderness no rebound tenderness, nondistended. Bowel sounds present. No organomegaly  EXTREMITIES: No pedal edema, cyanosis, or clubbing.  NEUROLOGIC:She Cranial nerves II through XII are  intact. Muscle strength 5/5 in all extremities. Sensation intact. Gait not checked.  PSYCHIATRIC: The patient is alert and oriented x 3.  SKIN: No obvious rash, lesion, or ulcer.    LABORATORY PANEL:   CBC  Recent Labs Lab 09/25/16 0519  WBC 7.2  HGB 13.9  HCT 39.8*  PLT 197   ------------------------------------------------------------------------------------------------------------------  Chemistries   Recent Labs Lab 09/25/16 0519  09/26/16 0429  09/26/16 1212  NA 138  < > 138  < > 134*  K 3.7  < > 3.7  < > 3.8  CL 103  < > 103  < > 103  CO2 30  < > 28  < > 25  GLUCOSE 62*  < > 78  < > 78  BUN 6  < > 5*  < > 6  CREATININE 0.56*  < > 0.65  < > 0.54*  CALCIUM 9.2  < > 8.9  < > 9.0  MG  --   --  1.9  --   --   AST 42*  --   --   --   --   ALT 57  --   --   --   --   ALKPHOS 84  --   --   --   --   BILITOT 0.6  --   --   --   --   < > = values in this interval not displayed. ------------------------------------------------------------------------------------------------------------------  Cardiac Enzymes  Recent Labs Lab 09/20/16 1613  TROPONINI <0.03   ------------------------------------------------------------------------------------------------------------------  RADIOLOGY:  No results found.  EKG:   Orders placed or performed during the hospital encounter of 09/20/16  . EKG 12-Lead  . EKG 12-Lead  . ED EKG within 10 minutes  .  ED EKG within 10 minutes    ASSESSMENT AND PLAN:   [name] admitted for pancreatitis.  1. Pancreatitis: Acute on chronic; secondary to hypertriglyceridemia,Worsened by alcohol abuse not sure if he has any component of genetic predisposition -Clinically improving  lipase is normal but pain and etiology of hypertriglyceridemia consistent with pancreatitis.  -Tolerating Full liquid diet advance as tolerated 2 diabetic and cardiac diet Renal function and calcium are normal.  Continue the patient on  insulin drip and discontinue when the triglycerides are at 500 and we will manage his pain as best as possible.  Bowel rest.   Check a.m. labs   2. Hypertriglyceridemia: Normal total cholesterol. Continue statin therapy as well as gemfibrozil Triglycerides 3800--2349--1490 --1169--915--706 Continue insulin drip until TG close to 500, serial BMPS while on insulin gtt Continue Lipitor and RESUME gemfibrozil, and omega-3 fatty acids Outpatient follow-up with the patient's primary endocrinologist DR.Abisogun Follow-up with cardiology  3. Hyponatremia: Pseudo-; secondary to elevated lipids and history of alcohol abuse/dehydration Resolved with IV fluids Monitor serial sodiums 135-129--123--136--134   4. Diabetes mellitus type 2: Most recent hemoglobin A1c 7.1;  continue insulin drip per pharmacy Monitor sugars. Repeat hemoglobin A1c   5. Hypertension blood pressure is elevated from the pain Improved now Continue losartan , amlodipine and Coreg and titrate as needed Tolerating diet, decrease IV fluid rate  . Tobacco abuse: Counseled patient to quit smoking for 5 minutes. Patient is agreeable. on NicoDerm patch  #. History of alcohol abuse - CIWA protocol   DVT prophylaxis: Lovenox . GI prophylaxis: Pantoprazole  PT- no PT needs identified    All the records are reviewed and case discussed with Care Management/Social Workerr. Management plans discussed with the patient,wife over phone agreeable with the plan CODE STATUS: fc   TOTAl critical care TIME TAKING CARE OF THIS PATIENT: 36minutes.   POSSIBLE D/C IN 2-3 DAYS, DEPENDING ON CLINICAL CONDITION.  Note: This dictation was prepared with Dragon dictation along with smaller phrase technology. Any transcriptional errors that result from this process are unintentional.   Ramonita LabGouru, Jaxn Chiquito M.D on 09/26/2016 at 3:12 PM  Between 7am to 6pm - Pager - [phone] After 6pm go to [email or web address] - password [email or web address]  [facility]  Office  [phone]  CC: Primary care physician; Dione Housekeeperlmedo, Mario Ernesto, MD

## 2016-09-26 NOTE — Progress Notes (Signed)
Advance diet per Gouru

## 2016-09-26 NOTE — Progress Notes (Signed)
PHARMACY - CRITICAL CARE PROGRESS NOTE  Pharmacy Consult for Electrolyte Monitoring              No Known Allergies  Patient Measurements: Height: 6\' 3"  (190.5 cm) Weight: 248 lb 7.3 oz (112.7 kg) IBW/kg (Calculated) : 84.5  Vital Signs: Temp: 98.1 F (36.7 C) (03/27 2000) Temp Source: Axillary (03/27 2000) BP: 146/93 (03/27 2035) Pulse Rate: 69 (03/27 2035) Intake/Output from previous day: 03/26 0701 - 03/27 0700 In: 2131.4 [I.V.:2131.4] Out: 2640 [Urine:2640] Intake/Output from this shift: Total I/O In: 462 [P.O.:240; I.V.:222] Out: 300 [Urine:300] Vent settings for last 24 hours:  Labs:  Recent Labs (last 2 labs)    Recent Labs  09/22/16 0420 09/23/16 0534 09/23/16 1548 09/24/16 0841 09/24/16 1236 09/24/16 1547 09/24/16 1943  WBC 8.6 6.8  --   --   --   --   --   HGB 12.5* 13.3  --   --   --   --   --   HCT 35.3* 38.1*  --   --   --   --   --   PLT 172 176  --   --   --   --   --   CREATININE 0.52* 0.59* 0.57* 0.66 0.68 0.47* 0.63  MG  --   --   --   --   --   --  1.7  ALBUMIN 2.9* 3.3* 3.6 3.2*  --   --   --   PROT 6.3* 6.9 7.6 6.9  --   --   --   AST 57* 60* 69* 63*  --   --   --   ALT 51 62 71* 65*  --   --   --   ALKPHOS 71 91 96 82  --   --   --   BILITOT 0.7 1.0 1.1 1.0  --   --   --      Estimated Creatinine Clearance: 159.7 mL/min (by C-G formula based on SCr of 0.63 mg/dL).  Potassium  Date Value Ref Range Status  09/26/2016 4.3 3.5 - 5.1 mmol/L Final  01/24/2014 3.8 3.5 - 5.1 mmol/L Final     Recent Labs (last 2 labs)    Recent Labs  09/24/16 1437 09/24/16 1811 09/24/16 2055  GLUCAP 161* 147* 132*      Assessment: 46 y/o M with pancreatitis due to hypertriglyceridemia on insulin drip.  Pharmacy consulted to monitor K while on insulin drip.  Plan:  K = 4.3 this evening. No supplementation necessary at this time.  Will continue to follow electrolytes q4h while on insulin drip and replace as necessary.   Cindi CarbonMary M  Rolen Conger, PharmD Clinical Pharmacist  09/26/16 8:21 PM

## 2016-09-27 LAB — BASIC METABOLIC PANEL
ANION GAP: 5 (ref 5–15)
Anion gap: 7 (ref 5–15)
BUN: 7 mg/dL (ref 6–20)
BUN: 6 mg/dL (ref 6–20)
CHLORIDE: 101 mmol/L (ref 101–111)
CHLORIDE: 100 mmol/L — AB (ref 101–111)
CO2: 29 mmol/L (ref 22–32)
CO2: 28 mmol/L (ref 22–32)
CREATININE: 0.63 mg/dL (ref 0.61–1.24)
CREATININE: 0.67 mg/dL (ref 0.61–1.24)
Calcium: 9.2 mg/dL (ref 8.9–10.3)
Calcium: 9.6 mg/dL (ref 8.9–10.3)
GFR calc Af Amer: >60 mL/min (ref >60)
GFR calc non Af Amer: >60 mL/min (ref >60)
GFR calc non Af Amer: >60 mL/min (ref >60)
GLUCOSE: 98 mg/dL (ref 65–99)
GLUCOSE: 120 mg/dL — AB (ref 65–99)
POTASSIUM: 3.7 mmol/L (ref 3.5–5.1)
Potassium: 4.2 mmol/L (ref 3.5–5.1)
Sodium: 135 mmol/L (ref 135–145)
Sodium: 135 mmol/L (ref 135–145)

## 2016-09-27 LAB — COMPREHENSIVE METABOLIC PANEL
ALBUMIN: 3.3 g/dL — AB (ref 3.5–5.0)
ALK PHOS: 81 U/L (ref 38–126)
ALT: 50 U/L (ref 17–63)
AST: 50 U/L — ABNORMAL HIGH (ref 15–41)
Anion gap: 6 (ref 5–15)
BUN: 6 mg/dL (ref 6–20)
CALCIUM: 9.4 mg/dL (ref 8.9–10.3)
CO2: 29 mmol/L (ref 22–32)
CREATININE: 0.66 mg/dL (ref 0.61–1.24)
Chloride: 100 mmol/L — ABNORMAL LOW (ref 101–111)
GFR calc Af Amer: >60 mL/min (ref >60)
GFR calc non Af Amer: >60 mL/min (ref >60)
GLUCOSE: 141 mg/dL — AB (ref 65–99)
Potassium: 4.5 mmol/L (ref 3.5–5.1)
SODIUM: 135 mmol/L (ref 135–145)
Total Bilirubin: 0.6 mg/dL (ref 0.3–1.2)
Total Protein: 7.1 g/dL (ref 6.5–8.1)

## 2016-09-27 LAB — TRIGLYCERIDES
TRIGLYCERIDES: 610 mg/dL — AB (ref <150)
Triglycerides: 524 mg/dL — ABNORMAL HIGH (ref <150)

## 2016-09-27 LAB — GLUCOSE, CAPILLARY
Glucose-Capillary: 87 mg/dL (ref 65–99)
Glucose-Capillary: 129 mg/dL — ABNORMAL HIGH (ref 65–99)
Glucose-Capillary: 259 mg/dL — ABNORMAL HIGH (ref 65–99)
Glucose-Capillary: 205 mg/dL — ABNORMAL HIGH (ref 65–99)

## 2016-09-27 LAB — HEMOGLOBIN A1C
Hgb A1c MFr Bld: 9.9 % — ABNORMAL HIGH (ref 4.8–5.6)
MEAN PLASMA GLUCOSE: 237 mg/dL

## 2016-09-27 MED ORDER — INSULIN ASPART 100 UNIT/ML ~~LOC~~ SOLN
0.0000 [IU] | Freq: Every day | SUBCUTANEOUS | Status: DC
Start: 2016-09-27 — End: 2016-09-30
  Administered 2016-09-27 – 2016-09-28 (×2): 2 [IU] via SUBCUTANEOUS
  Filled 2016-09-27: qty 2
  Filled 2016-09-27: qty 1
  Filled 2016-09-27: qty 2

## 2016-09-27 MED ORDER — INSULIN ASPART 100 UNIT/ML ~~LOC~~ SOLN
0.0000 [IU] | Freq: Three times a day (TID) | SUBCUTANEOUS | Status: DC
  Administered 2016-09-27: 5 [IU] via SUBCUTANEOUS
  Administered 2016-09-27: 1 [IU] via SUBCUTANEOUS
  Administered 2016-09-28 (×2): 2 [IU] via SUBCUTANEOUS
  Filled 2016-09-27 (×2): qty 2
  Filled 2016-09-27: qty 5
  Filled 2016-09-27: qty 1

## 2016-09-27 MED ORDER — OXYCODONE HCL 5 MG PO TABS: 5.0000 mg | ORAL_TABLET | ORAL | Status: DC | PRN

## 2016-09-27 MED ORDER — POTASSIUM CHLORIDE CRYS ER 20 MEQ PO TBCR
40.0000 meq | EXTENDED_RELEASE_TABLET | Freq: Once | ORAL | Status: AC
  Administered 2016-09-27: 40 meq via ORAL
  Filled 2016-09-27: qty 2

## 2016-09-27 MED ORDER — KCL IN DEXTROSE-NACL 40-5-0.9 MEQ/L-%-% IV SOLN
INTRAVENOUS | Status: DC
  Administered 2016-09-27: 06:00:00 via INTRAVENOUS
  Filled 2016-09-27 (×2): qty 1000

## 2016-09-27 NOTE — Progress Notes (Signed)
Inpatient Diabetes Program Recommendations  AACE/ADA: New Consensus Statement on Inpatient Glycemic Control (2015)  Target Ranges:  Prepandial:   less than 140 mg/dL      Peak postprandial:   less than 180 mg/dL (1-2 hours)      Critically ill patients:  140 - 180 mg/dL     Patient was seen by his Endocrinologist Dr. Aliene Altes on 05/01/16.  At that visit, patient was instructed to take the following meds for his Diabetes:  "Start metformin extended release and take 1000 mg once daily with dinner. Discontinue Humalog insulin. Start regular insulin (clear) by the vial and inject 14 units before each meal 3 times daily. Inject 20 minutes before starting your meal. Continue NPH (cloudy) insulin and inject 14 units once each night at bedtime.   If blood glucose is running high before a meal, take extra regular (clear) insulin using the following scale:  BG reading Units of extra meal-time insulin 200-249 2 250-299 4 300-349 6 350-399 8 >400 10  Please check your blood sugar before each shot of insulin 3-4 times daily and bring meter to each visit.  These are your blood sugar goals: 1. Fasting blood sugar 80-130. 2. Blood sugar at 2 hours after last meal <180."     MD- Note IV Insulin drip stopped this AM.  Patient will likely need Basal Insulin in addition to the Novolog SSI.    May also need Novolog Meal Coverage once PO intake increases.  Please consider the following if CBGs start to rise:  1. Start Lantus 14 units daily  2. Start Novolog Meal Coverage: Novolog 7 units TID with meals (50% home dose of bolus insulin to start) (hold if pt eats <50% of meal)     --Will follow patient during hospitalization--  Ambrose Finland RN, MSN, CDE Diabetes Coordinator Inpatient Glycemic Control Team Team Pager: [phone] (8a-5p)

## 2016-09-27 NOTE — Progress Notes (Signed)
PHARMACY - CRITICAL CARE PROGRESS NOTE  Pharmacy Consult for Electrolyte Monitoring  No Known Allergies  Patient Measurements: Height:  (190.5 cm) Weight: 248 lb 7.3 oz (112.7 kg) IBW/kg (Calculated) : 84.5  Vital Signs: Temp: 98.1 F (36.7 C) (03/27 2000) Temp Source: Axillary (03/27 2000) BP: 146/93 (03/27 2035) Pulse Rate: 69 (03/27 2035) Intake/Output from previous day: 03/26 0701 - 03/27 0700 In: 2131.4 [I.V.:2131.4] Out: 2640 [Urine:2640] Intake/Output from this shift: Total I/O In: 462 [P.O.:240; I.V.:222] Out: 300 [Urine:300] Vent settings for last 24 hours:  Labs:  Recent Labs (last 2 labs)    Recent Labs  09/22/16 0420 09/23/16 0534 09/23/16 1548 09/24/16 0841 09/24/16 1236 09/24/16 1547 09/24/16 1943  WBC 8.6 6.8 --  --  --  --  --   HGB 12.5* 13.3 --  --  --  --  --   HCT 35.3* 38.1* --  --  --  --  --   PLT 172 176 --  --  --  --  --   CREATININE 0.52* 0.59* 0.57* 0.66 0.68 0.47* 0.63  MG --  --  --  --  --  --  1.7  ALBUMIN 2.9* 3.3* 3.6 3.2* --  --  --   PROT 6.3* 6.9 7.6 6.9 --  --  --   AST 57* 60* 69* 63* --  --  --   ALT 51 62 71* 65* --  --  --   ALKPHOS 71 91 96 82 --  --  --   BILITOT 0.7 1.0 1.1 1.0 --  --  --      Estimated Creatinine Clearance: 159.7 mL/min (by C-G formula based on SCr of 0.63 mg/dL).  Last Labs       Potassium  Date Value Ref Range Status  09/26/2016 4.3 3.5 - 5.1 mmol/L Final  01/24/2014 3.8 3.5 - 5.1 mmol/L Final       Recent Labs (last 2 labs)    Recent Labs  09/24/16 1437 09/24/16 1811 09/24/16 2055  GLUCAP 161* 147* 132*      Assessment: 46 y/o M with pancreatitis due to hypertriglyceridemia on insulin drip.  Pharmacy consulted to monitor K while on insulin drip.  Plan: K = 4.3 this evening. No supplementation necessary at this time.  Will continue to follow electrolytes q4h while on insulin drip and replace as  necessary.   3/30 @ 0400 K 3.7, CBG 98 (goal > 120 while on insulin drip) -- currently running D10 @ 100 ml/hr, spoke to MD on call and will add D5-NS w/ 40 mEq of KCI @ 50 ml/hour and will continue to f/u on next BMP/CBG q4h  Thank you for this consult  Thomasene Ripple, PharmD, BCPS Clinical Pharmacist 09/27/2016

## 2016-09-27 NOTE — Progress Notes (Signed)
Patient was transferred from ICU. PICC line in place. Tele applied. A&O x4. Oriented to room, call light, TV and bed controls.

## 2016-09-27 NOTE — Progress Notes (Signed)
[facility] - [place] at [facility]   PATIENT NAME: Donald Hart    MR#:  [id number]  DATE OF BIRTH:  14-Jun-1971  SUBJECTIVE:  CHIEF COMPLAINT:  Patient Is feeling better, Tolerating Regular diet Constipated Abdominal pain is improving  REVIEW OF SYSTEMS:  CONSTITUTIONAL: No fever, fatigue or weakness.  [name], NOSE, [name], shortness of breath, [name], orthopnea, edema.  GASTROINTESTINALImproving abdominal pain No vomiting.  GENITOURINARY: No dysuria, [name], nocturia,  [name], [name], numbness, weakness.  PSYCHIATRY: No anxiety or depression.   DRUG ALLERGIES:  No Known Allergies  VITALS:  Blood pressure (!) 144/76, pulse 60, temperature 98.2 F (36.8 C), temperature source Oral, resp. rate (!) 9, height  (1.905 m), weight 114.9 kg (253 lb 4.9 oz), SpO2 98 %.  PHYSICAL EXAMINATION:  GENERAL:  46 y.o.-year-old patient lying in the bed with no acute distress.  EYES: Pupils equal, round, [name], normocephalic. Oropharynx and nasopharynx clear.  NECK:  Supple, [name], [name], no wheezing, rales,[name], [name], rubs, [name], Epigastrium With minimal tenderness no rebound tenderness, nondistended. Bowel sounds present. No organomegaly  EXTREMITIES: No pedal edema, cyanosis, or clubbing.  NEUROLOGIC:She Cranial nerves II through XII are intact. Muscle  strength 5/5 in all extremities. Sensation intact. Gait not checked.  PSYCHIATRIC: The patient is alert and oriented x 3.  SKIN: No obvious rash, lesion, or ulcer.    LABORATORY PANEL:   CBC  Recent Labs Lab 09/25/16 0519  WBC 7.2  HGB 13.9  HCT 39.8*  PLT 197   ------------------------------------------------------------------------------------------------------------------  Chemistries   Recent Labs Lab 09/25/16 0519  09/26/16 0429  09/27/16 0900  NA 138  < > 138  < > 135  K 3.7  < > 3.7  < > 4.2  CL 103  < > 103  < > 100*  CO2 30  < > 28  < > 28  GLUCOSE 62*  < > 78  < > 120*  BUN 6  < > 5*  < > 6  CREATININE 0.56*  < > 0.65  < > 0.67  CALCIUM 9.2  < > 8.9  < > 9.6  MG  --   --  1.9  --   --   AST 42*  --   --   --   --   ALT 57  --   --   --   --   ALKPHOS 84  --   --   --   --   BILITOT 0.6  --   --   --   --   < > = values in this interval not displayed. ------------------------------------------------------------------------------------------------------------------  Cardiac Enzymes  Recent Labs Lab 09/20/16 1613  TROPONINI <0.03   ------------------------------------------------------------------------------------------------------------------  RADIOLOGY:  No results found.  EKG:   Orders placed or performed during the hospital encounter of 09/20/16  . EKG 12-Lead  . EKG 12-Lead  . ED EKG within 10 minutes  . ED EKG  within 10 minutes    ASSESSMENT AND PLAN:   [name] admitted for pancreatitis.  1. Pancreatitis: Acute on chronic; secondary to hypertriglyceridemia,Worsened by alcohol abuse not sure if he has any component of genetic predisposition -Clinically improving  lipase is normal but pain and etiology of hypertriglyceridemia consistent with pancreatitis.  -Tolerating diet  diabetic and cardiac diet Renal function and calcium are normal.  Check a.m. labs   2. Hypertriglyceridemia: Normal total cholesterol.  Continue statin therapy as well as gemfibrozil Triglycerides 3800--2349--1490 --[phone] Discontinue insulin drip Transfer patient to the floor Continue Lipitor and RESUME gemfibrozil, and omega-3 fatty acids Outpatient follow-up with the patient's primary endocrinologist DR.Abisogun Follow-up with cardiology Discontinue IV Dilaudid and continue oxycodone as needed  3. Hyponatremia: Pseudo-; secondary to elevated lipids and history of alcohol abuse/dehydration Resolved with IV fluids Monitor serial sodiums 135-129--123--136--134   4. Diabetes mellitus type 2: Most recent hemoglobin A1c 7.1;  continue insulin drip per pharmacy Monitor sugars. Repeat hemoglobin A1c 9.9  5. Hypertension blood pressure is elevated from the pain Improved now Continue losartan , amlodipine and Coreg and titrate as needed Tolerating diet, decrease IV fluid rate  #Constipation laxatives Out of bed as tolerated Transfer to floor  . Tobacco abuse: Counseled patient to quit smoking for 5 minutes. Patient is agreeable. on NicoDerm patch  #. History of alcohol abuse - CIWA protocol   DVT prophylaxis: Lovenox . GI prophylaxis: Pantoprazole  PT- no PT needs identified    All the records are reviewed and case discussed with Care Management/Social Workerr. Management plans discussed with the patient,wife over phone agreeable with the plan CODE STATUS: fc   TOTAl critical care TIME TAKING CARE OF THIS PATIENT: .   POSSIBLE D/C IN 2-3 DAYS, DEPENDING ON CLINICAL CONDITION.  Note: This dictation was prepared with Dragon dictation along with smaller phrase technology. Any transcriptional errors that result from this process are unintentional.   Ramonita Lab M.D on 09/27/2016 at 10:46 AM  Between 7am to 6pm - Pager - [phone] After 6pm go to [email or web address] - password [email or web address]  [place] [facility]  Office  [phone]  CC: Primary care physician; [name], MD

## 2016-09-27 NOTE — Progress Notes (Signed)
Pt has remained alert and oriented with c/o central, abd pain. Dilaudid given x1 - has now been d/c. Pt with PRN roxi for pain. Pt has remained on RA. Lung sounds clear/diminished to auscultation. SpO2 100%. Pt has remained in NSR on cardiac monitor. Pt has not had a BM since 09-20-2016 last documentation. Pt has refused suppository. Colace and senna have been given. Triglycerides are 524-insulin gtt d/c. Pt with orders to tx to med-surg.

## 2016-09-27 NOTE — Progress Notes (Signed)
Patient is A&O x4. Up to bathroom independently, steady gait. Oral pain meds as needed for pain, with good relief.  BM this shift. ABD semi-firm, BS present. Tele in place. Tolerated diet.

## 2016-09-27 NOTE — Progress Notes (Signed)
PHARMACY - CONSULT NOTE  Pharmacy Consult for Electrolyte Monitoring  No Known Allergies  Patient Measurements: Height:  (190.5 cm) Weight: 248 lb 7.3 oz (112.7 kg) IBW/kg (Calculated) : 84.5  Vital Signs: Temp: 98.1 F (36.7 C) (03/27 2000) Temp Source: Axillary (03/27 2000) BP: 146/93 (03/27 2035) Pulse Rate: 69 (03/27 2035) Intake/Output from previous day: 03/26 0701 - 03/27 0700 In: 2131.4 [I.V.:2131.4] Out: 2640 [Urine:2640] Intake/Output from this shift: Total I/O In: 462 [P.O.:240; I.V.:222] Out: 300 [Urine:300] Vent settings for last 24 hours:  Labs:  Recent Labs (last 2 labs)    Recent Labs  09/22/16 0420 09/23/16 0534 09/23/16 1548 09/24/16 0841 09/24/16 1236 09/24/16 1547 09/24/16 1943  WBC 8.6 6.8 --  --  --  --  --   HGB 12.5* 13.3 --  --  --  --  --   HCT 35.3* 38.1* --  --  --  --  --   PLT 172 176 --  --  --  --  --   CREATININE 0.52* 0.59* 0.57* 0.66 0.68 0.47* 0.63  MG --  --  --  --  --  --  1.7  ALBUMIN 2.9* 3.3* 3.6 3.2* --  --  --   PROT 6.3* 6.9 7.6 6.9 --  --  --   AST 57* 60* 69* 63* --  --  --   ALT 51 62 71* 65* --  --  --   ALKPHOS 71 91 96 82 --  --  --   BILITOT 0.7 1.0 1.1 1.0 --  --  --      Estimated Creatinine Clearance: 159.7 mL/min (by C-G formula based on SCr of 0.63 mg/dL).  Last Labs       Potassium  Date Value Ref Range Status  09/26/2016 4.3 3.5 - 5.1 mmol/L Final  01/24/2014 3.8 3.5 - 5.1 mmol/L Final       Recent Labs (last 2 labs)    Recent Labs  09/24/16 1437 09/24/16 1811 09/24/16 2055  GLUCAP 161* 147* 132*      Assessment: 46 y/o M with pancreatitis due to hypertriglyceridemia on insulin drip.  Pharmacy consulted to monitor K while on insulin drip.  Plan: K = 4.3 this evening. No supplementation necessary at this time.  Will continue to follow electrolytes q4h while on insulin drip and replace as necessary.   3/30 @  0400 K 3.7, CBG 98 (goal > 120 while on insulin drip) -- currently running D10 @ 100 ml/hr, spoke to MD on call and will add D5-NS w/ 40 mEq of KCI @ 50 ml/hour and will continue to f/u on next BMP/CBG q4h  3/30- Insulin drip d/c. K+ at 1115= 4.5. Will recheck K+/BMP in am. Now on SSI.  Thank you for this consult  Bari Mantis PharmD Clinical Pharmacist 09/27/2016

## 2016-09-27 NOTE — Progress Notes (Signed)
Patient rested well throughout the night. Stated his pain is a little worse than it was during the daytime and received dilaudid and oxycodone as often as he was able. Dextrose 5% with KCL at 50mL was added as an additional maintenance fluid. Pt stated he felt like he was never going to be able to get out the hospital. VSS throughout night.

## 2016-09-28 LAB — GLUCOSE, CAPILLARY
Glucose-Capillary: 165 mg/dL — ABNORMAL HIGH (ref 65–99)
Glucose-Capillary: 181 mg/dL — ABNORMAL HIGH (ref 65–99)
Glucose-Capillary: 132 mg/dL — ABNORMAL HIGH (ref 65–99)
Glucose-Capillary: 222 mg/dL — ABNORMAL HIGH (ref 65–99)

## 2016-09-28 LAB — BASIC METABOLIC PANEL
ANION GAP: 8 (ref 5–15)
BUN: 13 mg/dL (ref 6–20)
CALCIUM: 9.8 mg/dL (ref 8.9–10.3)
CO2: 28 mmol/L (ref 22–32)
Chloride: 97 mmol/L — ABNORMAL LOW (ref 101–111)
Creatinine, Ser: 0.64 mg/dL (ref 0.61–1.24)
GFR calc Af Amer: >60 mL/min (ref >60)
GFR calc non Af Amer: >60 mL/min (ref >60)
Glucose, Bld: 191 mg/dL — ABNORMAL HIGH (ref 65–99)
Potassium: 4.7 mmol/L (ref 3.5–5.1)
SODIUM: 133 mmol/L — AB (ref 135–145)

## 2016-09-28 LAB — TRIGLYCERIDES
TRIGLYCERIDES: 650 mg/dL — AB (ref <150)
TRIGLYCERIDES: 676 mg/dL — AB (ref <150)
TRIGLYCERIDES: 654 mg/dL — AB (ref <150)

## 2016-09-28 MED ORDER — SODIUM CHLORIDE 0.9 % IV SOLN
INTRAVENOUS | Status: AC
  Administered 2016-09-28 (×2): via INTRAVENOUS

## 2016-09-28 MED ORDER — INSULIN ASPART 100 UNIT/ML ~~LOC~~ SOLN
0.0000 [IU] | Freq: Three times a day (TID) | SUBCUTANEOUS | Status: DC
  Administered 2016-09-28 – 2016-09-29 (×2): 1 [IU] via SUBCUTANEOUS
  Administered 2016-09-29 – 2016-09-30 (×3): 2 [IU] via SUBCUTANEOUS
  Filled 2016-09-28 (×3): qty 2
  Filled 2016-09-28: qty 1

## 2016-09-28 MED ORDER — INSULIN ASPART 100 UNIT/ML ~~LOC~~ SOLN
7.0000 [IU] | Freq: Three times a day (TID) | SUBCUTANEOUS | Status: DC
  Administered 2016-09-29 – 2016-09-30 (×4): 7 [IU] via SUBCUTANEOUS
  Filled 2016-09-28 (×4): qty 7

## 2016-09-28 MED ORDER — INSULIN GLARGINE 100 UNIT/ML ~~LOC~~ SOLN
10.0000 [IU] | Freq: Every day | SUBCUTANEOUS | Status: DC
Start: 2016-09-28 — End: 2016-09-30
  Administered 2016-09-28 – 2016-09-30 (×3): 10 [IU] via SUBCUTANEOUS
  Filled 2016-09-28 (×3): qty 0.1

## 2016-09-28 NOTE — Progress Notes (Signed)
Notified MD of Triglyceride level of 654. Dr. Amado Coe stated that he didn't need to be transferred to ICU at this time and to continue IV fluids at ordered rate. Will re-draw triglyceride level in am (already ordered).

## 2016-09-28 NOTE — Progress Notes (Addendum)
Patient is A&O x4. Up independently in room, steady gait. Oral pain meds q4h, with good relief. Patient is found sleeping in between doses. Blood sugars 165, 181, and 132, received SS insulin per orders. Se NN about lab work. IV fluids infusing. PICC line in place. Had a BM this shift, per patient.

## 2016-09-28 NOTE — Progress Notes (Signed)
[facility] Physicians - [place] at [facility]   PATIENT NAME: Donald Hart    MR#:  [id number]  DATE OF BIRTH:  1971-05-10  SUBJECTIVE:  CHIEF COMPLAINT:  Patient Is feeling better, Tolerating Regular diet,Had bowel movements Abdominal pain is improving  REVIEW OF SYSTEMS:  CONSTITUTIONAL: No fever, fatigue or weakness.  [name], NOSE, [name], shortness of breath, [name], orthopnea, edema.  GASTROINTESTINALImproving abdominal pain No vomiting.  GENITOURINARY: No dysuria, [name], nocturia,  [name], [name], numbness, weakness.  PSYCHIATRY: No anxiety or depression.   DRUG ALLERGIES:  No Known Allergies  VITALS:  Blood pressure 134/77, pulse 69, temperature 98.7 F (37.1 C), temperature source Oral, resp. rate 18, height  (1.905 m), weight 115.8 kg (255 lb 4.7 oz), SpO2 99 %.  PHYSICAL EXAMINATION:  GENERAL:  46 y.o.-year-old patient lying in the bed with no acute distress.  EYES: Pupils equal, round, [name], normocephalic. Oropharynx and nasopharynx clear.  NECK:  Supple, [name], [name], no wheezing, rales,[name], [name], rubs, [name], Epigastrium With minimal tenderness no rebound tenderness, nondistended. Bowel sounds present. No organomegaly  EXTREMITIES: No pedal edema, cyanosis, or clubbing.  NEUROLOGIC:She Cranial nerves II through XII are intact. Muscle  strength 5/5 in all extremities. Sensation intact. Gait not checked.  PSYCHIATRIC: The patient is alert and oriented x 3.  SKIN: No obvious rash, lesion, or ulcer.    LABORATORY PANEL:   CBC  Recent Labs Lab 09/25/16 0519  WBC 7.2  HGB 13.9  HCT 39.8*  PLT 197   ------------------------------------------------------------------------------------------------------------------  Chemistries   Recent Labs Lab 09/26/16 0429  09/27/16 1115 09/28/16 0512  NA 138  < > 135 133*  K 3.7  < > 4.5 4.7  CL 103  < > 100* 97*  CO2 28  < > 29 28  GLUCOSE 78  < > 141* 191*  BUN 5*  < > 6 13  CREATININE 0.65  < > 0.66 0.64  CALCIUM 8.9  < > 9.4 9.8  MG 1.9  --   --   --   AST  --   --  50*  --   ALT  --   --  50  --   ALKPHOS  --   --  81  --   BILITOT  --   --  0.6  --   < > = values in this interval not displayed. ------------------------------------------------------------------------------------------------------------------  Cardiac Enzymes No results for input(s): TROPONINI in the last 168 hours. ------------------------------------------------------------------------------------------------------------------  RADIOLOGY:  No results found.  EKG:   Orders placed or performed during the hospital encounter of 09/20/16  . EKG 12-Lead  . EKG 12-Lead  . ED EKG within 10 minutes  . ED EKG within 10 minutes    ASSESSMENT AND PLAN:   [name] admitted for pancreatitis.  # Pancreatitis: Acute on chronic; secondary to hypertriglyceridemia,Worsened by alcohol abuse not sure if he has any component of genetic predisposition -Clinically  better  lipase is normal but pain and etiology of hypertriglyceridemia consistent with pancreatitis.  -Tolerating diet  diabetic and cardiac diet Renal function and calcium are normal.  Check a.m. labs   #. Hypertriglyceridemia: Normal total cholesterol. Continue statin therapy as well as gemfibrozil Triglycerides  3800--2349--1490 --[phone], insulin drip discontinued-[phone]--[phone] Discontinued insulin drip on March 30 of and triglycerides are at 524 Started patient on IV fluids   repeat triglycerides at 4 PM if they're greater than 650 will transfer him to ICU and restart insulin drip Continue Lipitor and RESUME gemfibrozil, and omega-3 fatty acids Outpatient follow-up with the patient's primary endocrinologist DR.Abisogun Follow-up with cardiology Discontinued IV Dilaudid and continue oxycodone as needed   # Diabetes mellitus type 2: Most recent hemoglobin A1c 7.1;  continue insulin drip per pharmacy Monitor sugars. Repeat hemoglobin A1c 9.9 Start patient on Lantus 10 units daily and Novolin 7 units 3 times a day with meals   # . Hypertension blood pressure is elevated from the pain intermitently Improved now Continue losartan , amlodipine and Coreg and titrate as needed Tolerating diet, decrease IV fluid rate  #Constipation laxatives Out of bed as tolerated Transfer to floor  . Tobacco abuse: Counseled patient to quit smoking for 5 minutes. Patient is agreeable. on NicoDerm patch  #. History of alcohol abuse - CIWA protocol   DVT prophylaxis: Lovenox . GI prophylaxis: Pantoprazole  PT- no PT needs identified    All the records are reviewed and case discussed with Care Management/Social Workerr. Management plans discussed with the patient,wife over phone agreeable with the plan CODE STATUS: fc   TOTAl critical care TIME TAKING CARE OF THIS PATIENT: .   POSSIBLE D/C IN 2 DAYS, DEPENDING ON CLINICAL CONDITION.  Note: This dictation was prepared with Dragon dictation along with smaller phrase technology. Any transcriptional errors that result from this process are unintentional.   Ramonita Lab M.D on 09/28/2016 at 12:51 PM  Between 7am to 6pm - Pager - [phone] After 6pm go to [email or web address] - password [email or web address] [facility]  [place] [facility]  Office   [phone]  CC: Primary care physician; [name], MD

## 2016-09-28 NOTE — Progress Notes (Signed)
PHARMACY - CONSULT NOTE  Pharmacy Consult for Electrolyte Monitoring  No Known Allergies  Patient Measurements: Height:  (190.5 cm) Weight: 248 lb 7.3 oz (112.7 kg) IBW/kg (Calculated) : 84.5  Vital Signs: Temp: 98.1 F (36.7 C) (03/27 2000) Temp Source: Axillary (03/27 2000) BP: 146/93 (03/27 2035) Pulse Rate: 69 (03/27 2035) Intake/Output from previous day: 03/26 0701 - 03/27 0700 In: 2131.4 [I.V.:2131.4] Out: 2640 [Urine:2640] Intake/Output from this shift: Total I/O In: 462 [P.O.:240; I.V.:222] Out: 300 [Urine:300] Vent settings for last 24 hours:  Labs:  BMP Latest Ref Rng & Units 09/28/2016 09/27/2016 09/27/2016  Glucose 65 - 99 mg/dL 161(W) 960(A) 540(J)  BUN 6 - 20 mg/dL Creatinine 0.61 - 1.24 mg/dL 8.11 9.14 7.82  Sodium 135 - 145 mmol/L 133(L) 135 135  Potassium 3.5 - 5.1 mmol/L 4.7 4.5 4.2  Chloride 101 - 111 mmol/L 97(L) 100(L) 100(L)  CO2 22 - 32 mmol/L Calcium 8.9 - 10.3 mg/dL 9.8 9.4 9.6   Magnesium  Date Value Ref Range Status  09/26/2016 1.9 1.7 - 2.4 mg/dL Final  95/62/1308 1.5 (L) mg/dL Final    Comment:    6.5-7.8 THERAPEUTIC RANGE: 4-7 mg/dL TOXIC: > 10 mg/dL  -----------------------         Assessment: 47 y/o M with pancreatitis due to hypertriglyceridemia on insulin drip.  Pharmacy consulted to monitor K while on insulin drip.  Plan: Electrolytes remain WNL with patient off insulin drip. Will sign off on consult for now.   Luisa Hart, PharmD Clinical Pharmacist  09/28/2016

## 2016-09-29 ENCOUNTER — Inpatient Hospital Stay: Payer: BLUE CROSS/BLUE SHIELD

## 2016-09-29 LAB — COMPREHENSIVE METABOLIC PANEL
ALK PHOS: 72 U/L (ref 38–126)
ALT: 43 U/L (ref 17–63)
AST: 40 U/L (ref 15–41)
Albumin: 3.6 g/dL (ref 3.5–5.0)
Anion gap: 7 (ref 5–15)
BILIRUBIN TOTAL: 0.8 mg/dL (ref 0.3–1.2)
BUN: 14 mg/dL (ref 6–20)
CO2: 28 mmol/L (ref 22–32)
Calcium: 9.5 mg/dL (ref 8.9–10.3)
Chloride: 100 mmol/L — ABNORMAL LOW (ref 101–111)
Creatinine, Ser: 0.78 mg/dL (ref 0.61–1.24)
GFR calc non Af Amer: >60 mL/min (ref >60)
GLUCOSE: 165 mg/dL — AB (ref 65–99)
Potassium: 4.7 mmol/L (ref 3.5–5.1)
SODIUM: 135 mmol/L (ref 135–145)
Total Protein: 7.3 g/dL (ref 6.5–8.1)

## 2016-09-29 LAB — GLUCOSE, CAPILLARY
Glucose-Capillary: 138 mg/dL — ABNORMAL HIGH (ref 65–99)
Glucose-Capillary: 195 mg/dL — ABNORMAL HIGH (ref 65–99)
Glucose-Capillary: 120 mg/dL — ABNORMAL HIGH (ref 65–99)
Glucose-Capillary: 178 mg/dL — ABNORMAL HIGH (ref 65–99)

## 2016-09-29 LAB — TRIGLYCERIDES: Triglycerides: 647 mg/dL — ABNORMAL HIGH (ref <150)

## 2016-09-29 LAB — LIPASE, BLOOD: Lipase: 17 U/L (ref 11–51)

## 2016-09-29 MED ORDER — HYOSCYAMINE SULFATE 0.125 MG SL SUBL
0.1250 mg | SUBLINGUAL_TABLET | Freq: Four times a day (QID) | SUBLINGUAL | Status: DC | PRN
  Filled 2016-09-29: qty 1

## 2016-09-29 MED ORDER — IOPAMIDOL (ISOVUE-300) INJECTION 61%
100.0000 mL | Freq: Once | INTRAVENOUS | Status: AC | PRN
  Administered 2016-09-29: 100 mL via INTRAVENOUS

## 2016-09-29 MED ORDER — IOPAMIDOL (ISOVUE-300) INJECTION 61%
15.0000 mL | INTRAVENOUS | Status: AC
  Administered 2016-09-29 (×2): 15 mL via ORAL

## 2016-09-29 MED ORDER — AMITRIPTYLINE HCL 25 MG PO TABS
75.0000 mg | ORAL_TABLET | Freq: Every day | ORAL | Status: DC
  Administered 2016-09-29: 75 mg via ORAL
  Filled 2016-09-29: qty 3

## 2016-09-29 NOTE — Progress Notes (Addendum)
[facility] [place] at [facility]   PATIENT NAME: Domonic Kimball    MR#:  [id number]  DATE OF BIRTH:  01-13-1971  SUBJECTIVE:  CHIEF COMPLAINT:  Patient Is Persistently reporting epigastric abdominal pain but tolerating diet with no nausea or vomiting. Having regular bowel movements   REVIEW OF SYSTEMS:  CONSTITUTIONAL: No fever, fatigue or weakness.  [name], NOSE, [name], shortness of breath, [name], orthopnea, edema.  GASTROINTESTINALImproving abdominal pain No vomiting.  GENITOURINARY: No dysuria, [name], nocturia,  [name], [name], numbness, weakness.  PSYCHIATRY: No anxiety or depression.   DRUG ALLERGIES:  No Known Allergies  VITALS:  Blood pressure (!) 133/92, pulse 63, temperature 98.3 F (36.8 C), temperature source Oral, resp. rate 18, height  (1.905 m), weight 116.2 kg (256 lb 3.2 oz), SpO2 100 %.  PHYSICAL EXAMINATION:  GENERAL:  46 y.o.-year-old patient lying in the bed with no acute distress.  EYES: Pupils equal, round, [name], normocephalic. Oropharynx and nasopharynx clear.  NECK:  Supple, [name], [name], no wheezing, rales,[name], [name], rubs, [name], Epigastrium With minimal tenderness no rebound tenderness, nondistended. Bowel sounds present. No organomegaly  EXTREMITIES: No pedal edema, cyanosis, or clubbing.  NEUROLOGIC:She  Cranial nerves II through XII are intact. Muscle strength 5/5 in all extremities. Sensation intact. Gait not checked.  PSYCHIATRIC: The patient is alert and oriented x 3.  SKIN: No obvious rash, lesion, or ulcer.    LABORATORY PANEL:   CBC  Recent Labs Lab 09/25/16 0519  WBC 7.2  HGB 13.9  HCT 39.8*  PLT 197   ------------------------------------------------------------------------------------------------------------------  Chemistries   Recent Labs Lab 09/26/16 0429  09/29/16 0430  NA 138  < > 135  K 3.7  < > 4.7  CL 103  < > 100*  CO2 28  < > 28  GLUCOSE 78  < > 165*  BUN 5*  < > 14  CREATININE 0.65  < > 0.78  CALCIUM 8.9  < > 9.5  MG 1.9  --   --   AST  --   < > 40  ALT  --   < > 43  ALKPHOS  --   < > 72  BILITOT  --   < > 0.8  < > = values in this interval not displayed. ------------------------------------------------------------------------------------------------------------------  Cardiac Enzymes No results for input(s): TROPONINI in the last 168 hours. ------------------------------------------------------------------------------------------------------------------  RADIOLOGY:  No results found.  EKG:   Orders placed or performed during the hospital encounter of 09/20/16  . EKG 12-Lead  . EKG 12-Lead  . ED EKG within 10 minutes  . ED EKG within 10 minutes    ASSESSMENT AND PLAN:   [name] admitted for pancreatitis.  # Pancreatitis: Acute on chronic; secondary to hypertriglyceridemia,Worsened by alcohol abuse not sure if he has any component of genetic predisposition -Clinically better but patient is persistently complaining of epigastric abdominal pain. Lipase is 17 -Repeat CT abdomen and  pelvis ordered  lipase is normal but pain and etiology of hypertriglyceridemia consistent with pancreatitis.  -Tolerating diet  diabetic and cardiac diet Renal function and calcium are normal.  -Bentyl has been changed to  hyosciamine -Amitriptyline dose increased to 75 mg -We will add pancreatic enzymes as needed -Outpatient follow-up with the [facility] pancreologist is recommended -Appreciate GI recommendations  #. Hypertriglyceridemia: Normal total cholesterol. Continue statin therapy as well as gemfibrozil Triglycerides [phone] --[phone], insulin drip discontinued-[phone] Discontinued insulin drip on March 30 of and triglycerides are at 524 patient on IV fluids , triglycerides are at 647 patient is refusing insulin drip Continue Lipitor and RESUME gemfibrozil, and omega-3 fatty acids Outpatient follow-up with the patient's primary endocrinologist DR.[email or web address] Follow-up with cardiology Discontinued IV Dilaudid and continue oxycodone as needed   # Diabetes mellitus type 2: Most recent hemoglobin A1c 7.1;  continue insulin drip per pharmacy Monitor sugars. Repeat hemoglobin A1c 9.9  patient on Lantus 10 units daily and Novolin 7 units 3 times a day with meals   # . Hypertension blood pressure is elevated from the pain intermitently Improved now Continue losartan , amlodipine and Coreg and titrate as needed Tolerating diet, decrease IV fluid rate  #Constipation laxatives Out of bed as tolerated Transfer to floor  . Tobacco abuse: Counseled patient to quit smoking for 5 minutes. Patient is agreeable. on NicoDerm patch  #. History of alcohol abuse - CIWA protocol   DVT prophylaxis: Lovenox . GI prophylaxis: Pantoprazole  PT- no PT needs identified    All the records are reviewed and case discussed with Care Management/Social Workerr. Management plans discussed with the patient,wife over phone agreeable with the plan CODE STATUS: fc   TOTAl critical care TIME TAKING CARE OF THIS PATIENT: .   POSSIBLE D/C IN 1-2 DAYS, DEPENDING ON CLINICAL CONDITION.  Note: This dictation was prepared with Dragon dictation along with smaller phrase technology. Any  transcriptional errors that result from this process are unintentional.   Ramonita Lab M.D on 09/29/2016 at 1:21 PM  Between 7am to 6pm - Pager - [phone] After 6pm go to [email or web address] - password [email or web address] [facility]  [facility]  Office  [phone]  CC: Primary care physician; [name], MD

## 2016-09-29 NOTE — Progress Notes (Signed)
Spoke to [place] in [place] to advise him that patient finished contrast.

## 2016-09-29 NOTE — Consult Note (Signed)
Consultation  Referring Provider:     No ref. provider found Primary Care Physician:  [name], MD Primary Gastroenterologist: none        Reason for Consultation: abdominal pain  Date of Admission:  09/20/2016 Date of Consultation:  09/29/2016         HPI:   Donald Hart is a 46 y.o. male with a history notable for alcoholism, DM II and recurrent episodes of acute pancreatitis who is re-hospitalized for acute pancreatitis and remains hospitalized for severe abdominal pain.   The patient does not provide much history. He reports that he continues to have pain, although it is improved from admission. He reports epigastric abdominal pain that radiates to his RLQ, back and chest. The pain feels like he is being kicked in the chest. It is worst at night and constantly wakes him from his sleep. He does not believe the pain is worse post-prandially. He reports "regular" BMs, which for him is having a BM a couple of times a day, usually post-prandially. He denies oily, greasy, foamy or fatty stools that float.   He reports last having a drink of alcohol about 3 weeks ago. He admits to drinking heavily for nearly 20 years.   He denies n/v/c, f/s/c, rashes, joint pains or evidence of GI bleeding.   Past Medical History:  Diagnosis Date  . Acute pancreatitis 07/2015   hospitalization at [facility]  . Alcohol abuse   . Chronic back pain   . Family history of diabetes mellitus    sister  . Family history of heart attack    Father.   . Family history of stomach cancer   . Gout   . Hypertension   . Hypertriglyceridemia   . Type 2 diabetes mellitus (HCC) 07/2015    Past Surgical History:  Procedure Laterality Date  . NO PAST SURGERIES      Prior to Admission medications   Medication Sig Start Date End Date Taking? Authorizing Provider  albuterol (PROVENTIL HFA;VENTOLIN HFA) 108 (90 Base) MCG/ACT inhaler Inhale 2 puffs into the lungs every 6 (six) hours as needed for  wheezing or shortness of breath. 11/18/15  Yes Emily Filbert, MD  allopurinol (ZYLOPRIM) 100 MG tablet Take 2 tablets by mouth daily. 05/17/15  Yes Historical Provider, MD  amitriptyline (ELAVIL) 25 MG tablet Take 25 mg by mouth at bedtime.   Yes Historical Provider, MD  amLODipine (NORVASC) 10 MG tablet Take 1 tablet by mouth daily. 05/17/15  Yes Historical Provider, MD  atorvastatin (LIPITOR) 40 MG tablet Take 40 mg by mouth daily. Reported on 07/21/2015 07/12/15 09/20/16 Yes Historical Provider, MD  carvedilol (COREG) 6.25 MG tablet Take 1 tablet by mouth 2 (two) times daily. 05/22/15  Yes Historical Provider, MD  colchicine 0.6 MG tablet Take 0.6 mg by mouth daily as needed.   Yes Historical Provider, MD  diclofenac sodium (VOLTAREN) 1 % GEL Apply 2 g topically 4 (four) times daily.   Yes Historical Provider, MD  dicyclomine (BENTYL) 10 MG capsule Take 10 mg by mouth 4 (four) times daily as needed for spasms.   Yes Historical Provider, MD  folic acid (FOLVITE) 1 MG tablet Take 1 mg by mouth daily.   Yes Historical Provider, MD  gabapentin (NEURONTIN) 300 MG capsule Take 300 mg by mouth 3 (three) times daily.   Yes Historical Provider, MD  gemfibrozil (LOPID) 600 MG tablet Take 1 tablet (600 mg total) by mouth 2 (two) times daily before a meal.  07/09/15  Yes Altamese Dilling, MD  ibuprofen (ADVIL,MOTRIN) 200 MG tablet Take 800 mg by mouth every 8 (eight) hours as needed.    Yes Historical Provider, MD  insulin NPH Human (HUMULIN N,NOVOLIN N) 100 UNIT/ML injection Inject 14 Units into the skin at bedtime. Reported on 07/21/2015 07/17/15 09/20/16 Yes Historical Provider, MD  insulin regular (NOVOLIN R,HUMULIN R) 250 units/2.2mL (100 units/mL) injection Inject 14 Units into the skin 3 (three) times daily before meals.   Yes Historical Provider, MD  losartan (COZAAR) 100 MG tablet Take 100 mg by mouth daily.   Yes Historical Provider, MD  magnesium oxide (MAG-OX) 400 MG tablet Take 400 mg by mouth  daily.   Yes Historical Provider, MD  Melatonin 3 MG TABS Take 6 mg by mouth at bedtime.   Yes Historical Provider, MD  omega-3 acid ethyl esters (LOVAZA) 1 g capsule Take 2 g by mouth 2 (two) times daily.   Yes Historical Provider, MD  pantoprazole (PROTONIX) 40 MG tablet Take 40 mg by mouth daily.   Yes Historical Provider, MD  clindamycin (CLEOCIN) 300 MG capsule Take 1 capsule (300 mg total) by mouth 3 (three) times daily. Patient not taking: Reported on 09/20/2016 09/04/16   Emily Filbert, MD  guaiFENesin-dextromethorphan [facility] DM) 100-10 MG/5ML syrup Take 10 mLs by mouth every 6 (six) hours as needed for cough. Patient not taking: Reported on 09/04/2016 07/15/16   Ramonita Lab, MD  menthol-cetylpyridinium (CEPACOL) 3 MG lozenge Take 1 lozenge (3 mg total) by mouth as needed for sore throat. Patient not taking: Reported on 09/20/2016 07/15/16   Ramonita Lab, MD  methylPREDNISolone (MEDROL) 4 MG TBPK tablet Dispense Medrol Dosepak as directed Patient not taking: Reported on 09/20/2016 09/04/16   Emily Filbert, MD  oxyCODONE-acetaminophen (PERCOCET) 5-325 MG tablet Take 2 tablets by mouth every 6 (six) hours as needed for moderate pain or severe pain. Patient not taking: Reported on 09/20/2016 09/04/16   Emily Filbert, MD    Family History  Problem Relation Age of Onset  . Adopted: Yes  . Heart attack Father   . Stomach cancer Father   . Diabetes Father   . Diabetes Sister   . Diabetes Sister      Social History  Substance Use Topics  . Smoking status: Current Every Day Smoker    Packs/day: 1.00    Years: 20.00    Types: Cigarettes  . Smokeless tobacco: Never Used  . Alcohol use 100.8 oz/week    12 Cans of beer per week     Comment: none since January hospitalization    Allergies as of 09/20/2016  . (No Known Allergies)    Review of Systems:    All systems reviewed and negative except where noted in HPI.   Physical Exam:  Vital signs in last 24 hours: Temp:   [98.3 F (36.8 C)-98.6 F (37 C)] 98.3 F (36.8 C) (04/01 0722) Pulse Rate:  [63-67] 63 (04/01 0722) Resp:  [18] 18 (04/01 0722) BP: (120-133)/(84-93) 133/92 (04/01 0722) SpO2:  [94 %-100 %] 100 % (04/01 0722) Weight:  [116.2 kg (256 lb 3.2 oz)] 116.2 kg (256 lb 3.2 oz) (04/01 0435) Last BM Date: 09/28/16   General: Obese white male resting in a hospital bed comfortably watching TV Head: Normocephalic and atraumatic. Eyes: No icterus or conjunctival pallor Ears: Normal auditory acuity. Lungs: Respirations even and unlabored. Lungs clear to auscultation bilaterally. Heart: Regular rate and rhythm, normal S1, S2, without murmurs Abdomen: +BS, soft  nondistended, nontender.  Rectal: Not performed. Msk: Symmetrical without gross deformities. Extremities: Without edema, cyanosis or clubbing. Neurologic: Alert, grossly normal neurologically. Skin: Intact without significant lesions or rashes. Psych: Alert and cooperative  LAB RESULTS: No results for input(s): WBC, HGB, HCT, PLT in the last 72 hours. BMET  Recent Labs  09/27/16 1115 09/28/16 0512 09/29/16 0430  NA 135 133* 135  K 4.5 4.7 4.7  CL 100* 97* 100*  CO2 GLUCOSE 141* 191* 165*  Hart CREATININE 0.66 0.64 0.78  CALCIUM 9.4 9.8 9.5   LFT  Recent Labs  09/29/16 0430  PROT 7.3  ALBUMIN 3.6  AST 40  ALT 43  ALKPHOS 72  BILITOT 0.8   PT/INR No results for input(s): LABPROT, INR in the last 72 hours.  STUDIES: No results found.    Impression / Plan:   Donald Hart is a 46 y.o. y/o male with a history notable for alcoholism, DM II and recurrent episodes of acute pancreatitis who is re-hospitalized for acute pancreatitis and remains hospitalized for severe abdominal pain. While the patient is certainly at risk for chronic pancreatitis, his imaging and history do not seem most consistent with that at this time. The patient's abdominal pain is most likely neuropathic in nature. Other  etiologies for his abdominal pain may be due to pancreatitis itself, including evolving complications. His pain may be muscular as well since gemfibrozil is the fibrate that potentiates statin related mylagias the most.   While his triglycerides remain elevated, TG <1000 are unlikely to be causative of pancreatitis.   - agree with repeat CT scan - discontinue gemfibrozil and replace with fenofibrate instead - start niacin - increase amitriptyline to  qhs  - discontinue dicyclomine (bentyl) and substitute with hyoscyamine (levsin) SL q6h prn abdominal pain - low fat diet - we discussed the importance of strict alcohol abstinence - as long as patient is tolerating oral intake and is able to manage the pain without significant amounts of narcotics he is safe for discharge - outpatient pancreatology follow up, he will likely need uptitration of his TCA - repeat lipid panel in 1 month as an outpatient  Thank you for involving me in the care of this patient.     LOS: 9 days   Bluford Kaufmann, MD  09/29/2016, 2:14 PM

## 2016-09-30 LAB — GLUCOSE, CAPILLARY
Glucose-Capillary: 158 mg/dL — ABNORMAL HIGH (ref 65–99)
Glucose-Capillary: 189 mg/dL — ABNORMAL HIGH (ref 65–99)

## 2016-09-30 LAB — TRIGLYCERIDES: TRIGLYCERIDES: 642 mg/dL — AB (ref <150)

## 2016-09-30 MED ORDER — NIACIN 100 MG PO TABS: 100.0000 mg | ORAL_TABLET | Freq: Every day | ORAL | 0 refills | Status: DC

## 2016-09-30 MED ORDER — FENOFIBRATE 160 MG PO TABS
160.0000 mg | ORAL_TABLET | Freq: Every day | ORAL | Status: DC
  Administered 2016-09-30: 160 mg via ORAL
  Filled 2016-09-30: qty 1

## 2016-09-30 MED ORDER — NIACIN 100 MG PO TABS
100.0000 mg | ORAL_TABLET | Freq: Every day | ORAL | Status: DC
Start: 2016-09-30 — End: 2016-09-30
  Filled 2016-09-30: qty 1

## 2016-09-30 MED ORDER — AMITRIPTYLINE HCL 75 MG PO TABS: 75.0000 mg | ORAL_TABLET | Freq: Every day | ORAL | 0 refills | Status: DC

## 2016-09-30 MED ORDER — FENOFIBRATE 160 MG PO TABS: 160.0000 mg | ORAL_TABLET | Freq: Every day | ORAL | 0 refills | Status: DC

## 2016-09-30 MED ORDER — OXYCODONE HCL 5 MG PO TABS: 5.0000 mg | ORAL_TABLET | ORAL | 0 refills | Status: DC | PRN

## 2016-09-30 MED ORDER — THIAMINE HCL 100 MG PO TABS: 100.0000 mg | ORAL_TABLET | Freq: Every day | ORAL | Status: DC

## 2016-09-30 MED ORDER — ADULT MULTIVITAMIN W/MINERALS CH
1.0000 | ORAL_TABLET | Freq: Every day | ORAL | Status: DC
Start: 2016-10-01 — End: 2023-09-11

## 2016-09-30 MED ORDER — HYOSCYAMINE SULFATE 0.125 MG PO TABS: 0.1250 mg | ORAL_TABLET | Freq: Four times a day (QID) | ORAL | 0 refills | Status: DC | PRN

## 2016-09-30 MED ORDER — DOCUSATE SODIUM 100 MG PO CAPS: 100.0000 mg | ORAL_CAPSULE | Freq: Two times a day (BID) | ORAL | 0 refills | Status: DC | PRN

## 2016-09-30 MED ORDER — NICOTINE 21 MG/24HR TD PT24: 21.0000 mg | MEDICATED_PATCH | Freq: Every day | TRANSDERMAL | 0 refills | Status: DC

## 2016-09-30 MED ORDER — INSULIN NPH (HUMAN) (ISOPHANE) 100 UNIT/ML ~~LOC~~ SUSP: 10.0000 [IU] | Freq: Every day | SUBCUTANEOUS | 1 refills | Status: DC

## 2016-09-30 MED ORDER — INSULIN REGULAR HUMAN 100 UNIT/ML IJ SOLN: 7.0000 [IU] | Freq: Three times a day (TID) | INTRAMUSCULAR | 11 refills | Status: DC

## 2016-09-30 NOTE — Discharge Summary (Addendum)
[facility] - [place] at [facility]   PATIENT NAME: Donald Hart    MR#:  [id number]  DATE OF BIRTH:  1970/08/25  DATE OF ADMISSION:  09/20/2016 ADMITTING PHYSICIAN: Arnaldo Natal, MD  DATE OF DISCHARGE: 09/30/2016 PRIMARY CARE PHYSICIAN: [name], MD    ADMISSION DIAGNOSIS:  Hyponatremia [E87.1] Epigastric pain [R10.13] Hyperglycemia [R73.9] Atypical chest pain [R07.89] Acute epigastric pain [R10.13]  DISCHARGE DIAGNOSIS:  Acute on chronic pancreatitis Hypertriglyceridemia Alcohol abuse Diabetes mellitus insulin requiring  SECONDARY DIAGNOSIS:   Past Medical History:  Diagnosis Date  . Acute pancreatitis 07/2015   hospitalization at [facility]  . Alcohol abuse   . Chronic back pain   . Family history of diabetes mellitus    sister  . Family history of heart attack    Father.   . Family history of stomach cancer   . Gout   . Hypertension   . Hypertriglyceridemia   . Type 2 diabetes mellitus (HCC) 07/2015    HOSPITAL COURSE:   HPI: The patient with past medical history of hyperlipidemia, diabetes mellitus and alcohol abuse presents to the emergency department complaining of abdominal pain. The patient states that he awoke this morning with excruciating centralized and upper abdominal pain. He had 1 episode of nonbloody nonbilious emesis. He attempted to go to work where he had labs drawn for an employee health assessment which showed elevated lipids and hyperglycemia. His blood pressure was also significantly elevated which prompted him to seek evaluation in the emergency department. Initial laboratory evaluation was incomplete due to inability to run the basic metabolic panel due to elevated triglycerides. Eventually he was found to have hyponatremia as well as hypertriglyceridemia. Leukocytosis is also present. CT of the abdomen showed some peri-pancreatic stranding as well as hepatic steatosis. Right upper quadrant ultrasound showed  normal gallbladder but also demonstrated possible hepatic nodules. Due to intractable pain and leukocytosis emergency [place] staff called the hospitalist service for management of acute on chronic pancreatitis.  Hospital course   # Pancreatitis: Acute on chronic; secondary to hypertriglyceridemia,Worsened by alcohol abuse not sure if he has any component of genetic predisposition -Clinically better , patient's abdominal pain improved with the hyoscyamine discontinued Bentyl . Lipase is 17 -Repeat CT abdomen and pelvis stable  lipase is normal but pain and etiology of hypertriglyceridemia consistent with pancreatitis.  -Tolerating diet  diabetic and cardiac diet Renal function and calcium are normal. -Bentyl has been changed to hyosciamine -Amitriptyline dose increased to 75 mg -Outpatient follow-up with the [facility] are [facility] pancreologist is recommended -Appreciate GI recommendations Oxycodone as needed  #. Hypertriglyceridemia: Normal total cholesterol. Continue statin therapy as well as gemfibrozil Triglycerides [phone], insulin drip discontinued-[phone] Discontinued insulin drip on March 30 of and triglycerides are at 524 patient on IV fluids , triglycerides are at 647 patient is refusing insulin drip Continue Lipitor and gemfibrozil changed to fenofibrate , and omega-3 fatty acids. niacin is added to the regimen Outpatient follow-up with the patient's primary endocrinologist DR.Abisogun Follow-up with cardiology Discontinued IV Dilaudid and continue oxycodone as needed   # Diabetes mellitus type 2: Most recent hemoglobin A1c 7.1;  continue insulin drip per pharmacy Monitor sugars. Repeat hemoglobin A1c 9.9  patient on Lantus 10 units daily and Novolin 7 units 3 times a day with meals during the hospital course Resume NPH, dose changed to 10 units once daily. Continue  7 units 3 times a day   # . Hypertension blood pressure is elevated  from  the pain intermitently Improved now Continue losartan , amlodipine and Coreg and titrate as needed   #Constipation laxatives Out of bed as tolerated Transfer to floor  . Tobacco abuse: Counseled patient to quit smoking for 5 minutes. Patient is agreeable. on NicoDerm patch  #. History of alcohol abuse - CIWA protocol   DVT prophylaxis: Lovenox . GI prophylaxis: Pantoprazole  PT- no PT needs identified    DISCHARGE CONDITIONS:   stable  CONSULTS OBTAINED:  Treatment Team:  Yvonne Kendall, MD Bluford Kaufmann, MD   PROCEDURES  None   DRUG ALLERGIES:  No Known Allergies  DISCHARGE MEDICATIONS:   Current Discharge Medication List    START taking these medications   Details  docusate sodium (COLACE) 100 MG capsule Take 1 capsule (100 mg total) by mouth 2 (two) times daily as needed for mild constipation. Qty: 10 capsule, Refills: 0    fenofibrate 160 MG tablet Take 1 tablet (160 mg total) by mouth daily. Qty: 30 tablet, Refills: 0    hyoscyamine (LEVSIN, ANASPAZ) 0.125 MG tablet Take 1 tablet (0.125 mg total) by mouth every 6 (six) hours as needed for cramping. Qty: 30 tablet, Refills: 0    Multiple Vitamin (MULTIVITAMIN WITH MINERALS) TABS tablet Take 1 tablet by mouth daily.    niacin 100 MG tablet Take 1 tablet (100 mg total) by mouth at bedtime. Qty: 30 tablet, Refills: 0    nicotine (NICODERM CQ - DOSED IN MG/24 HOURS) 21 mg/24hr patch Place 1 patch (21 mg total) onto the skin daily. Qty: 28 patch, Refills: 0    oxyCODONE (OXY IR/ROXICODONE) 5 MG immediate release tablet Take 1 tablet (5 mg total) by mouth every 4 (four) hours as needed for moderate pain or breakthrough pain (mod pain). Qty: 30 tablet, Refills: 0    thiamine 100 MG tablet Take 1 tablet (100 mg total) by mouth daily.      CONTINUE these medications which have CHANGED   Details  amitriptyline (ELAVIL) 75 MG tablet Take 1 tablet (75 mg total) by mouth at bedtime. Qty: 30 tablet,  Refills: 0    insulin NPH Human (HUMULIN N,NOVOLIN N) 100 UNIT/ML injection Inject 0.1 mLs (10 Units total) into the skin at bedtime. Reported on 07/21/2015 Qty: 100 mL, Refills: 1    insulin regular (NOVOLIN R,HUMULIN R) 250 units/2.42mL (100 units/mL) injection Inject 0.07 mLs (7 Units total) into the skin 3 (three) times daily before meals. Qty: 10 mL, Refills: 11      CONTINUE these medications which have NOT CHANGED   Details  albuterol (PROVENTIL HFA;VENTOLIN HFA) 108 (90 Base) MCG/ACT inhaler Inhale 2 puffs into the lungs every 6 (six) hours as needed for wheezing or shortness of breath. Qty: 1 Inhaler, Refills: 2    allopurinol (ZYLOPRIM) 100 MG tablet Take 2 tablets by mouth daily. Refills: 0    amLODipine (NORVASC) 10 MG tablet Take 1 tablet by mouth daily. Refills: 0    atorvastatin (LIPITOR) 40 MG tablet Take 40 mg by mouth daily. Reported on 07/21/2015    carvedilol (COREG) 6.25 MG tablet Take 1 tablet by mouth 2 (two) times daily. Refills: 11    colchicine 0.6 MG tablet Take 0.6 mg by mouth daily as needed.    folic acid (FOLVITE) 1 MG tablet Take 1 mg by mouth daily.    gabapentin (NEURONTIN) 300 MG capsule Take 300 mg by mouth 3 (three) times daily.    losartan (COZAAR) 100 MG tablet Take 100 mg by mouth daily.  magnesium oxide (MAG-OX) 400 MG tablet Take 400 mg by mouth daily.    Melatonin 3 MG TABS Take 6 mg by mouth at bedtime.    omega-3 acid ethyl esters (LOVAZA) 1 g capsule Take 2 g by mouth 2 (two) times daily.    pantoprazole (PROTONIX) 40 MG tablet Take 40 mg by mouth daily.    guaiFENesin-dextromethorphan (ROBITUSSIN DM) 100-10 MG/5ML syrup Take 10 mLs by mouth every 6 (six) hours as needed for cough. Qty: 118 mL, Refills: 0    menthol-cetylpyridinium (CEPACOL) 3 MG lozenge Take 1 lozenge (3 mg total) by mouth as needed for sore throat. Qty: 100 tablet, Refills: 12      STOP taking these medications     diclofenac sodium (VOLTAREN) 1 % GEL       dicyclomine (BENTYL) 10 MG capsule      gemfibrozil (LOPID) 600 MG tablet      ibuprofen (ADVIL,MOTRIN) 200 MG tablet      clindamycin (CLEOCIN) 300 MG capsule      methylPREDNISolone (MEDROL) 4 MG TBPK tablet      oxyCODONE-acetaminophen (PERCOCET) 5-325 MG tablet          DISCHARGE INSTRUCTIONS:   Follow-up with primary care physician in a week Follow-up with endocrinologist in a week Follow-up with [facility]  Or [facility] pancreas specialist in a month Stop drinking alcoho  DIET:  Diabetic diet, low fat  DISCHARGE CONDITION:  Stable  ACTIVITY:  Activity as tolerated  OXYGEN:  Home Oxygen: No.   Oxygen Delivery: room air  DISCHARGE LOCATION:  home   If you experience worsening of your admission symptoms, develop shortness of breath, life threatening emergency, suicidal or homicidal thoughts you must seek medical attention immediately by calling 911 or calling your MD immediately  if symptoms less severe.  You Must read complete instructions/literature along with all the possible adverse reactions/side effects for all the Medicines you take and that have been prescribed to you. Take any new Medicines after you have completely understood and accpet all the possible adverse reactions/side effects.   Please note  You were cared for by a hospitalist during your hospital stay. If you have any questions about your discharge medications or the care you received while you were in the hospital after you are discharged, you can call the unit and asked to speak with the hospitalist on call if the hospitalist that took care of you is not available. Once you are discharged, your primary care physician will handle any further medical issues. Please note that NO REFILLS for any discharge medications will be authorized once you are discharged, as it is imperative that you return to your primary care physician (or establish a relationship with a primary care physician if you do not have one)  for your aftercare needs so that they can reassess your need for medications and monitor your lab values.     Today  Chief Complaint  Patient presents with  . Chest Pain  . [name]  . Dizziness   Patient is feeling better her bowel movements and wants to go home  ROS:  CONSTITUTIONAL: Denies fevers, chills. Denies any fatigue, weakness.  EYES: Denies blurry vision, double vision, eye pain. EARS, NOSE, THROAT: Denies tinnitus, ear pain, hearing loss. RESPIRATORY: Denies cough, wheeze, shortness of breath.  CARDIOVASCULAR: Denies chest pain, palpitations, edema.  GASTROINTESTINAL: Denies nausea, vomiting, diarrhea, abdominal pain. Denies bright red blood per rectum. GENITOURINARY: Denies dysuria, hematuria. ENDOCRINE: Denies nocturia or thyroid problems.  HEMATOLOGIC AND LYMPHATIC: Denies easy bruising or bleeding. SKIN: Denies rash or lesion. MUSCULOSKELETAL: Denies pain in neck, back, shoulder, knees, hips or arthritic symptoms.  NEUROLOGIC: Denies paralysis, paresthesias.  PSYCHIATRIC: Denies anxiety or depressive symptoms.   VITAL SIGNS:  Blood pressure 122/74, pulse 64, temperature 98.6 F (37 C), temperature source Oral, resp. rate 18, height  (1.905 m), weight 116.8 kg (257 lb 8 oz), SpO2 98 %.  I/O:    Intake/Output Summary (Last 24 hours) at 09/30/16 1308 Last data filed at 09/30/16 0947  Gross per 24 hour  Intake              600 ml  Output                0 ml  Net              600 ml    PHYSICAL EXAMINATION:  GENERAL:  46 y.o.-year-old patient lying in the bed with no acute distress.  EYES: Pupils equal, round, [name], normocephalic. Oropharynx and nasopharynx clear.  NECK:  Supple, [name], [name], no wheezing, rales,rhonchi or crepitation. No use  of accessory muscles of respiration.  CARDIOVASCULAR: S1, [name], rubs, [name], non-tender, non-distended. Bowel sounds present. No organomegaly or mass.  EXTREMITIES: No pedal edema, cyanosis, or clubbing.  NEUROLOGIC: Cranial nerves II through XII are intact. Muscle strength 5/5 in all extremities. Sensation intact. Gait not checked.  PSYCHIATRIC: The patient is alert and oriented x 3.  SKIN: No obvious rash, lesion, or ulcer.   DATA REVIEW:   CBC  Recent Labs Lab 09/25/16 0519  WBC 7.2  HGB 13.9  HCT 39.8*  PLT 197    Chemistries   Recent Labs Lab 09/26/16 0429  09/29/16 0430  NA 138  < > 135  K 3.7  < > 4.7  CL 103  < > 100*  CO2 28  < > 28  GLUCOSE 78  < > 165*  BUN 5*  < > 14  CREATININE 0.65  < > 0.78  CALCIUM 8.9  < > 9.5  MG 1.9  --   --   AST  --   < > 40  ALT  --   < > 43  ALKPHOS  --   < > 72  BILITOT  --   < > 0.8  < > = values in this interval not displayed.  Cardiac Enzymes No results for input(s): TROPONINI in the last 168 hours.  Microbiology Results  Results for orders placed or performed during the hospital encounter of 09/20/16  Culture, blood (routine x 2)     Status: None   Collection Time: 09/20/16  6:24 PM  Result Value Ref Range Status   Specimen Description BLOOD RIGHT [employer] [facility]  Final   Special Requests   Final    BOTTLES DRAWN AEROBIC AND ANAEROBIC Blood Culture adequate volume   Culture NO GROWTH 5 DAYS  Final   Report Status 09/25/2016 FINAL  Final  Culture, blood (routine x 2)     Status: None   Collection Time: 09/20/16  6:24 PM  Result Value Ref Range Status   Specimen Description BLOOD RIGHT HAND  Final   Special Requests   Final    BOTTLES DRAWN AEROBIC AND ANAEROBIC Blood Culture adequate volume  Culture NO GROWTH 5 DAYS  Final   Report Status 09/25/2016 FINAL  Final  MRSA PCR Screening     Status: None   Collection Time: 09/21/16 10:44 AM  Result Value Ref Range Status   MRSA by PCR  NEGATIVE NEGATIVE Final    Comment:        The GeneXpert MRSA Assay (FDA approved for NASAL specimens only), is one component of a comprehensive MRSA colonization surveillance program. It is not intended to diagnose MRSA infection nor to guide or monitor treatment for MRSA infections.     RADIOLOGY:  Ct Abdomen Pelvis W Contrast  Result Date: 09/29/2016 CLINICAL DATA:  Epigastric abdominal pain.  Smoker. EXAM: CT ABDOMEN AND PELVIS WITH CONTRAST TECHNIQUE: Multidetector CT imaging of the abdomen and pelvis was performed using the standard protocol following bolus administration of intravenous contrast. CONTRAST:  ISOVUE-300 IOPAMIDOL (ISOVUE-300) INJECTION 61% COMPARISON:  09/20/2016. FINDINGS: Lower chest: Clear lung bases. Hepatobiliary: Mild diffuse low density of the liver relative to the spleen. Normal appearing gallbladder. Pancreas: Stable 1.2 cm oval area of low density in the pancreatic head, also stable compared to previous studies previously. The pancreatic margins at the pancreatic head are somewhat ill-defined, with improvement. There is also less surrounding soft tissue stranding. Spleen: Normal in size without focal abnormality. Adrenals/Urinary Tract: Stable right renal cysts. Normal appearing adrenal glands, left kidney, ureters and urinary bladder. Stomach/Bowel: Interval small amount of high density contrast in the appendix without evidence of appendicitis. Normal appearing stomach, small bowel and colon. Vascular/Lymphatic: Atheromatous arterial calcifications, including the abdominal aorta. No aneurysm. Mildly prominent gastrohepatic ligament lymph nodes without abnormal enlargement, without significant change. Reproductive: Normal sized prostate gland. Other: Bilateral inguinal hernias containing fat. Musculoskeletal: Lumbar and lower thoracic spine degenerative changes. IMPRESSION: 1. Improved appearance of pancreatitis involving the head of the pancreas with minimal  residual peripancreatic soft tissue stranding and stable small oval area of associated low density in the pancreatic head without a well-formed pseudocyst. 2. Mild diffuse hepatic steatosis. 3. Aortic atherosclerosis. 4. Stable mildly prominent reactive upper abdominal lymph nodes. 5. Bilateral inguinal hernias containing fat. Electronically Signed   By: Beckie Salts M.D.   On: 09/29/2016 14:52    EKG:   Orders placed or performed during the hospital encounter of 09/20/16  . EKG 12-Lead  . EKG 12-Lead  . ED EKG within 10 minutes  . ED EKG within 10 minutes      Management plans discussed with the patient, family and they are in agreement.  CODE STATUS:     Code Status Orders        Start     Ordered   09/20/16 2226  Full code  Continuous     09/20/16 2225    Code Status History    Date Active Date Inactive Code Status Order ID Comments User Context   07/12/2016  7:54 PM 07/15/2016  9:48 PM Full Code [id number]  Auburn Bilberry, MD Inpatient   07/03/2015  2:31 PM 07/09/2015  5:55 PM Full Code [id number]  Alford Highland, MD ED   01/19/2015  6:50 PM 01/21/2015  1:37 PM Full Code [id number]  Shaune Pollack, MD Inpatient      TOTAL TIME TAKING CARE OF THIS PATIENT: 45  minutes.   Note: This dictation was prepared with Dragon dictation along with smaller phrase technology. Any transcriptional errors that result from this process are unintentional.   @  on 09/30/2016 at 1:08 PM  Between 7am to 6pm -  Pager - [phone]  After 6pm go to [email or web address] - password [email or web address] [facility]  [place] [facility]  Office  [phone]  CC: Primary care physician; [name], MD

## 2016-09-30 NOTE — Progress Notes (Addendum)
[facility]         [place], [place].   09/30/2016  Patient: Donald Hart   Date of Birth:  1970/12/15  Date of admission:  09/20/2016  Date of Discharge  09/30/2016    To Whom it May Concern:   Raffael Bugarin  may return to work on 10/07/16.  PHYSICAL ACTIVITY:  Full   If you have any questions or concerns, please don't hesitate to call.  Sincerely,   Ramonita Lab M.D Pager [phone] Office : [phone]   .

## 2016-09-30 NOTE — Discharge Instructions (Signed)
Follow-up with primary care physician in a week Follow-up with endocrinologist in a week Follow-up with [facility]  Or [facility] pancreas specialist in a month Stop drinking alcohol

## 2016-09-30 NOTE — Discharge Planning (Signed)
Patient tele and picc removed per orders.  Discharge papers given, explained and educated. Informed of suggested FU appts and appts made.  1st time [facility] pancreatic clinic visit requested via phone and faxed needed information.  [facility] states they will contact patient to set up appt once records are reviewed. Scripts given and signed.  RN assessment and VS revealed stability for discharge.  Once ready, will be wheeled to front and family transporting home via car.

## 2016-09-30 NOTE — Care Management Note (Signed)
Case Management Note  Patient Details  Name: Donald Hart MRN: [id number] Date of Birth: [birth year]  Subjective/Objective:  Chart reviewed for discharge needs. Patient independent, active and works. Pt recommended no follow up. PCP is Dr. Zada Finders. No needs identified.                  Action/Plan:   Expected Discharge Date:  09/30/16               Expected Discharge Plan:  Home/Self Care  In-House Referral:     Discharge planning Services     Post Acute Care Choice:    Choice offered to:     DME Arranged:    DME Agency:     HH Arranged:    HH Agency:     Status of Service:  Completed, signed off  If discussed at [employer] of Stay Meetings, dates discussed:    Additional Comments:  Marily Memos, RN 09/30/2016, 9:15 AM

## 2016-11-14 DIAGNOSIS — E1142 Type 2 diabetes mellitus with diabetic polyneuropathy: Secondary | ICD-10-CM | POA: Insufficient documentation

## 2016-11-14 DIAGNOSIS — R5382 Chronic fatigue, unspecified: Secondary | ICD-10-CM | POA: Insufficient documentation

## 2016-11-14 DIAGNOSIS — G479 Sleep disorder, unspecified: Secondary | ICD-10-CM | POA: Insufficient documentation

## 2017-03-12 DIAGNOSIS — R748 Abnormal levels of other serum enzymes: Secondary | ICD-10-CM | POA: Insufficient documentation

## 2017-04-04 ENCOUNTER — Emergency Department
Admission: EM | Admit: 2017-04-04 | Discharge: 2017-04-04 | Disposition: A | Discharge status: Home / Self Care | Payer: BLUE CROSS/BLUE SHIELD | Attending: Emergency Medicine | Admitting: Emergency Medicine

## 2017-04-04 ENCOUNTER — Encounter: Payer: Self-pay | Admitting: Emergency Medicine

## 2017-04-04 ENCOUNTER — Emergency Department: Payer: BLUE CROSS/BLUE SHIELD

## 2017-04-04 DIAGNOSIS — Z794 Long term (current) use of insulin: Secondary | ICD-10-CM | POA: Diagnosis not present

## 2017-04-04 DIAGNOSIS — Z79899 Other long term (current) drug therapy: Secondary | ICD-10-CM | POA: Insufficient documentation

## 2017-04-04 DIAGNOSIS — R1010 Upper abdominal pain, unspecified: Secondary | ICD-10-CM | POA: Diagnosis present

## 2017-04-04 DIAGNOSIS — F17210 Nicotine dependence, cigarettes, uncomplicated: Secondary | ICD-10-CM | POA: Insufficient documentation

## 2017-04-04 DIAGNOSIS — Z8719 Personal history of other diseases of the digestive system: Secondary | ICD-10-CM | POA: Insufficient documentation

## 2017-04-04 DIAGNOSIS — E119 Type 2 diabetes mellitus without complications: Secondary | ICD-10-CM | POA: Diagnosis not present

## 2017-04-04 DIAGNOSIS — I10 Essential (primary) hypertension: Secondary | ICD-10-CM | POA: Diagnosis not present

## 2017-04-04 DIAGNOSIS — R1011 Right upper quadrant pain: Secondary | ICD-10-CM | POA: Insufficient documentation

## 2017-04-04 LAB — CBC
HCT: 42.7 % (ref 40.0–52.0)
Hemoglobin: 15.5 g/dL (ref 13.0–18.0)
MCH: 33 pg (ref 26.0–34.0)
MCHC: 36.4 g/dL — AB (ref 32.0–36.0)
MCV: 90.7 fL (ref 80.0–100.0)
Platelets: 185 10*3/uL (ref 150–440)
RBC: 4.71 MIL/uL (ref 4.40–5.90)
RDW: 12.9 % (ref 11.5–14.5)
WBC: 12.2 10*3/uL — ABNORMAL HIGH (ref 3.8–10.6)

## 2017-04-04 LAB — COMPREHENSIVE METABOLIC PANEL
ALT: 39 U/L (ref 17–63)
AST: 39 U/L (ref 15–41)
Albumin: 3.7 g/dL (ref 3.5–5.0)
Alkaline Phosphatase: 76 U/L (ref 38–126)
Anion gap: 12 (ref 5–15)
BUN: 8 mg/dL (ref 6–20)
CHLORIDE: 97 mmol/L — AB (ref 101–111)
CO2: 23 mmol/L (ref 22–32)
CREATININE: 0.78 mg/dL (ref 0.61–1.24)
Calcium: 8.9 mg/dL (ref 8.9–10.3)
GFR calc Af Amer: >60 mL/min (ref >60)
GLUCOSE: 295 mg/dL — AB (ref 65–99)
Potassium: 3.4 mmol/L — ABNORMAL LOW (ref 3.5–5.1)
SODIUM: 132 mmol/L — AB (ref 135–145)
Total Bilirubin: 0.8 mg/dL (ref 0.3–1.2)
Total Protein: 7.1 g/dL (ref 6.5–8.1)

## 2017-04-04 LAB — LIPASE, BLOOD: LIPASE: 42 U/L (ref 11–51)

## 2017-04-04 LAB — TROPONIN I

## 2017-04-04 MED ORDER — OXYCODONE-ACETAMINOPHEN 5-325 MG PO TABS
1.0000 | ORAL_TABLET | Freq: Once | ORAL | Status: AC
  Administered 2017-04-04: 1 via ORAL
  Filled 2017-04-04: qty 1

## 2017-04-04 MED ORDER — OXYCODONE-ACETAMINOPHEN 5-325 MG PO TABS: 1.0000 | ORAL_TABLET | Freq: Four times a day (QID) | ORAL | 0 refills | Status: DC | PRN

## 2017-04-04 MED ORDER — PANTOPRAZOLE SODIUM 40 MG PO TBEC: 40.0000 mg | DELAYED_RELEASE_TABLET | Freq: Every day | ORAL | 1 refills | Status: DC

## 2017-04-04 MED ORDER — HYDROMORPHONE HCL 1 MG/ML IJ SOLN
1.0000 mg | Freq: Once | INTRAMUSCULAR | Status: AC
  Administered 2017-04-04: 1 mg via INTRAMUSCULAR
  Filled 2017-04-04: qty 1

## 2017-04-04 NOTE — ED Notes (Signed)
Pt went to [facility]

## 2017-04-04 NOTE — ED Triage Notes (Signed)
Pt c/o central upper epigastric pain starting last night and worsening throughout the AM. Pt denies that pain radiates as well as N/V/D.

## 2017-04-04 NOTE — ED Provider Notes (Signed)
[facility] Emergency Department Provider Note  Time seen: 3:45 AM  I have reviewed the triage vital signs and the nursing notes.   HISTORY  Chief Complaint Abdominal Pain    HPI Donald Hart is a 46 y.o. male With a past medical history of pancreatitis, alcohol abuse, hypertension, hyperlipidemia, diabetes, presents to the emergency department with upper abdominal pain. According to the patient yesterday evening he began experiencing upper abdominal pain which has progressively worsen. Patient does admit to drinking alcohol, states a history of recurrent pancreatitis which this feels identical. Denies any lower abdominal pain. Denies any vomiting but states nausea. Denies any diarrhea. Denies dysuria. Describes his pain as 8/10 aching pain in the upper abdomen.  Past Medical History:  Diagnosis Date  . Acute pancreatitis 07/2015   hospitalization at [facility]  . Alcohol abuse   . Chronic back pain   . Family history of diabetes mellitus    sister  . Family history of heart attack    Father.   . Family history of stomach cancer   . Gout   . Hypertension   . Hypertriglyceridemia   . Type 2 diabetes mellitus (HCC) 07/2015    Patient Active Problem List   Diagnosis Date Noted  . Hypertriglyceridemia 09/25/2016  . Atypical chest pain 09/25/2016  . Liver lesion 09/25/2016  . Polysubstance abuse (HCC) 09/25/2016  . Acute on chronic pancreatitis (HCC) 09/20/2016  . Alcohol abuse   . Acute pancreatitis 07/03/2015  . Insulin dependent diabetes mellitus (HCC) 06/01/2015  . ARF (acute renal failure) (HCC) 01/19/2015  . Leukocytosis 01/19/2015    Past Surgical History:  Procedure Laterality Date  . NO PAST SURGERIES      Prior to Admission medications   Medication Sig Start Date End Date Taking? Authorizing Provider  albuterol (PROVENTIL HFA;VENTOLIN HFA) 108 (90 Base) MCG/ACT inhaler Inhale 2 puffs into the lungs every 6 (six) hours as needed for  wheezing or shortness of breath. 11/18/15   Emily Filbert, MD  allopurinol (ZYLOPRIM) 100 MG tablet Take 2 tablets by mouth daily. 05/17/15   [provider]  amitriptyline (ELAVIL) 75 MG tablet Take 1 tablet (75 mg total) by mouth at bedtime. 09/30/16   Gouru, Deanna Artis, MD  amLODipine (NORVASC) 10 MG tablet Take 1 tablet by mouth daily. 05/17/15   [provider]  atorvastatin (LIPITOR) 40 MG tablet Take 40 mg by mouth daily. Reported on 07/21/2015 07/12/15 09/20/16  [provider]  carvedilol (COREG) 6.25 MG tablet Take 1 tablet by mouth 2 (two) times daily. 05/22/15   [provider]  colchicine 0.6 MG tablet Take 0.6 mg by mouth daily as needed.    [provider]  docusate sodium (COLACE) 100 MG capsule Take 1 capsule (100 mg total) by mouth 2 (two) times daily as needed for mild constipation. 09/30/16   Ramonita Lab, MD  fenofibrate 160 MG tablet Take 1 tablet (160 mg total) by mouth daily. 10/01/16   Ramonita Lab, MD  folic acid (FOLVITE) 1 MG tablet Take 1 mg by mouth daily.    [provider]  gabapentin (NEURONTIN) 300 MG capsule Take 300 mg by mouth 3 (three) times daily.    [provider]  guaiFENesin-dextromethorphan (ROBITUSSIN DM) 100-10 MG/5ML syrup Take 10 mLs by mouth every 6 (six) hours as needed for cough. Patient not taking: Reported on 09/04/2016 07/15/16   Ramonita Lab, MD  hyoscyamine (LEVSIN, ANASPAZ) 0.125 MG tablet Take 1 tablet (0.125 mg total)  by mouth every 6 (six) hours as needed for cramping. 09/30/16   Gouru, Deanna Artis, MD  insulin NPH Human (HUMULIN N,NOVOLIN N) 100 UNIT/ML injection Inject 0.1 mLs (10 Units total) into the skin at bedtime. Reported on 07/21/2015 09/30/16 12/05/17  Ramonita Lab, MD  insulin regular (NOVOLIN R,HUMULIN R) 250 units/2.14mL (100 units/mL) injection Inject 0.07 mLs (7 Units total) into the skin 3 (three) times daily before meals. 09/30/16   Ramonita Lab, MD  losartan (COZAAR) 100 MG tablet Take  100 mg by mouth daily.    [provider]  magnesium oxide (MAG-OX) 400 MG tablet Take 400 mg by mouth daily.    [provider]  Melatonin 3 MG TABS Take 6 mg by mouth at bedtime.    [provider]  menthol-cetylpyridinium (CEPACOL) 3 MG lozenge Take 1 lozenge (3 mg total) by mouth as needed for sore throat. Patient not taking: Reported on 09/20/2016 07/15/16   Ramonita Lab, MD  Multiple Vitamin (MULTIVITAMIN WITH MINERALS) TABS tablet Take 1 tablet by mouth daily. 10/01/16   Ramonita Lab, MD  niacin 100 MG tablet Take 1 tablet (100 mg total) by mouth at bedtime. 09/30/16   Gouru, Deanna Artis, MD  nicotine (NICODERM CQ - DOSED IN MG/24 HOURS) 21 mg/24hr patch Place 1 patch (21 mg total) onto the skin daily. 10/01/16   Gouru, Deanna Artis, MD  omega-3 acid ethyl esters (LOVAZA) 1 g capsule Take 2 g by mouth 2 (two) times daily.    [provider]  oxyCODONE (OXY IR/ROXICODONE) 5 MG immediate release tablet Take 1 tablet (5 mg total) by mouth every 4 (four) hours as needed for moderate pain or breakthrough pain (mod pain). 09/30/16   Gouru, Deanna Artis, MD  pantoprazole (PROTONIX) 40 MG tablet Take 40 mg by mouth daily.    [provider]  thiamine 100 MG tablet Take 1 tablet (100 mg total) by mouth daily. 10/01/16   Ramonita Lab, MD    No Known Allergies  Family History  Problem Relation Age of Onset  . Adopted: Yes  . Heart attack Father   . Stomach cancer Father   . Diabetes Father   . Diabetes Sister   . Diabetes Sister     Social History Social History  Substance Use Topics  . Smoking status: Current Every Day Smoker    Packs/day: 1.00    Years: 20.00    Types: Cigarettes  . Smokeless tobacco: Never Used  . Alcohol use 100.8 oz/week    12 Cans of beer per week     Comment: none since January hospitalization    Review of Systems Constitutional: Negative for fever. Cardiovascular: Negative for chest pain. Respiratory: Negative for shortness of  breath. Gastrointestinal: upper abdominal pain. Positive for nausea Neurological: Negative for headache All other ROS negative  ____________________________________________   PHYSICAL EXAM:  VITAL SIGNS: ED Triage Vitals  Enc Vitals Group     BP 04/04/17 0228 (!) 164/101     Pulse Rate 04/04/17 0228 93     Resp 04/04/17 0228 18     Temp 04/04/17 0228 98.2 F (36.8 C)     Temp Source 04/04/17 0228 Oral     SpO2 04/04/17 0228 99 %     Weight 04/04/17 0226 257 lb (116.6 kg)     Height --      Head Circumference --      Peak Flow --      Pain Score 04/04/17 0317 7     Pain Loc --  Pain Edu? --      Excl. in GC? --     Constitutional: Alert and oriented. Well appearing and in no distress. Eyes: Normal exam ENT   Head: Normocephalic and atraumatic.   Mouth/Throat: Mucous membranes are moist. Cardiovascular: Normal rate, regular rhythm. No murmur Respiratory: Normal respiratory effort without tachypnea nor retractions. Breath sounds are clear  Gastrointestinal: soft, moderate epigastric and right upper quadrant tenderness palpation. No rebound or guarding. Abdomen otherwise benign. Musculoskeletal: Nontender with normal range of motion in all extremities. Neurologic:  Normal speech and language. No gross focal neurologic deficits  Skin:  Skin is warm, dry and intact.  Psychiatric: Mood and affect are normal.   ____________________________________________   EKG reviewed and interpreted by myself shows normal sinus rhythm at 94 bpm, narrow QRS, normal axis, normal intervals, no concerning ST changes  RADIOLOGY  ultrasound negative  ____________________________________________   INITIAL IMPRESSION / ASSESSMENT AND PLAN / ED COURSE  Pertinent labs & imaging results that were available during my care of the patient were reviewed by me and considered in my medical decision making (see chart for details).  patient presents to the emergency department for  epigastric right upper quadrant pain since this evening. Differential this time would include gallbladder disease, pancreatitis, gastritis, enteritis. Labs are largely at the patient's baseline, lipase is normal. LFTs are normal. Patient's tenderness appears to be more in the right upper quadrant and epigastrium, we'll. Ultrasound rule out gallbladder disease. Patient agreeable to plan. We will dose pain medication while awaiting ultrasound results.  ultrasound is negative. Highly suspect pancreatitis versus gastritis. We will send the patient home with a short course of pain medication. States his pain is improved but still present. I discussed clear liquid diet for the next 48 hours as well as using Protonix and Percocet as needed as well as over-the-counter Maalox. I discussed return precautions for any worsening pain or fever. Patient agreeable to plan.  ____________________________________________   FINAL CLINICAL IMPRESSION(S) / ED DIAGNOSES  right upper quadrant pain    Minna Antis, MD 04/04/17 [phone]

## 2017-04-04 NOTE — ED Notes (Signed)
Pt returned from Ultrasound.

## 2017-04-04 NOTE — ED Notes (Signed)

## 2017-05-05 ENCOUNTER — Encounter: Payer: Self-pay | Admitting: *Deleted

## 2017-05-05 ENCOUNTER — Ambulatory Visit
Admission: EM | Admit: 2017-05-05 | Discharge: 2017-05-05 | Disposition: A | Discharge status: Home / Self Care | Payer: BLUE CROSS/BLUE SHIELD | Attending: Family Medicine | Admitting: Family Medicine

## 2017-05-05 DIAGNOSIS — R059 Cough, unspecified: Secondary | ICD-10-CM

## 2017-05-05 DIAGNOSIS — J9801 Acute bronchospasm: Secondary | ICD-10-CM | POA: Diagnosis not present

## 2017-05-05 DIAGNOSIS — R05 Cough: Secondary | ICD-10-CM

## 2017-05-05 LAB — RAPID STREP SCREEN (MED CTR MEBANE ONLY): STREPTOCOCCUS, GROUP A SCREEN (DIRECT): NEGATIVE

## 2017-05-05 MED ORDER — BENZONATATE 200 MG PO CAPS: 200.0000 mg | ORAL_CAPSULE | Freq: Three times a day (TID) | ORAL | 0 refills | Status: DC | PRN

## 2017-05-05 MED ORDER — AZITHROMYCIN 250 MG PO TABS: ORAL_TABLET | ORAL | 0 refills | Status: DC

## 2017-05-05 MED ORDER — ALBUTEROL SULFATE HFA 108 (90 BASE) MCG/ACT IN AERS: 1.0000 | INHALATION_SPRAY | Freq: Four times a day (QID) | RESPIRATORY_TRACT | 0 refills | Status: DC | PRN

## 2017-05-05 NOTE — Discharge Instructions (Signed)
Rest, water/hydrate

## 2017-05-05 NOTE — ED Provider Notes (Signed)
[facility]    CSN: [id number] Arrival date & time: 05/05/17  [id number]     History   Chief Complaint Chief Complaint  Patient presents with  . Nasal Congestion    HPI Donald Hart is a 46 y.o. male.   The history is provided by the patient.  URI  Presenting symptoms: congestion, cough, fatigue and rhinorrhea   Severity:  Moderate Onset quality:  Sudden Duration:  5 days Timing:  Constant Progression:  Worsening Chronicity:  New Relieved by:  Nothing Ineffective treatments:  OTC medications Associated symptoms: wheezing   Risk factors: diabetes mellitus and sick contacts     Past Medical History:  Diagnosis Date  . Acute pancreatitis 07/2015   hospital[facility]  . Alcohol abuse   . Chronic back pain   . Family history of diabetes mellitus    sister  . Family history of heart attack    Father.   . Family history of stomach cancer   . Gout   . Hypertension   . Hypertriglyceridemia   . Type 2 diabetes mellitus (HCC) 07/2015    Patient Active Problem List   Diagnosis Date Noted  . Hypertriglyceridemia 09/25/2016  . Atypical chest pain 09/25/2016  . Liver lesion 09/25/2016  . Polysubstance abuse (HCC) 09/25/2016  . Acute on chronic pancreatitis (HCC) 09/20/2016  . Alcohol abuse   . Acute pancreatitis 07/03/2015  . Insulin dependent diabetes mellitus (HCC) 06/01/2015  . ARF (acute renal failure) (HCC) 01/19/2015  . Leukocytosis 01/19/2015    Past Surgical History:  Procedure Laterality Date  . NO PAST SURGERIES         Home Medications    Prior to Admission medications   Medication Sig Start Date End Date Taking? Authorizing Provider  allopurinol (ZYLOPRIM) 100 MG tablet Take 2 tablets by mouth daily. 05/17/15  Yes [provider]  amitriptyline (ELAVIL) 75 MG tablet Take 1 tablet (75 mg total) by mouth at bedtime. 09/30/16  Yes Gouru, Aruna, MD  amLODipine (NORVASC) 10 MG tablet Take 1 tablet by mouth daily.  05/17/15  Yes [provider]  carvedilol (COREG) 6.25 MG tablet Take 1 tablet by mouth 2 (two) times daily. 05/22/15  Yes [provider]  fenofibrate 160 MG tablet Take 1 tablet (160 mg total) by mouth daily. 10/01/16  Yes Gouru, Deanna ArtisAruna, MD  folic acid (FOLVITE) 1 MG tablet Take 1 mg by mouth daily.   Yes [provider]  gabapentin (NEURONTIN) 300 MG capsule Take 300 mg by mouth 3 (three) times daily.   Yes [provider]  hyoscyamine (LEVSIN, ANASPAZ) 0.125 MG tablet Take 1 tablet (0.125 mg total) by mouth every 6 (six) hours as needed for cramping. 09/30/16  Yes Gouru, Aruna, MD  insulin regular (NOVOLIN R,HUMULIN R) 250 units/2.705mL (100 units/mL) injection Inject 0.07 mLs (7 Units total) into the skin 3 (three) times daily before meals. 09/30/16  Yes Gouru, Deanna ArtisAruna, MD  losartan (COZAAR) 100 MG tablet Take 100 mg by mouth daily.   Yes [provider]  pantoprazole (PROTONIX) 40 MG tablet Take 1 tablet (40 mg total) by mouth daily. 04/04/17 04/04/18 Yes Minna AntisPaduchowski, Kevin, MD  albuterol (PROVENTIL HFA;VENTOLIN HFA) 108 (90 Base) MCG/ACT inhaler Inhale 1-2 puffs every 6 (six) hours as needed into the lungs for wheezing or shortness of breath. 05/05/17   Payton Mccallumonty, Margurite Duffy, MD  atorvastatin (LIPITOR) 40 MG tablet Take 40 mg by mouth daily. Reported on 07/21/2015 07/12/15 09/20/16  [provider]  azithromycin (ZITHROMAX Z-PAK) 250 MG tablet 2 tabs po once day 1, then 1 tab po qd for next 4 days 05/05/17   Payton Mccallum, MD  benzonatate (TESSALON) 200 MG capsule Take 1 capsule (200 mg total) 3 (three) times daily as needed by mouth. 05/05/17   Payton Mccallum, MD  colchicine 0.6 MG tablet Take 0.6 mg by mouth daily as needed.    [provider]  docusate sodium (COLACE) 100 MG capsule Take 1 capsule (100 mg total) by mouth 2 (two) times daily as needed for mild constipation. 09/30/16   Gouru, Deanna Artis, MD  guaiFENesin-dextromethorphan (ROBITUSSIN DM) 100-10  MG/5ML syrup Take 10 mLs by mouth every 6 (six) hours as needed for cough. Patient not taking: Reported on 09/04/2016 07/15/16   Ramonita Lab, MD  insulin NPH Human (HUMULIN N,NOVOLIN N) 100 UNIT/ML injection Inject 0.1 mLs (10 Units total) into the skin at bedtime. Reported on 07/21/2015 09/30/16 12/05/17  Ramonita Lab, MD  magnesium oxide (MAG-OX) 400 MG tablet Take 400 mg by mouth daily.    [provider]  Melatonin 3 MG TABS Take 6 mg by mouth at bedtime.    [provider]  menthol-cetylpyridinium (CEPACOL) 3 MG lozenge Take 1 lozenge (3 mg total) by mouth as needed for sore throat. Patient not taking: Reported on 09/20/2016 07/15/16   Ramonita Lab, MD  Multiple Vitamin (MULTIVITAMIN WITH MINERALS) TABS tablet Take 1 tablet by mouth daily. 10/01/16   Ramonita Lab, MD  niacin 100 MG tablet Take 1 tablet (100 mg total) by mouth at bedtime. 09/30/16   Gouru, Deanna Artis, MD  nicotine (NICODERM CQ - DOSED IN MG/24 HOURS) 21 mg/24hr patch Place 1 patch (21 mg total) onto the skin daily. 10/01/16   Gouru, Deanna Artis, MD  omega-3 acid ethyl esters (LOVAZA) 1 g capsule Take 2 g by mouth 2 (two) times daily.    [provider]  oxyCODONE (OXY IR/ROXICODONE) 5 MG immediate release tablet Take 1 tablet (5 mg total) by mouth every 4 (four) hours as needed for moderate pain or breakthrough pain (mod pain). 09/30/16   Gouru, Deanna Artis, MD  oxyCODONE-acetaminophen (ROXICET) 5-325 MG tablet Take 1 tablet by mouth every 6 (six) hours as needed. 04/04/17   Minna Antis, MD  thiamine 100 MG tablet Take 1 tablet (100 mg total) by mouth daily. 10/01/16   Ramonita Lab, MD    Family History Family History  Adopted: Yes  Problem Relation Age of Onset  . Heart attack Father   . Stomach cancer Father   . Diabetes Father   . Diabetes Sister   . Diabetes Sister     Social History Social History   Tobacco Use  . Smoking status: Current Every Day Smoker    Packs/day: 1.00    Years: 20.00    Pack years: 20.00      Types: Cigarettes  . Smokeless tobacco: Never Used  Substance Use Topics  . Alcohol use: Yes    Alcohol/week: 100.8 oz    Types: 12 Cans of beer per week    Comment: none since January hospitalization  . Drug use: No     Allergies   Patient has no known allergies.   Review of Systems Review of Systems  Constitutional: Positive for fatigue.  HENT: Positive for congestion and rhinorrhea.   Respiratory: Positive for cough and wheezing.      Physical Exam Triage Vital Signs ED Triage Vitals  Enc Vitals Group     BP 05/05/17 1023 120/74  Pulse Rate 05/05/17 1023 88     Resp 05/05/17 1023 12     Temp 05/05/17 1023 98.5 F (36.9 C)     Temp Source 05/05/17 1023 Oral     SpO2 05/05/17 1023 95 %     Weight 05/05/17 1024 245 lb (111.1 kg)     Height 05/05/17 1024 6\' 1"  (1.854 m)     Head Circumference --      Peak Flow --      Pain Score 05/05/17 1024 8     Pain Loc --      Pain Edu? --      Excl. in GC? --    No data found.  Updated Vital Signs BP 120/74 (BP Location: Left Arm)   Pulse 88   Temp 98.5 F (36.9 C) (Oral)   Resp 12   Ht 6\' 1"  (1.854 m)   Wt 245 lb (111.1 kg)   SpO2 95%   BMI 32.32 kg/m   Visual Acuity Right Eye Distance:   Left Eye Distance:   Bilateral Distance:    Right Eye Near:   Left Eye Near:    Bilateral Near:     Physical Exam  Constitutional: He appears well-developed and well-nourished. No distress.  HENT:  Head: Normocephalic and atraumatic.  Right Ear: Tympanic membrane, external ear and ear canal normal.  Left Ear: Tympanic membrane, external ear and ear canal normal.  Nose: Nose normal.  Mouth/Throat: Uvula is midline, oropharynx is clear and moist and mucous membranes are normal. No oropharyngeal exudate or tonsillar abscesses.  Eyes: Conjunctivae and EOM are normal. Pupils are equal, round, and reactive to light. Right eye exhibits no discharge. Left eye exhibits no discharge. No scleral icterus.  Neck: Normal  range of motion. Neck supple. No tracheal deviation present. No thyromegaly present.  Cardiovascular: Normal rate, regular rhythm and normal heart sounds.  Pulmonary/Chest: Effort normal. No stridor. No respiratory distress. He has wheezes (right; and rhonchi). He has no rales. He exhibits no tenderness.  Lymphadenopathy:    He has no cervical adenopathy.  Neurological: He is alert.  Skin: Skin is warm and dry. No rash noted. He is not diaphoretic.  Nursing note and vitals reviewed.    UC Treatments / Results  Labs (all labs ordered are listed, but only abnormal results are displayed) Labs Reviewed  RAPID STREP SCREEN (NOT AT [facility]  CULTURE, GROUP A STREP [facility])    EKG  EKG Interpretation None       Radiology No results found.  Procedures Procedures (including critical care time)  Medications Ordered in UC Medications - No data to display   Initial Impression / Assessment and Plan / UC Course  I have reviewed the triage vital signs and the nursing notes.  Pertinent labs & imaging results that were available during my care of the patient were reviewed by me and considered in my medical decision making (see chart for details).       Final Clinical Impressions(s) / UC Diagnoses   Final diagnoses:  Cough  Bronchospasm   New Prescriptions This SmartLink is deprecated. Use AVSMEDLIST instead to display the medication list for a patient.  1. Labs/x-ray results and diagnosis reviewed with patient/parent/guardian/family 2. rx as per orders above; reviewed possible side effects, interactions, risks and benefits  3. Recommend supportive treatment with  4. Follow-up prn if symptoms worsen or don't improve  Controlled Substance Prescriptions [place] Controlled Substance Registry consulted? Not Applicable   Payton Mccallum, MD 05/05/17  1324  

## 2017-05-05 NOTE — ED Triage Notes (Signed)
C/o running nose, body ache, headache. Symptoms started last friday

## 2017-05-08 LAB — CULTURE, GROUP A STREP (THRC)

## 2017-07-23 IMAGING — US US ABDOMEN LIMITED
1 series · 14 of 25 positions shown · non-contrast
Comparison: Abdominal of are 08/20/2016

CLINICAL DATA: Patient with epigastric pain.

EXAM:
US ABDOMEN LIMITED - RIGHT UPPER QUADRANT

[Series 1: us abdomen limited · 0.26mm/px · 14 of 51 slices shown]
[im 1/51]
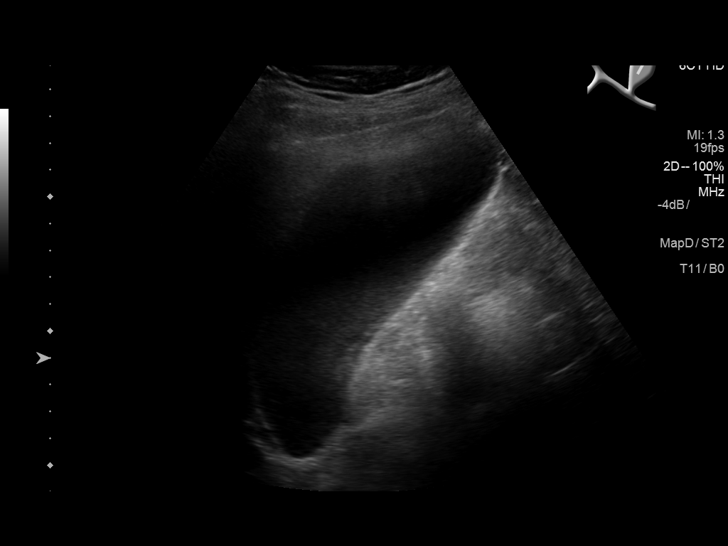
[im 5/51]
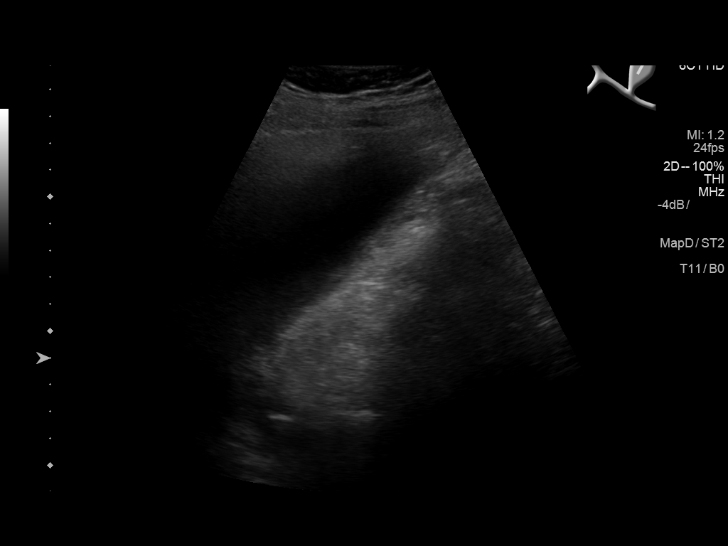
[im 9/51]
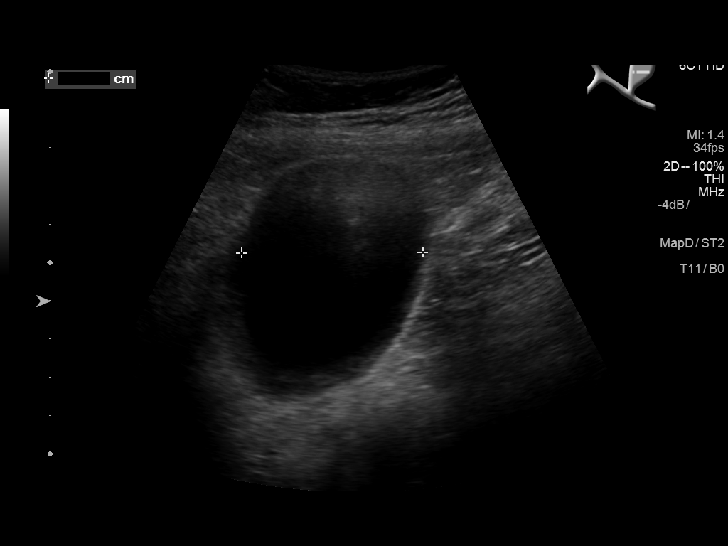
[im 13/51]
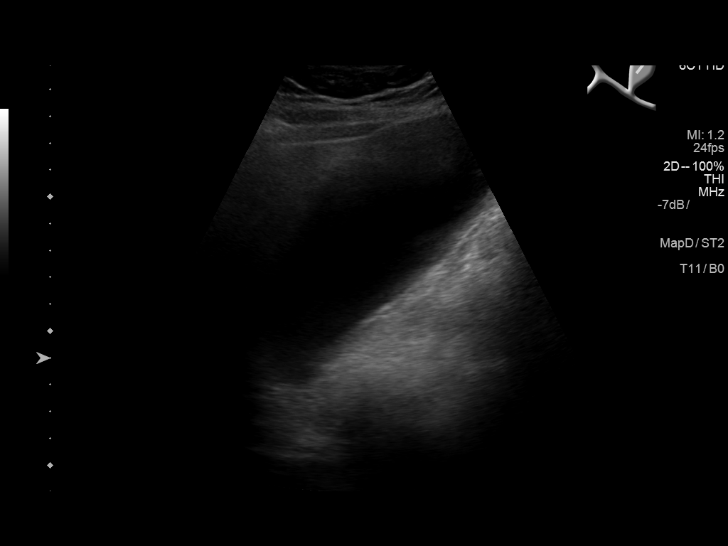
[im 17/51]
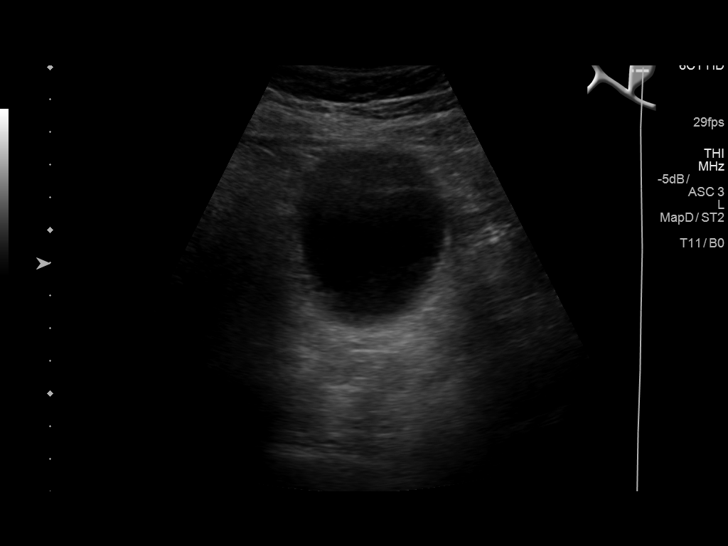
[im 19/51]
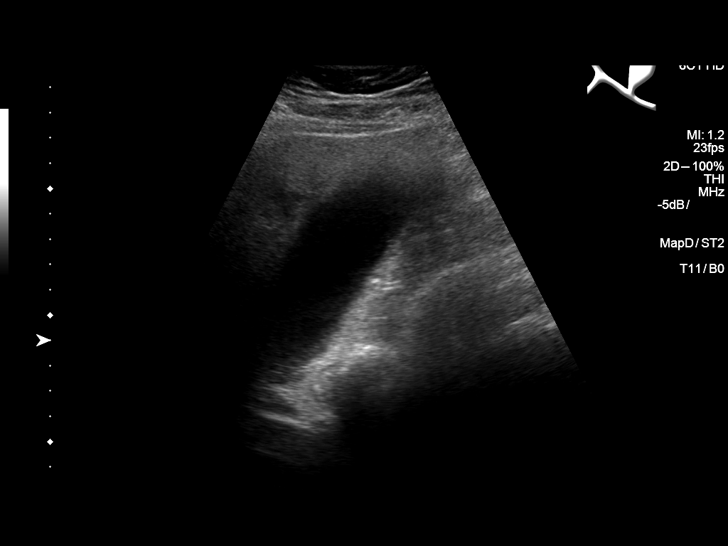
[im 23/51]
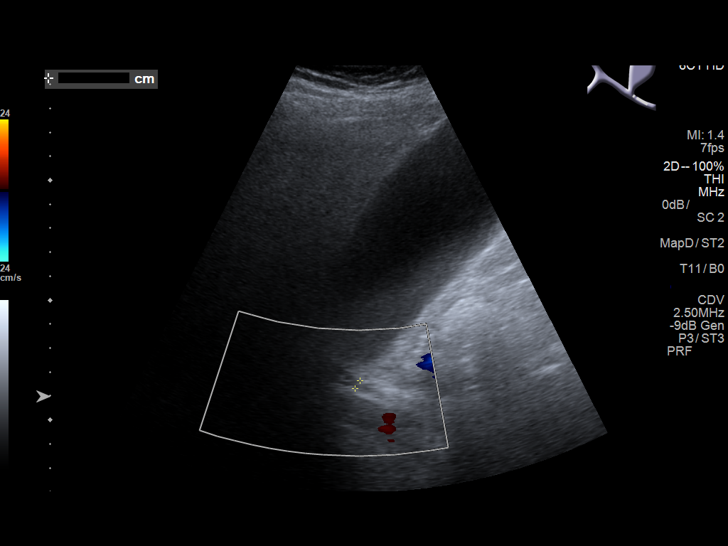
[im 28/51]
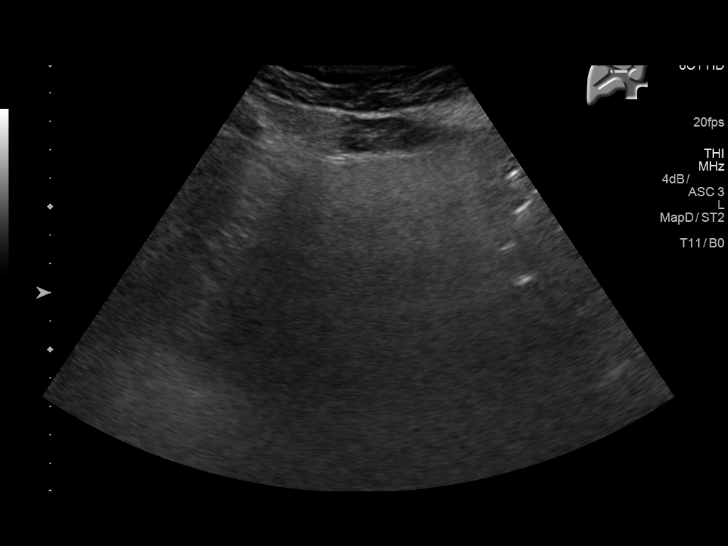
[im 32/51]
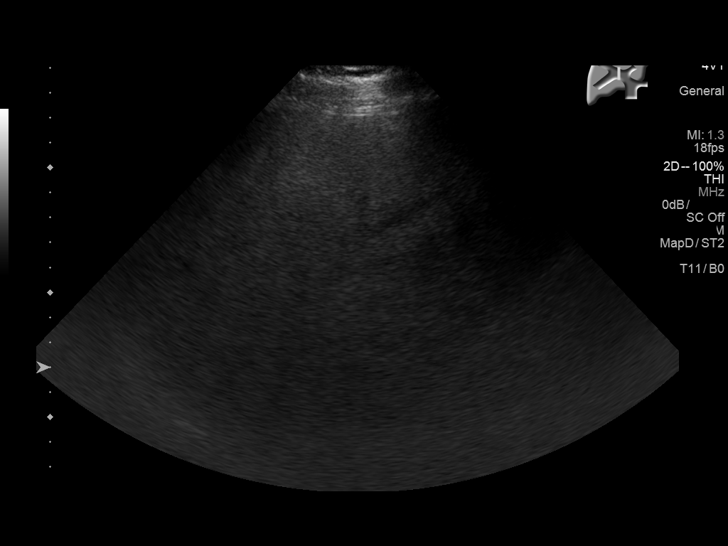
[im 34/51]
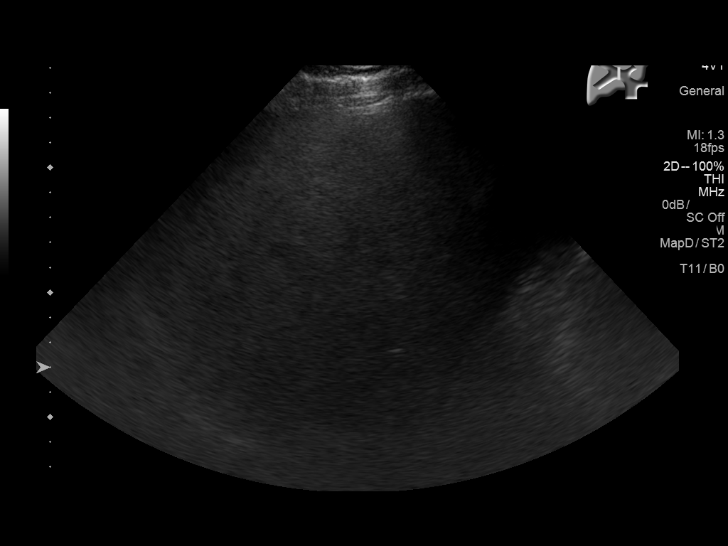
[im 38/51]
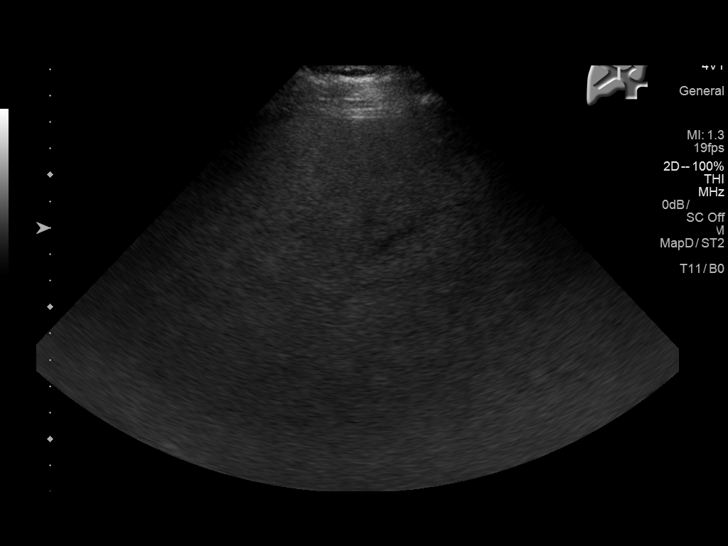
[im 42/51]
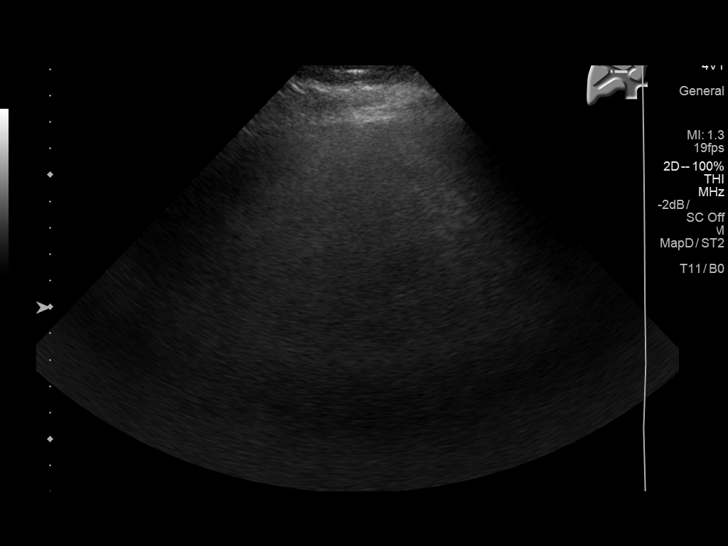
[im 46/51]
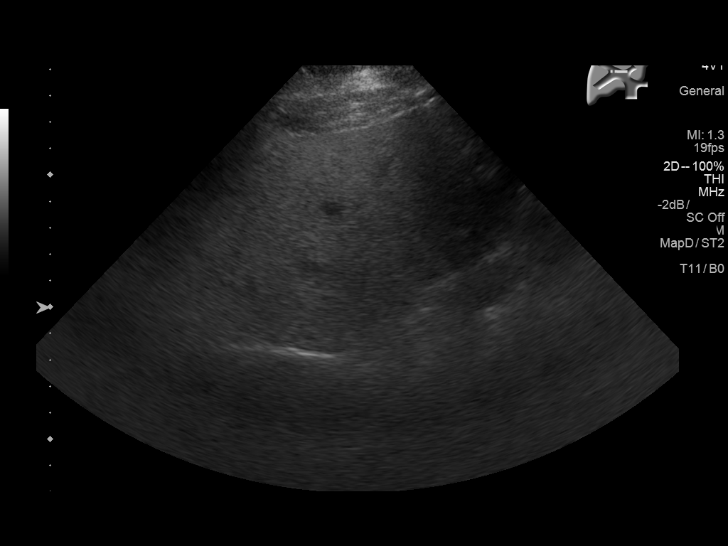
[im 51/51]
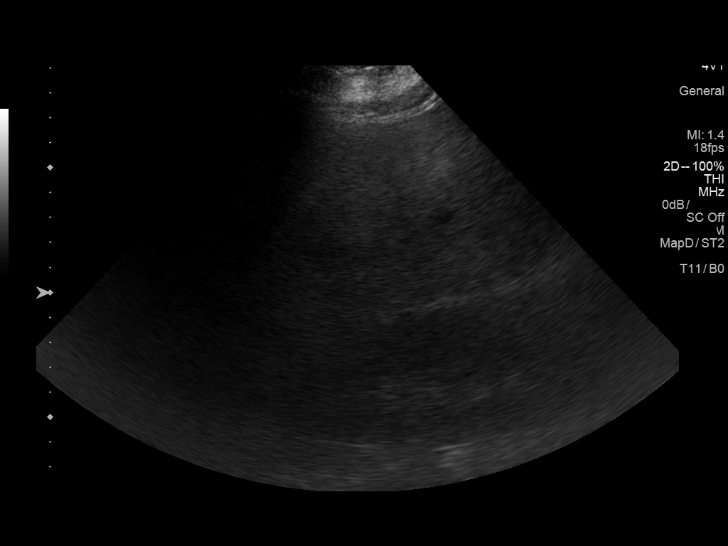

[14 of 25 positions shown; findings below may reference images not displayed]

FINDINGS: Gallbladder:

The gallbladder is mildly distended. No gallbladder wall thickening
or pericholecystic fluid. Negative sonographic Murphy sign.

Common bile duct:

Diameter: 4 mm

Liver:

Diffusely increased in echogenicity. Suggestion of a 2.3 x 1.6 x
cm hypoechoic nodule within the right hepatic lobe.
IMPRESSION: No cholelithiasis or sonographic evidence for acute cholecystitis.
Gallbladder is mildly distended, nonspecific.

Increased hepatic parenchymal echogenicity compatible with
steatosis.

Suggestion of a hypoechoic nodule (2.3 cm) within the right hepatic
lobe. It is unclear if this represents a true hepatic nodule or
fatty sparing. Patient had recent abdominal MRI 08/20/2016 which
demonstrated multiple small hepatic lesions. Recommend follow-up as
per prior MRI with additional hepatic MRI [DATE].

## 2018-01-30 ENCOUNTER — Other Ambulatory Visit: Payer: Self-pay

## 2018-01-30 ENCOUNTER — Emergency Department
Admission: EM | Admit: 2018-01-30 | Discharge: 2018-01-30 | Disposition: A | Discharge status: Home / Self Care | Payer: BLUE CROSS/BLUE SHIELD | Attending: Emergency Medicine | Admitting: Emergency Medicine

## 2018-01-30 ENCOUNTER — Emergency Department: Payer: BLUE CROSS/BLUE SHIELD

## 2018-01-30 DIAGNOSIS — Z794 Long term (current) use of insulin: Secondary | ICD-10-CM | POA: Insufficient documentation

## 2018-01-30 DIAGNOSIS — R1013 Epigastric pain: Secondary | ICD-10-CM | POA: Diagnosis not present

## 2018-01-30 DIAGNOSIS — I10 Essential (primary) hypertension: Secondary | ICD-10-CM | POA: Insufficient documentation

## 2018-01-30 DIAGNOSIS — Z79899 Other long term (current) drug therapy: Secondary | ICD-10-CM | POA: Diagnosis not present

## 2018-01-30 DIAGNOSIS — E1165 Type 2 diabetes mellitus with hyperglycemia: Secondary | ICD-10-CM | POA: Diagnosis not present

## 2018-01-30 DIAGNOSIS — R739 Hyperglycemia, unspecified: Secondary | ICD-10-CM

## 2018-01-30 DIAGNOSIS — F17210 Nicotine dependence, cigarettes, uncomplicated: Secondary | ICD-10-CM | POA: Insufficient documentation

## 2018-01-30 LAB — CBC WITH DIFFERENTIAL/PLATELET
BASOS ABS: 0.3 10*3/uL — AB (ref 0–0.1)
Basophils Relative: 2 %
Eosinophils Absolute: 0.6 10*3/uL (ref 0–0.7)
Eosinophils Relative: 5 %
HEMATOCRIT: 44.6 % (ref 40.0–52.0)
HEMOGLOBIN: 16.1 g/dL (ref 13.0–18.0)
LYMPHS PCT: 27 %
Lymphs Abs: 3 10*3/uL (ref 1.0–3.6)
MCH: 32.8 pg (ref 26.0–34.0)
MCHC: 36.1 g/dL — ABNORMAL HIGH (ref 32.0–36.0)
MCV: 90.8 fL (ref 80.0–100.0)
Monocytes Absolute: 0.6 10*3/uL (ref 0.2–1.0)
Monocytes Relative: 5 %
NEUTROS ABS: 6.8 10*3/uL — AB (ref 1.4–6.5)
NEUTROS PCT: 61 %
Platelets: 250 10*3/uL (ref 150–440)
RBC: 4.91 MIL/uL (ref 4.40–5.90)
RDW: 12.2 % (ref 11.5–14.5)
WBC: 11.2 10*3/uL — ABNORMAL HIGH (ref 3.8–10.6)

## 2018-01-30 LAB — TROPONIN I: Troponin I: <0.03 ng/mL (ref <0.03)

## 2018-01-30 LAB — COMPREHENSIVE METABOLIC PANEL
ALK PHOS: 106 U/L (ref 38–126)
ALT: 24 U/L (ref 0–44)
ANION GAP: 12 (ref 5–15)
AST: 20 U/L (ref 15–41)
Albumin: 4 g/dL (ref 3.5–5.0)
BUN: 7 mg/dL (ref 6–20)
CALCIUM: 9.1 mg/dL (ref 8.9–10.3)
CO2: 25 mmol/L (ref 22–32)
Chloride: 105 mmol/L (ref 98–111)
Creatinine, Ser: 0.69 mg/dL (ref 0.61–1.24)
GFR calc non Af Amer: >60 mL/min (ref >60)
Glucose, Bld: 387 mg/dL — ABNORMAL HIGH (ref 70–99)
Potassium: 3.2 mmol/L — ABNORMAL LOW (ref 3.5–5.1)
SODIUM: 142 mmol/L (ref 135–145)
TOTAL PROTEIN: 7.2 g/dL (ref 6.5–8.1)
Total Bilirubin: 0.6 mg/dL (ref 0.3–1.2)

## 2018-01-30 LAB — GLUCOSE, CAPILLARY: GLUCOSE-CAPILLARY: 372 mg/dL — AB (ref 70–99)

## 2018-01-30 LAB — ETHANOL: ALCOHOL ETHYL (B): 145 mg/dL — AB (ref <10)

## 2018-01-30 LAB — LIPASE, BLOOD: Lipase: 37 U/L (ref 11–51)

## 2018-01-30 MED ORDER — SODIUM CHLORIDE 0.9 % IV BOLUS
1000.0000 mL | Freq: Once | INTRAVENOUS | Status: AC
  Administered 2018-01-30: 1000 mL via INTRAVENOUS

## 2018-01-30 MED ORDER — GI COCKTAIL ~~LOC~~
30.0000 mL | Freq: Once | ORAL | Status: AC
  Administered 2018-01-30: 30 mL via ORAL
  Filled 2018-01-30: qty 30

## 2018-01-30 MED ORDER — MORPHINE SULFATE (PF) 4 MG/ML IV SOLN
4.0000 mg | Freq: Once | INTRAVENOUS | Status: AC
  Administered 2018-01-30: 4 mg via INTRAVENOUS

## 2018-01-30 MED ORDER — PANTOPRAZOLE SODIUM 40 MG PO TBEC: 40.0000 mg | DELAYED_RELEASE_TABLET | Freq: Every day | ORAL | 0 refills | Status: DC

## 2018-01-30 MED ORDER — ONDANSETRON HCL 4 MG/2ML IJ SOLN
INTRAMUSCULAR | Status: AC
  Administered 2018-01-30: 4 mg via INTRAVENOUS
  Filled 2018-01-30: qty 2

## 2018-01-30 MED ORDER — IOHEXOL 300 MG/ML  SOLN
100.0000 mL | Freq: Once | INTRAMUSCULAR | Status: AC | PRN
  Administered 2018-01-30: 100 mL via INTRAVENOUS

## 2018-01-30 MED ORDER — MORPHINE SULFATE (PF) 4 MG/ML IV SOLN
INTRAVENOUS | Status: AC
  Administered 2018-01-30: 4 mg via INTRAVENOUS
  Filled 2018-01-30: qty 1

## 2018-01-30 MED ORDER — HYDROMORPHONE HCL 1 MG/ML IJ SOLN
0.5000 mg | Freq: Once | INTRAMUSCULAR | Status: AC
  Administered 2018-01-30: 0.5 mg via INTRAVENOUS
  Filled 2018-01-30: qty 1

## 2018-01-30 MED ORDER — ONDANSETRON HCL 4 MG/2ML IJ SOLN
4.0000 mg | Freq: Once | INTRAMUSCULAR | Status: AC
  Administered 2018-01-30: 4 mg via INTRAVENOUS

## 2018-01-30 NOTE — ED Provider Notes (Addendum)
[facility] Emergency Department Provider Note    First MD Initiated Contact with Patient 01/30/18 [phone]     (approximate)  I have reviewed the triage vital signs and the nursing notes.   HISTORY  Chief Complaint Hyperglycemia; Abdominal Pain; and Chest Pain   HPI Donald Hart is a 47 y.o. male with below list of chronic medical conditions presents to the emergency department with acute onset of epigastric abdominal pain that patient states is currently 9 out of 10.  Patient states that "I had not drank in a while and I just messed up tonight".  Patient admits to EtOH ingestion tonight followed by abdominal pain.  Patient denies any fever.  No diarrhea or constipation.  Patient denies any chest pain or shortness of breath at this time.   Past Medical History:  Diagnosis Date  . Acute pancreatitis 07/2015   hospitalization at [facility]  . Alcohol abuse   . Chronic back pain   . Family history of diabetes mellitus    sister  . Family history of heart attack    Father.   . Family history of stomach cancer   . Gout   . Hypertension   . Hypertriglyceridemia   . Type 2 diabetes mellitus (HCC) 07/2015    Patient Active Problem List   Diagnosis Date Noted  . Hypertriglyceridemia 09/25/2016  . Atypical chest pain 09/25/2016  . Liver lesion 09/25/2016  . Polysubstance abuse (HCC) 09/25/2016  . Acute on chronic pancreatitis (HCC) 09/20/2016  . Alcohol abuse   . Acute pancreatitis 07/03/2015  . Insulin dependent diabetes mellitus (HCC) 06/01/2015  . ARF (acute renal failure) (HCC) 01/19/2015  . Leukocytosis 01/19/2015    Past Surgical History:  Procedure Laterality Date  . NO PAST SURGERIES      Prior to Admission medications   Medication Sig Start Date End Date Taking? Authorizing Provider  albuterol (PROVENTIL HFA;VENTOLIN HFA) 108 (90 Base) MCG/ACT inhaler Inhale 1-2 puffs every 6 (six) hours as needed into the lungs for wheezing or  shortness of breath. 05/05/17   Payton Mccallumonty, [place], MD  allopurinol (ZYLOPRIM) 100 MG tablet Take 2 tablets by mouth daily. 05/17/15   [provider]  amitriptyline (ELAVIL) 75 MG tablet Take 1 tablet (75 mg total) by mouth at bedtime. 09/30/16   Gouru, Deanna ArtisAruna, MD  amLODipine (NORVASC) 10 MG tablet Take 1 tablet by mouth daily. 05/17/15   [provider]  atorvastatin (LIPITOR) 40 MG tablet Take 40 mg by mouth daily. Reported on 07/21/2015 07/12/15 09/20/16  [provider]  azithromycin (ZITHROMAX Z-PAK) 250 MG tablet 2 tabs po once day 1, then 1 tab po qd for next 4 days 05/05/17   Payton Mccallumonty, Orlando, MD  benzonatate (TESSALON) 200 MG capsule Take 1 capsule (200 mg total) 3 (three) times daily as needed by mouth. 05/05/17   Payton Mccallumonty, [place], MD  carvedilol (COREG) 6.25 MG tablet Take 1 tablet by mouth 2 (two) times daily. 05/22/15   [provider]  colchicine 0.6 MG tablet Take 0.6 mg by mouth daily as needed.    [provider]  docusate sodium (COLACE) 100 MG capsule Take 1 capsule (100 mg total) by mouth 2 (two) times daily as needed for mild constipation. 09/30/16   Ramonita LabGouru, Aruna, MD  fenofibrate 160 MG tablet Take 1 tablet (160 mg total) by mouth daily. 10/01/16   Ramonita LabGouru, Aruna, MD  folic acid (FOLVITE) 1 MG tablet Take 1 mg by mouth daily.    [provider]  gabapentin (NEURONTIN) 300 MG capsule Take 300 mg by mouth 3 (three) times daily.    [provider]  guaiFENesin-dextromethorphan (ROBITUSSIN DM) 100-10 MG/5ML syrup Take 10 mLs by mouth every 6 (six) hours as needed for cough. Patient not taking: Reported on 09/04/2016 07/15/16   Ramonita Lab, MD  hyoscyamine (LEVSIN, ANASPAZ) 0.125 MG tablet Take 1 tablet (0.125 mg total) by mouth every 6 (six) hours as needed for cramping. 09/30/16   Gouru, Deanna Artis, MD  insulin NPH Human (HUMULIN N,NOVOLIN N) 100 UNIT/ML injection Inject 0.1 mLs (10 Units total) into the skin at bedtime. Reported on 07/21/2015 09/30/16  12/05/17  Ramonita Lab, MD  insulin regular (NOVOLIN R,HUMULIN R) 250 units/2.44mL (100 units/mL) injection Inject 0.07 mLs (7 Units total) into the skin 3 (three) times daily before meals. 09/30/16   Ramonita Lab, MD  losartan (COZAAR) 100 MG tablet Take 100 mg by mouth daily.    [provider]  magnesium oxide (MAG-OX) 400 MG tablet Take 400 mg by mouth daily.    [provider]  Melatonin 3 MG TABS Take 6 mg by mouth at bedtime.    [provider]  menthol-cetylpyridinium (CEPACOL) 3 MG lozenge Take 1 lozenge (3 mg total) by mouth as needed for sore throat. Patient not taking: Reported on 09/20/2016 07/15/16   Ramonita Lab, MD  Multiple Vitamin (MULTIVITAMIN WITH MINERALS) TABS tablet Take 1 tablet by mouth daily. 10/01/16   Ramonita Lab, MD  niacin 100 MG tablet Take 1 tablet (100 mg total) by mouth at bedtime. 09/30/16   Gouru, Deanna Artis, MD  nicotine (NICODERM CQ - DOSED IN MG/24 HOURS) 21 mg/24hr patch Place 1 patch (21 mg total) onto the skin daily. 10/01/16   Gouru, Deanna Artis, MD  omega-3 acid ethyl esters (LOVAZA) 1 g capsule Take 2 g by mouth 2 (two) times daily.    [provider]  oxyCODONE (OXY IR/ROXICODONE) 5 MG immediate release tablet Take 1 tablet (5 mg total) by mouth every 4 (four) hours as needed for moderate pain or breakthrough pain (mod pain). 09/30/16   Gouru, Deanna Artis, MD  oxyCODONE-acetaminophen (ROXICET) 5-325 MG tablet Take 1 tablet by mouth every 6 (six) hours as needed. 04/04/17   Minna Antis, MD  pantoprazole (PROTONIX) 40 MG tablet Take 1 tablet (40 mg total) by mouth daily. 04/04/17 04/04/18  Minna Antis, MD  thiamine 100 MG tablet Take 1 tablet (100 mg total) by mouth daily. 10/01/16   Ramonita Lab, MD    Allergies No known drug allergies  Family History  Adopted: Yes  Problem Relation Age of Onset  . Heart attack Father   . Stomach cancer Father   . Diabetes Father   . Diabetes Sister   . Diabetes Sister     Social History Social  History   Tobacco Use  . Smoking status: Current Every Day Smoker    Packs/day: 1.00    Years: 20.00    Pack years: 20.00    Types: Cigarettes  . Smokeless tobacco: Never Used  Substance Use Topics  . Alcohol use: Yes    Alcohol/week: 7.2 oz    Types: 12 Cans of beer per week    Comment: none since January hospitalization  . Drug use: No    Review of Systems Constitutional: No fever/chills Eyes: No visual changes. ENT: No sore throat. Cardiovascular: Denies chest pain. Respiratory: Denies shortness of breath. Gastrointestinal: Positive for abdominal pain and nausea no vomiting.  No diarrhea.  No constipation.  Genitourinary: Negative for dysuria. Musculoskeletal: Negative for neck pain.  Negative for back pain. Integumentary: Negative for rash. Neurological: Negative for headaches, focal weakness or numbness.  ____________________________________________   PHYSICAL EXAM:  VITAL SIGNS: ED Triage Vitals  Enc Vitals Group     BP 01/30/18 0108 (!) 155/98     Pulse Rate 01/30/18 0108 93     Resp 01/30/18 0108 (!) 22     Temp 01/30/18 0108 98.4 F (36.9 C)     Temp Source 01/30/18 0108 Oral     SpO2 01/30/18 0108 96 %     Weight 01/30/18 0053 108.9 kg (240 lb)     Height 01/30/18 0053 1.88 m (6\' 2" )     Head Circumference --      Peak Flow --      Pain Score 01/30/18 0053 8     Pain Loc --      Pain Edu? --      Excl. in GC? --     Constitutional: Alert and oriented.  Current discomfort.   Eyes: Conjunctivae are normal. PERRL. EOMI. Head: Atraumatic. Mouth/Throat: Mucous membranes are moist.  Oropharynx non-erythematous. Neck: No stridor.   Cardiovascular: Normal rate, regular rhythm. Good peripheral circulation. Grossly normal heart sounds. Respiratory: Normal respiratory effort.  No retractions. Lungs CTAB. Gastrointestinal: Epigastric tenderness to palpation.. No distention.  Musculoskeletal: No lower extremity tenderness nor edema. No gross deformities of  extremities. Neurologic:  Normal speech and language. No gross focal neurologic deficits are appreciated.  Skin:  Skin is warm, dry and intact. No rash noted. Psychiatric: Mood and affect are normal. Speech and behavior are normal.  ____________________________________________   LABS (all labs ordered are listed, but only abnormal results are displayed)  Labs Reviewed  CBC WITH DIFFERENTIAL/PLATELET - Abnormal; Notable for the following components:      Result Value   WBC 11.2 (*)    MCHC 36.1 (*)    Neutro Abs 6.8 (*)    Basophils Absolute 0.3 (*)    All other components within normal limits  COMPREHENSIVE METABOLIC PANEL - Abnormal; Notable for the following components:   Potassium 3.2 (*)    Glucose, Bld 387 (*)    All other components within normal limits  ETHANOL - Abnormal; Notable for the following components:   Alcohol, Ethyl (B) 145 (*)    All other components within normal limits  GLUCOSE, CAPILLARY - Abnormal; Notable for the following components:   Glucose-Capillary 372 (*)    All other components within normal limits  LIPASE, BLOOD  TROPONIN I   ____________________________________________  EKG  ED ECG REPORT I, [place] N BROWN, the attending physician, personally viewed and interpreted this ECG.   Date: 01/30/2018  EKG Time: 12:53 AM  Rate: 92  Rhythm: Normal sinus rhythm  Axis: Normal  Intervals:Normal  ST&T Change: None  ____________________________________________  RADIOLOGY I, [place] N BROWN, personally viewed and evaluated these images (plain radiographs) as part of my medical decision making, as well as reviewing the written report by the radiologist.  ED MD interpretation: No acute intra-abdominal pelvic pathology per radiologist.  Official radiology report(s): Ct Abdomen Pelvis W Contrast  Result Date: 01/30/2018 CLINICAL DATA:  Abdominal chest pain, shortness of breath. History of pancreatitis, alcohol abuse, diabetes. EXAM: CT ABDOMEN  AND PELVIS WITH CONTRAST TECHNIQUE: Multidetector CT imaging of the abdomen and pelvis was performed using the standard protocol following bolus administration of intravenous contrast. CONTRAST:  OMNIPAQUE IOHEXOL 300 MG/ML  SOLN COMPARISON:  CT  abdomen and pelvis December 04, 2016 FINDINGS: LOWER CHEST: Lung bases are clear. Included heart size is upper limits of normal. No pericardial effusion. HEPATOBILIARY: Mild hepatomegaly, decreased from prior examination. PANCREAS: Nonacute. Subcentimeter cystic mass in pancreatic neck is less conspicuous than prior CT. SPLEEN: Normal. ADRENALS/URINARY TRACT: Kidneys are orthotopic, demonstrating symmetric enhancement. Multifocal LEFT renal scarring. No nephrolithiasis, hydronephrosis or solid renal masses. 2.4 cm homogeneously hypodense benign-appearing cyst RIGHT interpolar kidney. The unopacified ureters are normal in course and caliber. Delayed imaging through the kidneys demonstrates symmetric prompt contrast excretion within the proximal urinary collecting system. Urinary bladder is well distended. Normal adrenal glands. STOMACH/BOWEL: The stomach, small and large bowel are normal in course and caliber without inflammatory changes. Fatty infiltration of gastric antrum seen with chronic inflammation, no acute component. Small amount of small bowel feces compatible with chronic stasis. VASCULAR/LYMPHATIC: Aortoiliac vessels are normal in course and caliber. Moderate calcific atherosclerosis. No lymphadenopathy by CT size criteria. REPRODUCTIVE: Normal. OTHER: No intraperitoneal free fluid or free air. Phleboliths RIGHT pelvis, inferior to the bladder. MUSCULOSKELETAL: Nonacute. RIGHT lateral abdominal wall sheet like calcifications. Moderate fat containing inguinal hernias. Mild degenerative changes spine. IMPRESSION: 1. No acute intra-abdominal or pelvic process. 2. Mild improved hepatomegaly. Aortic Atherosclerosis (ICD10-I70.0). Electronically Signed   By: Awilda Metro M.D.   On: 01/30/2018 03:17      Procedures   ____________________________________________   INITIAL IMPRESSION / ASSESSMENT AND PLAN / ED COURSE  As part of my medical decision making, I reviewed the following data within the electronic MEDICAL RECORD NUMBER   47 year old male presenting with above-stated history and physical exam secondary to epigastric abdominal pain.  EMS also reported that the patient stated chest pain however no chest pain on arrival to the ED. given patient's apparent discomfort on arrival patient was given IV morphine 4 mg with minimal improvement and as such patient was given IV Dilaudid 1 mg.  Concern for possible pancreatitis versus gastritis patient's lipase normal CT scan of the abdomen pelvis revealed no acute intra-abdominal pelvic pathology.  Regarding patient's chest pain troponin x2 was obtained following an EKG which was normal.    ____________________________________________  FINAL CLINICAL IMPRESSION(S) / ED DIAGNOSES  Final diagnoses:  Hyperglycemia  Epigastric pain     MEDICATIONS GIVEN DURING THIS VISIT:  Medications  sodium chloride 0.9 % bolus 1,000 mL (0 mLs Intravenous Stopped 01/30/18 0223)  sodium chloride 0.9 % bolus 1,000 mL (0 mLs Intravenous Stopped 01/30/18 0223)  ondansetron (ZOFRAN) injection 4 mg (4 mg Intravenous Given 01/30/18 0115)  morphine 4 MG/ML injection 4 mg (4 mg Intravenous Given 01/30/18 0115)  gi cocktail (Maalox,Lidocaine,Donnatal) (30 mLs Oral Given 01/30/18 0227)  iohexol (OMNIPAQUE) 300 MG/ML solution 100 mL (100 mLs Intravenous Contrast Given 01/30/18 0237)  HYDROmorphone (DILAUDID) injection 0.5 mg (0.5 mg Intravenous Given 01/30/18 1610)     ED Discharge Orders    None       Note:  This document was prepared using Dragon voice recognition software and may include unintentional dictation errors.    Darci Current, MD 01/30/18 0530    Darci Current, MD 01/30/18 [phone]

## 2018-01-30 NOTE — ED Triage Notes (Signed)
Pt presents to the ED via [facility] from home with c/o abdominal pain, chest pain, and SHOB. EMS states pt has been having some chest pain and abdominal pain for 3 hours. Pt had BS of 447 en route and 372 upon arrival.

## 2018-04-17 IMAGING — CT CT ABD-PELV W/ CM
2 of 5 series · 15 of 46 positions shown, 17 images · IV contrast (iopamidol)
Comparison: Ultrasound same date. CT 07/12/2016 and MRI 08/20/2016.

CLINICAL DATA: Central stabbing chest pain for 3 hours. Low
abdominal pain. [name] and dizziness. History of
diabetes, renal failure and pancreatitis.

EXAM:
CT ABDOMEN AND PELVIS WITH CONTRAST
TECHNIQUE: Multidetector CT imaging of the abdomen and pelvis was performed
using the standard protocol following bolus administration of
intravenous contrast.
CONTRAST:  100mL VVWGM4-HMM IOPAMIDOL (VVWGM4-HMM) INJECTION 61%

[Series 2: routine abd/pel with · axial · 0.96mm/px · z∈[-1056,-546]mm · 12 of 115 slices shown, 14 images]
[im 7/115  soft-tissue]
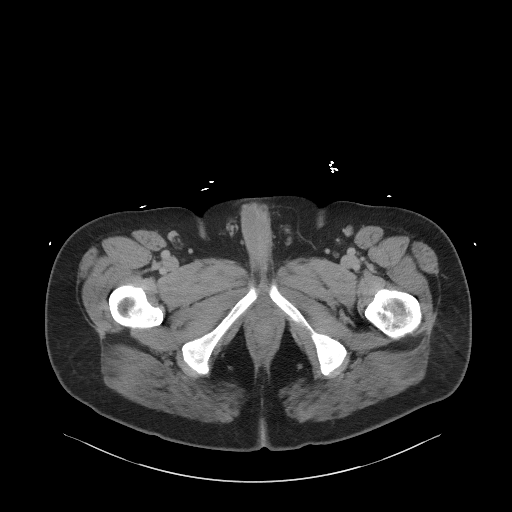
[im 7/115  bone]
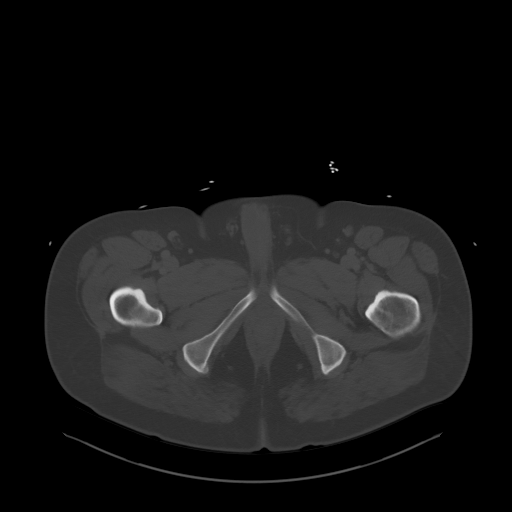
[im 19/115  soft-tissue]
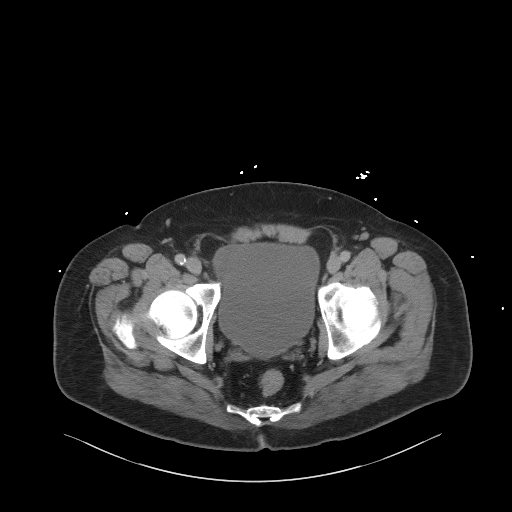
[im 25/115  soft-tissue]
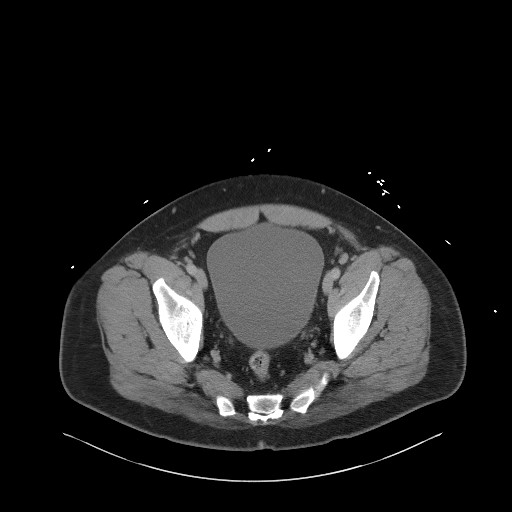
[im 37/115  soft-tissue]
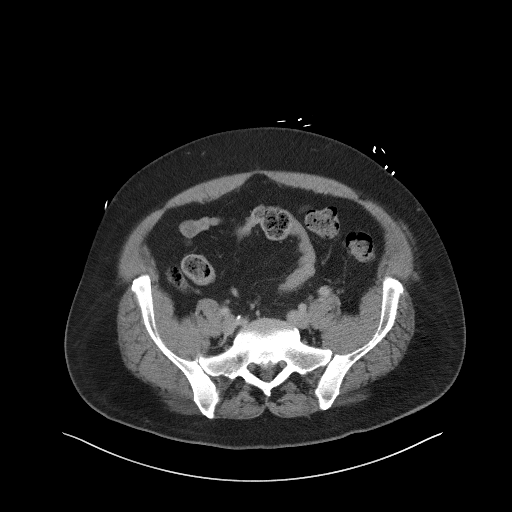
[im 43/115  soft-tissue]
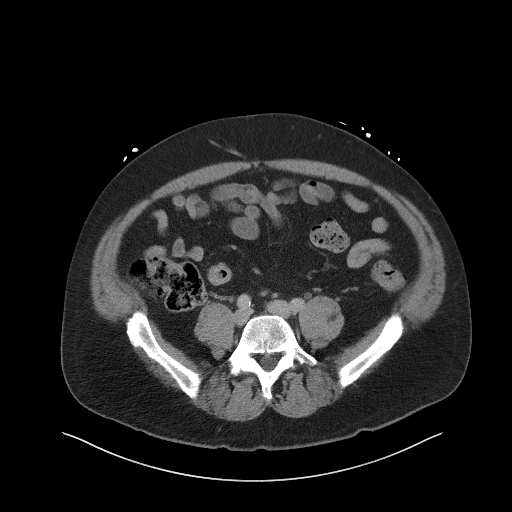
[im 55/115  soft-tissue]
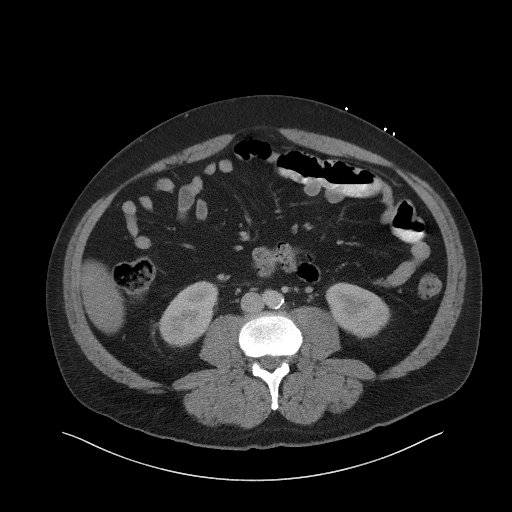
[im 61/115  soft-tissue]
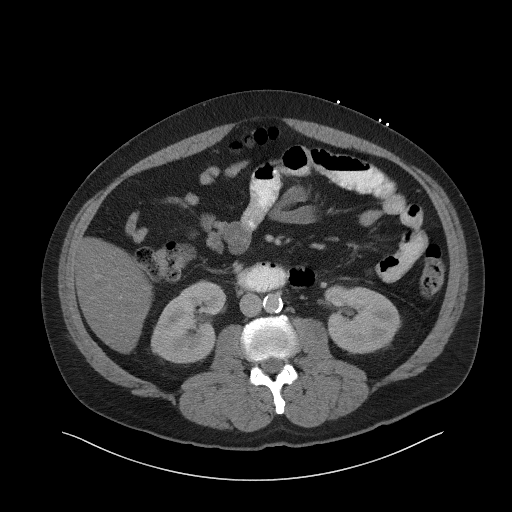
[im 73/115  soft-tissue]
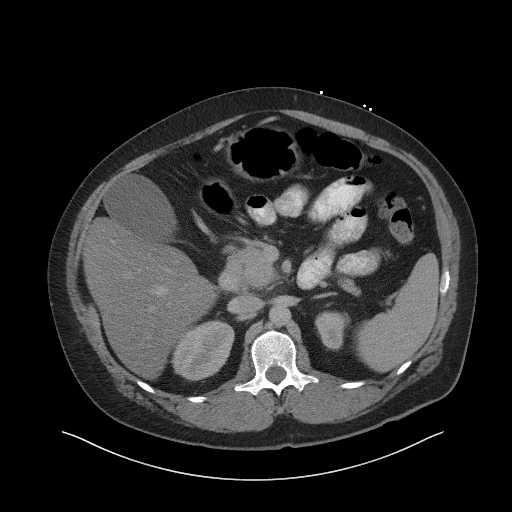
[im 79/115  soft-tissue]
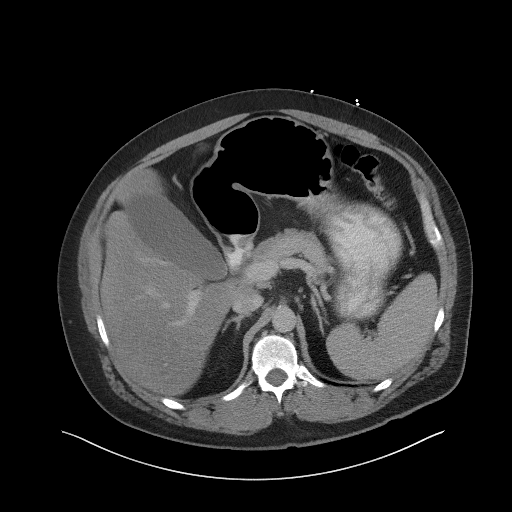
[im 79/115  bone]
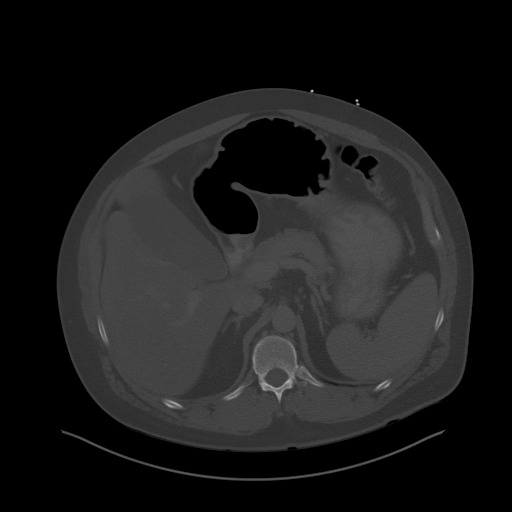
[im 91/115  soft-tissue]
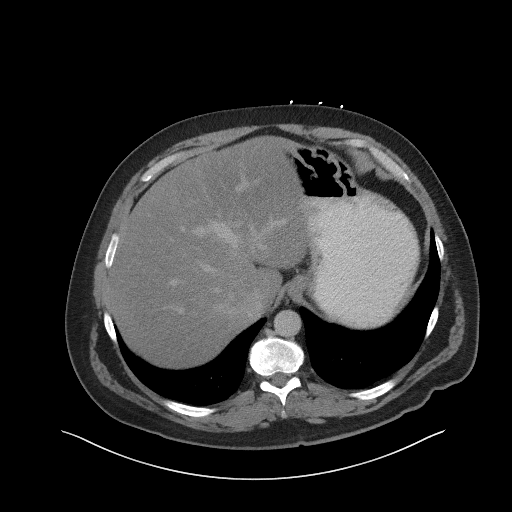
[im 97/115  soft-tissue]
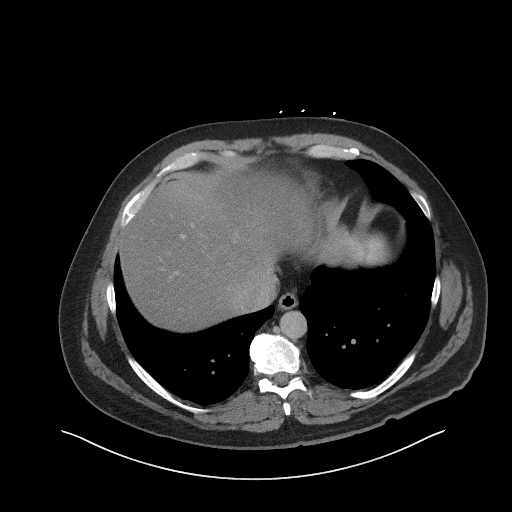
[im 109/115  soft-tissue]
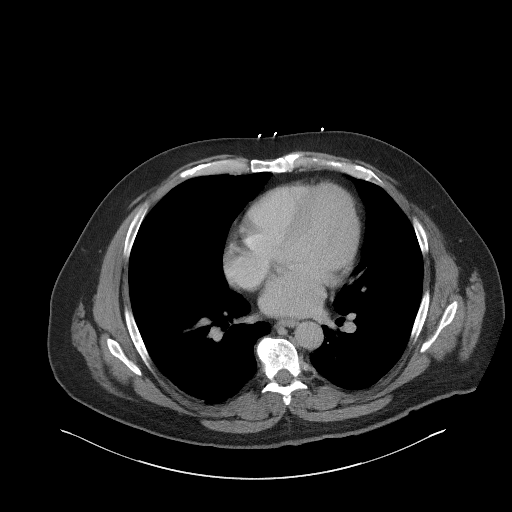

[Series 5: coronal st · coronal · 0.83mm/px · 3 of 111 slices shown]
[im 37/111  soft-tissue]
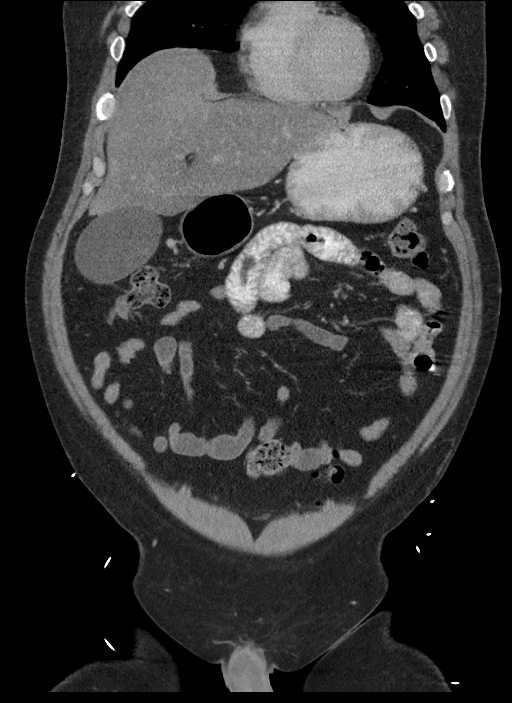
[im 49/111  soft-tissue]
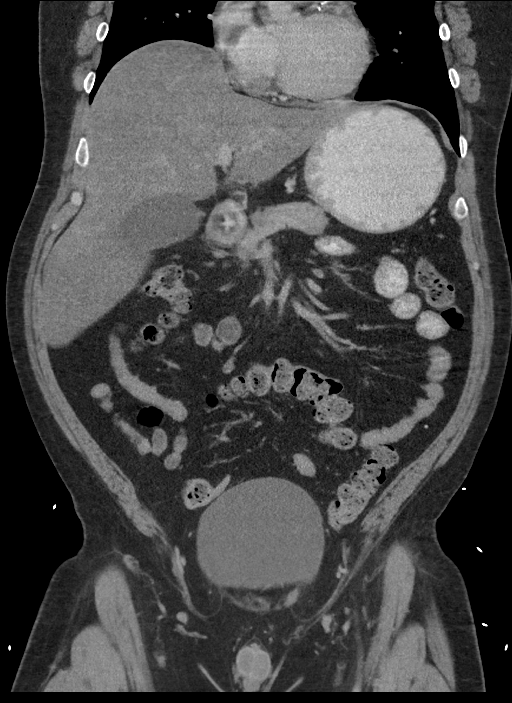
[im 62/111  soft-tissue]
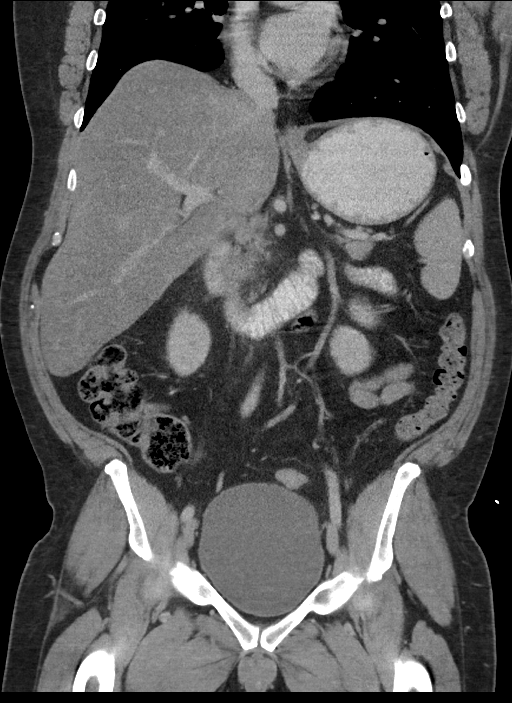

[15 of 46 positions shown; findings below may reference images not displayed]

FINDINGS: Lower chest: There is focal right perihilar upper lobe ground-glass
density on image number 1, measuring 19 mm. This is incompletely
visualized and was not imaged on the prior study. The lung bases are
otherwise clear. There is no pleural or pericardial effusion.
Coronary artery atherosclerosis noted.

Hepatobiliary: Diffuse hepatic steatosis. There is relative sparing
around the gallbladder. Scattered small foci of rounded increased
density in the liver similar to previous MRI. No new or enlarging
liver lesions are seen. No evidence of gallstones, gallbladder wall
thickening or biliary dilatation.

Pancreas: 11 mm low-density lesion in the pancreatic head on image
40 is similar to recent MRI per there is soft tissue stranding
within the retroperitoneal fat surrounding the pancreatic head.
There is no focal fluid collection or ductal dilatation.

Spleen: Normal in size without focal abnormality.

Adrenals/Urinary Tract: Both adrenal glands appear normal. Stable
oblong cyst in the interpolar region of the right kidney. No
evidence of enhancing renal mass, urinary tract calculus or
hydronephrosis. The bladder appears normal.

Stomach/Bowel: No evidence of bowel wall thickening, distention or
surrounding inflammatory change. The appendix appears normal.

Vascular/Lymphatic: Stable probable reactive nodes in the porta
hepatis. No retroperitoneal lymphadenopathy. Aortic and branch
vessel atherosclerosis. No acute vascular findings.

Reproductive: The prostate gland and seminal vesicles appear
unremarkable.

Other: Stable prominent fat in both inguinal canals. Of the anterior
abdominal wall otherwise appears normal. No generalized ascites.

Musculoskeletal: No acute or significant osseous findings.
IMPRESSION: 1. No definite acute findings or explanation for low abdominal pain.
2. Soft tissue stranding around and low-density within the
pancreatic head appear similar to recent prior studies and is
probably the sequela of previous pancreatitis in this patient
without current serum lipase elevation.
3. Hepatic steatosis with grossly stable hyperdense hepatic lesions.
These were evaluated by MRI 1 month ago. Please refer to follow up
recommendations.
4. Focal ground-glass pulmonary density in the right upper lobe, not
previously imaged. This is likely inflammatory.
5. Aortic and coronary artery atherosclerosis.

## 2018-04-17 IMAGING — CR DG CHEST 2V
2 series · 2 of 2 positions shown · non-contrast
Comparison: 07/02/2016

CLINICAL DATA: Smoker, chest pain, history of pancreatitis

EXAM:
CHEST  2 VIEW

[chest pa]
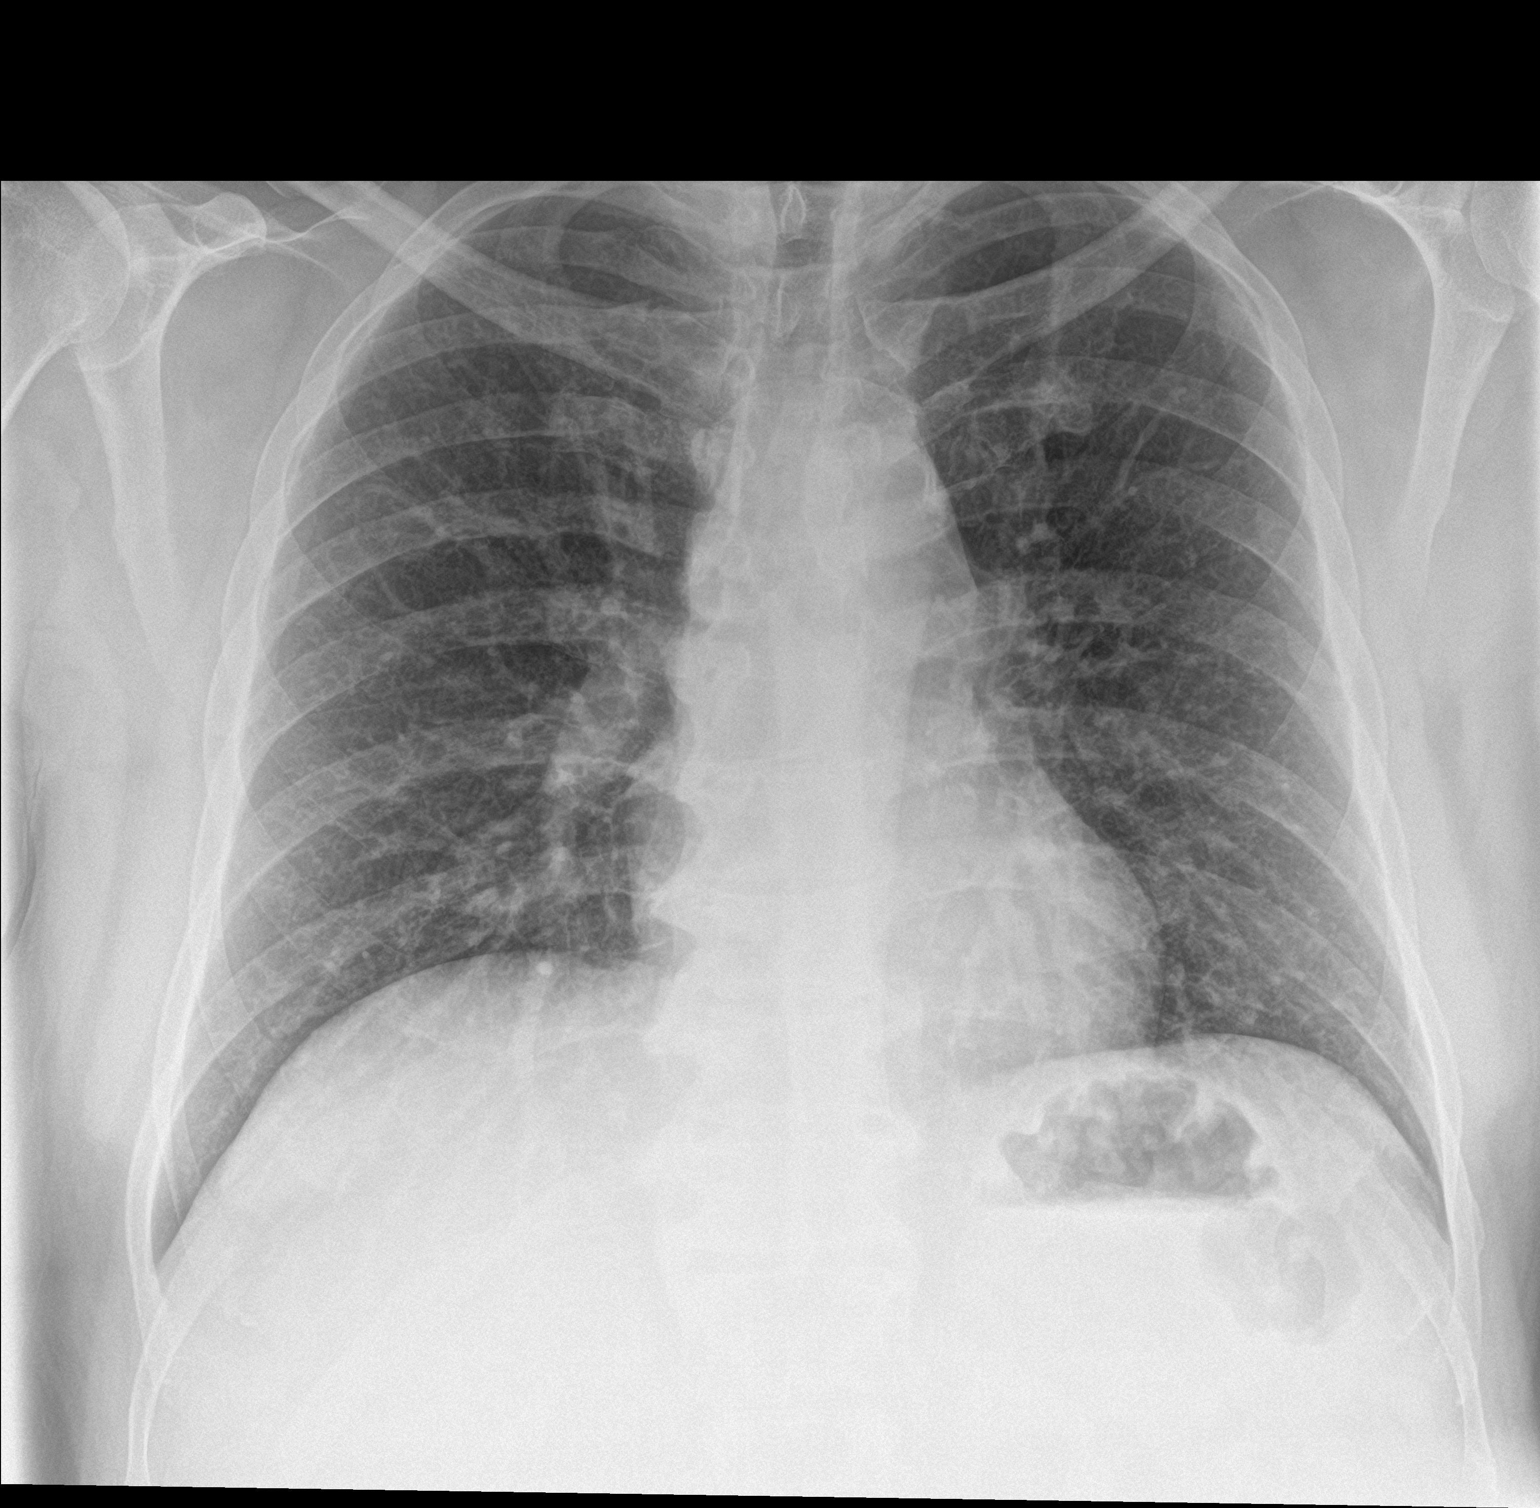

[chest lat]
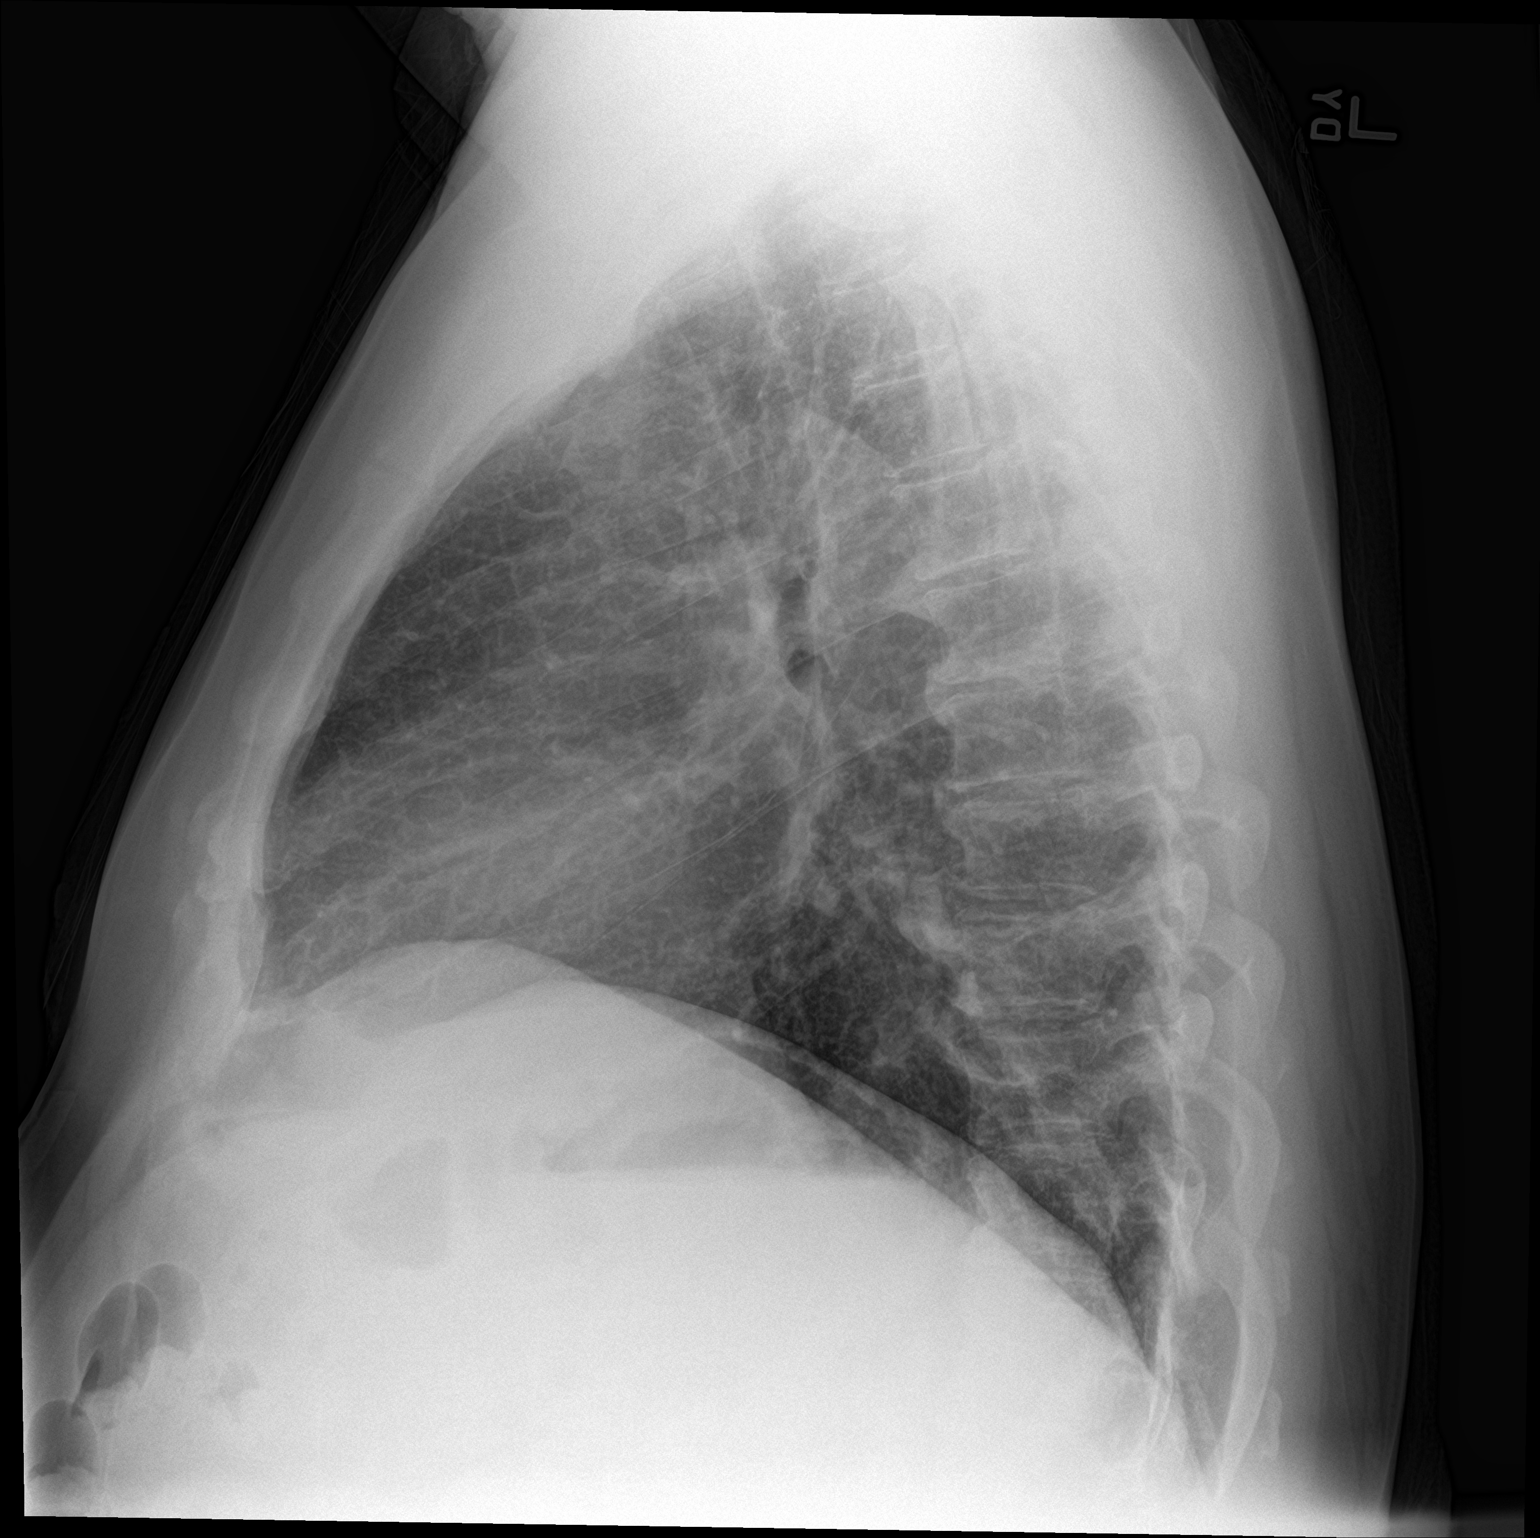

[2 of 2 positions shown; findings below may reference images not displayed]

FINDINGS: Cardiomediastinal silhouette is stable. No segmental infiltrate or
focal consolidation. Subtle reticular mild interstitial prominence
bilaterally. Mild interstitial edema or pneumonitis cannot be
excluded. Clinical correlation is necessary.
IMPRESSION: No segmental infiltrate or focal consolidation. Subtle reticular
mild interstitial prominence bilaterally. Mild interstitial edema or
pneumonitis cannot be excluded. Clinical correlation is necessary.

## 2019-03-02 ENCOUNTER — Other Ambulatory Visit: Payer: Self-pay

## 2019-03-02 DIAGNOSIS — Z20822 Contact with and (suspected) exposure to covid-19: Secondary | ICD-10-CM

## 2019-03-03 LAB — NOVEL CORONAVIRUS, NAA: SARS-CoV-2, NAA: NOT DETECTED

## 2021-09-11 ENCOUNTER — Encounter: Payer: Self-pay | Admitting: *Deleted

## 2021-09-11 ENCOUNTER — Emergency Department: Payer: Self-pay

## 2021-09-11 ENCOUNTER — Other Ambulatory Visit: Payer: Self-pay

## 2021-09-11 ENCOUNTER — Emergency Department
Admission: EM | Admit: 2021-09-11 | Discharge: 2021-09-11 | Disposition: A | Discharge status: Home / Self Care | Payer: Self-pay | Attending: Emergency Medicine | Admitting: Emergency Medicine

## 2021-09-11 DIAGNOSIS — S52135A Nondisplaced fracture of neck of left radius, initial encounter for closed fracture: Secondary | ICD-10-CM | POA: Insufficient documentation

## 2021-09-11 DIAGNOSIS — E119 Type 2 diabetes mellitus without complications: Secondary | ICD-10-CM | POA: Insufficient documentation

## 2021-09-11 DIAGNOSIS — I10 Essential (primary) hypertension: Secondary | ICD-10-CM | POA: Insufficient documentation

## 2021-09-11 DIAGNOSIS — W06XXXA Fall from bed, initial encounter: Secondary | ICD-10-CM | POA: Insufficient documentation

## 2021-09-11 DIAGNOSIS — S52125A Nondisplaced fracture of head of left radius, initial encounter for closed fracture: Secondary | ICD-10-CM

## 2021-09-11 MED ORDER — HYDROCODONE-ACETAMINOPHEN 5-325 MG PO TABS: 1.0000 | ORAL_TABLET | Freq: Four times a day (QID) | ORAL | 0 refills | Status: DC | PRN

## 2021-09-11 MED ORDER — HYDROCODONE-ACETAMINOPHEN 5-325 MG PO TABS: 1.0000 | ORAL_TABLET | Freq: Four times a day (QID) | ORAL | 0 refills | Status: AC | PRN

## 2021-09-11 MED ORDER — MELOXICAM 15 MG PO TABS: 15.0000 mg | ORAL_TABLET | Freq: Every day | ORAL | 0 refills | Status: DC

## 2021-09-11 NOTE — Discharge Instructions (Addendum)
Follow-up with orthopedics. ? ?Wear the sling for the next week or so.  Rest her arm is much as possible.  Apply ice over the sore area and take medication as prescribed.  Be advised, do not take the Norco if you are working or driving. ? ?Return to the emergency department for symptoms of change or worsen if you are unable to schedule an appointment. ?

## 2021-09-11 NOTE — ED Triage Notes (Signed)
Pt fell out of a bunk bed approx 2 weeks ago.   Pt has left arm pain.  Pt did not see a doctor when he fell.  Pt continues to have pain.  Pt alert.  ?

## 2021-09-11 NOTE — ED Provider Notes (Signed)
? ?[facility] ?Provider Note ? ? ? Event Date/Time  ? First MD Initiated Contact with Patient 09/11/21 1614   ?  (approximate) ? ? ?History  ? ?Arm Injury ? ? ?HPI ? ?Donald Hart is a 51 y.o. male with a history of type 2 diabetes, gout, hypertension, hyperlipidemia, alcohol abuse and as listed in EMR presents to the emergency department for treatment and evaluation of left arm pain after falling out of bed about 2 weeks ago.  Has had pain in the arm since.  He has not been evaluated since the injury. ? ?  ? ? ?Physical Exam  ? ?Triage Vital Signs: ?ED Triage Vitals  ?Enc Vitals Group  ?   BP 09/11/21 1606 (!) 148/92  ?   Pulse Rate 09/11/21 1606 (!) 102  ?   Resp 09/11/21 1606 17  ?   Temp 09/11/21 1606 97.6 ?F (36.4 ?C)  ?   Temp src --   ?   SpO2 09/11/21 1606 99 %  ?   Weight 09/11/21 1608 200 lb (90.7 kg)  ?   Height 09/11/21 1608 6\' 2"  (1.88 m)  ?   Head Circumference --   ?   Peak Flow --   ?   Pain Score 09/11/21 1608 7  ?   Pain Loc --   ?   Pain Edu? --   ?   Excl. in GC? --   ? ? ?Most recent vital signs: ?Vitals:  ? 09/11/21 1606  ?BP: (!) 148/92  ?Pulse: (!) 102  ?Resp: 17  ?Temp: 97.6 ?F (36.4 ?C)  ?SpO2: 99%  ? ? ?General: Awake, no distress.  ?CV:  Good peripheral perfusion.  ?Resp:  Normal effort.  ?Abd:  No distention.  ?Other:  Focal tenderness over the proximal radial head and elbow.  Unable to fully extend at the elbow. ? ? ?ED Results / Procedures / Treatments  ? ?Labs ?(all labs ordered are listed, but only abnormal results are displayed) ?Labs Reviewed - No data to display ? ? ?EKG ? ? ? ? ?RADIOLOGY ? ?Image and radiology report reviewed by me. ? ?Image of the left elbow shows transverse fracture of the neck of the radial head ? ?PROCEDURES: ? ?Critical Care performed: No ? ?Procedures ? ? ?MEDICATIONS ORDERED IN ED: ?Medications - No data to display ? ? ?IMPRESSION / MDM / ASSESSMENT AND PLAN / ED COURSE  ? ?I have reviewed the triage note. ? ?Differential  diagnosis includes, but is not limited to, radial head fracture, supracondylar fracture, elbow strain, humerus fracture ? ?51 year old male presenting to the emergency department for treatment and evaluation after fall 2 weeks ago.  See HPI for further details. ? ?Image of the left elbow reviewed by me.  Transverse fracture of the neck of the radial head without displacement.  Imaging reviewed with the patient also.  He will be placed in a sling and advised to follow-up with orthopedics.  Meloxicam and Norco prescribed.  He was advised to take medications as prescribed and not to take the Norco if he is driving or working.  He is to rest, ice, and elevate the arm as often as possible throughout the day.  Work note provided for today and light duty excuse for the next week. ? ?  ? ? ?FINAL CLINICAL IMPRESSION(S) / ED DIAGNOSES  ? ?Final diagnoses:  ?Closed nondisplaced fracture of head of left radius, initial encounter  ? ? ? ?Rx / DC Orders  ? ?  ED Discharge Orders   ? ?      Ordered  ?  meloxicam (MOBIC) 15 MG tablet  Daily       ? 09/11/21 1700  ?  HYDROcodone-acetaminophen (NORCO/VICODIN) 5-325 MG tablet  Every 6 hours PRN       ? 09/11/21 1700  ? ?  ?  ? ?  ? ? ? ?Note:  This document was prepared using Dragon voice recognition software and may include unintentional dictation errors. ?  ?Chinita Pester, FNP ?09/11/21 1707 ? ?  ?Georga Hacking, MD ?09/12/21 0010 ? ?

## 2022-07-23 ENCOUNTER — Encounter: Payer: Self-pay | Admitting: Internal Medicine

## 2022-07-23 ENCOUNTER — Encounter
Admission: EM | Disposition: A | Discharge status: Home / Self Care | Payer: Self-pay | Source: Home / Self Care | Attending: Emergency Medicine

## 2022-07-23 ENCOUNTER — Observation Stay (HOSPITAL_BASED_OUTPATIENT_CLINIC_OR_DEPARTMENT_OTHER)
Admit: 2022-07-23 | Discharge: 2022-07-23 | Disposition: A | Discharge status: Home / Self Care | Payer: Medicaid Other | Attending: Cardiovascular Disease | Admitting: Cardiovascular Disease

## 2022-07-23 ENCOUNTER — Emergency Department: Payer: Medicaid Other

## 2022-07-23 ENCOUNTER — Other Ambulatory Visit: Payer: Self-pay

## 2022-07-23 ENCOUNTER — Other Ambulatory Visit (HOSPITAL_COMMUNITY): Payer: Self-pay

## 2022-07-23 ENCOUNTER — Observation Stay
Admission: EM | Admit: 2022-07-23 | Discharge: 2022-07-24 | Disposition: A | Discharge status: Home / Self Care | Payer: Medicaid Other | Attending: Internal Medicine | Admitting: Internal Medicine

## 2022-07-23 DIAGNOSIS — R748 Abnormal levels of other serum enzymes: Secondary | ICD-10-CM

## 2022-07-23 DIAGNOSIS — F17200 Nicotine dependence, unspecified, uncomplicated: Secondary | ICD-10-CM | POA: Insufficient documentation

## 2022-07-23 DIAGNOSIS — E876 Hypokalemia: Secondary | ICD-10-CM | POA: Diagnosis not present

## 2022-07-23 DIAGNOSIS — K859 Acute pancreatitis without necrosis or infection, unspecified: Secondary | ICD-10-CM | POA: Diagnosis not present

## 2022-07-23 DIAGNOSIS — Z794 Long term (current) use of insulin: Secondary | ICD-10-CM | POA: Insufficient documentation

## 2022-07-23 DIAGNOSIS — R079 Chest pain, unspecified: Principal | ICD-10-CM | POA: Diagnosis present

## 2022-07-23 DIAGNOSIS — F17210 Nicotine dependence, cigarettes, uncomplicated: Secondary | ICD-10-CM | POA: Diagnosis not present

## 2022-07-23 DIAGNOSIS — D72829 Elevated white blood cell count, unspecified: Secondary | ICD-10-CM | POA: Diagnosis not present

## 2022-07-23 DIAGNOSIS — Z87898 Personal history of other specified conditions: Secondary | ICD-10-CM

## 2022-07-23 DIAGNOSIS — Z1152 Encounter for screening for COVID-19: Secondary | ICD-10-CM | POA: Insufficient documentation

## 2022-07-23 DIAGNOSIS — Z79899 Other long term (current) drug therapy: Secondary | ICD-10-CM | POA: Diagnosis not present

## 2022-07-23 DIAGNOSIS — E781 Pure hyperglyceridemia: Secondary | ICD-10-CM | POA: Diagnosis present

## 2022-07-23 DIAGNOSIS — I2510 Atherosclerotic heart disease of native coronary artery without angina pectoris: Secondary | ICD-10-CM | POA: Insufficient documentation

## 2022-07-23 DIAGNOSIS — E1165 Type 2 diabetes mellitus with hyperglycemia: Secondary | ICD-10-CM | POA: Diagnosis not present

## 2022-07-23 DIAGNOSIS — I959 Hypotension, unspecified: Secondary | ICD-10-CM

## 2022-07-23 DIAGNOSIS — F1090 Alcohol use, unspecified, uncomplicated: Secondary | ICD-10-CM | POA: Insufficient documentation

## 2022-07-23 DIAGNOSIS — I10 Essential (primary) hypertension: Secondary | ICD-10-CM | POA: Diagnosis not present

## 2022-07-23 DIAGNOSIS — Z8249 Family history of ischemic heart disease and other diseases of the circulatory system: Secondary | ICD-10-CM

## 2022-07-23 DIAGNOSIS — I214 Non-ST elevation (NSTEMI) myocardial infarction: Secondary | ICD-10-CM

## 2022-07-23 HISTORY — PX: CORONARY STENT INTERVENTION: CATH118234

## 2022-07-23 HISTORY — DX: Other ill-defined heart diseases: I51.89

## 2022-07-23 HISTORY — PX: LEFT HEART CATH AND CORONARY ANGIOGRAPHY: CATH118249

## 2022-07-23 HISTORY — DX: Tobacco use: Z72.0

## 2022-07-23 LAB — BASIC METABOLIC PANEL
Anion gap: 10 (ref 5–15)
BUN: 14 mg/dL (ref 6–20)
CO2: 23 mmol/L (ref 22–32)
Calcium: 7.9 mg/dL — ABNORMAL LOW (ref 8.9–10.3)
Chloride: 106 mmol/L (ref 98–111)
Creatinine, Ser: 0.85 mg/dL (ref 0.61–1.24)
GFR, Estimated: >60 mL/min (ref >60)
Glucose, Bld: 143 mg/dL — ABNORMAL HIGH (ref 70–99)
Potassium: 3.5 mmol/L (ref 3.5–5.1)
Sodium: 139 mmol/L (ref 135–145)

## 2022-07-23 LAB — PROTIME-INR
INR: 1.1 (ref 0.8–1.2)
Prothrombin Time: 13.6 seconds (ref 11.4–15.2)

## 2022-07-23 LAB — CBC WITH DIFFERENTIAL/PLATELET
Abs Immature Granulocytes: 0.15 10*3/uL — ABNORMAL HIGH (ref 0.00–0.07)
Basophils Absolute: 0.1 10*3/uL (ref 0.0–0.1)
Basophils Relative: 1 %
Eosinophils Absolute: 0.4 10*3/uL (ref 0.0–0.5)
Eosinophils Relative: 2 %
HCT: 52.3 % — ABNORMAL HIGH (ref 39.0–52.0)
Hemoglobin: 18.5 g/dL — ABNORMAL HIGH (ref 13.0–17.0)
Immature Granulocytes: 1 %
Lymphocytes Relative: 20 %
Lymphs Abs: 5.1 10*3/uL — ABNORMAL HIGH (ref 0.7–4.0)
MCH: 29.9 pg (ref 26.0–34.0)
MCHC: 35.4 g/dL (ref 30.0–36.0)
MCV: 84.5 fL (ref 80.0–100.0)
Monocytes Absolute: 1.3 10*3/uL — ABNORMAL HIGH (ref 0.1–1.0)
Monocytes Relative: 5 %
Neutro Abs: 18.1 10*3/uL — ABNORMAL HIGH (ref 1.7–7.7)
Neutrophils Relative %: 71 %
Platelets: 298 10*3/uL (ref 150–400)
RBC: 6.19 MIL/uL — ABNORMAL HIGH (ref 4.22–5.81)
RDW: 12.8 % (ref 11.5–15.5)
Smear Review: NORMAL
WBC: 25.2 10*3/uL — ABNORMAL HIGH (ref 4.0–10.5)
nRBC: 0 % (ref 0.0–0.2)

## 2022-07-23 LAB — URINALYSIS, ROUTINE W REFLEX MICROSCOPIC
Bilirubin Urine: NEGATIVE
Glucose, UA: >=500 mg/dL — AB
Hgb urine dipstick: NEGATIVE
Ketones, ur: NEGATIVE mg/dL
Leukocytes,Ua: NEGATIVE
Nitrite: NEGATIVE
Protein, ur: NEGATIVE mg/dL
Specific Gravity, Urine: >1.046 — ABNORMAL HIGH (ref 1.005–1.030)
Squamous Epithelial / HPF: NONE SEEN /HPF (ref 0–5)
pH: 6 (ref 5.0–8.0)

## 2022-07-23 LAB — COMPREHENSIVE METABOLIC PANEL
ALT: 30 U/L (ref 0–44)
AST: 49 U/L — ABNORMAL HIGH (ref 15–41)
Albumin: 3.9 g/dL (ref 3.5–5.0)
Alkaline Phosphatase: 71 U/L (ref 38–126)
Anion gap: 16 — ABNORMAL HIGH (ref 5–15)
BUN: 13 mg/dL (ref 6–20)
CO2: 21 mmol/L — ABNORMAL LOW (ref 22–32)
Calcium: 8.5 mg/dL — ABNORMAL LOW (ref 8.9–10.3)
Chloride: 100 mmol/L (ref 98–111)
Creatinine, Ser: 1.05 mg/dL (ref 0.61–1.24)
GFR, Estimated: >60 mL/min (ref >60)
Glucose, Bld: 252 mg/dL — ABNORMAL HIGH (ref 70–99)
Potassium: 2.7 mmol/L — CL (ref 3.5–5.1)
Sodium: 137 mmol/L (ref 135–145)
Total Bilirubin: 1.8 mg/dL — ABNORMAL HIGH (ref 0.3–1.2)
Total Protein: 6.8 g/dL (ref 6.5–8.1)

## 2022-07-23 LAB — TROPONIN I (HIGH SENSITIVITY)
Troponin I (High Sensitivity): 15 ng/L (ref <18)
Troponin I (High Sensitivity): 83 ng/L — ABNORMAL HIGH (ref <18)
Troponin I (High Sensitivity): 2777 ng/L (ref <18)
Troponin I (High Sensitivity): 6064 ng/L (ref <18)

## 2022-07-23 LAB — APTT: aPTT: 27 seconds (ref 24–36)

## 2022-07-23 LAB — C-REACTIVE PROTEIN: CRP: 1.5 mg/dL — ABNORMAL HIGH (ref <1.0)

## 2022-07-23 LAB — LIPASE, BLOOD: Lipase: 92 U/L — ABNORMAL HIGH (ref 11–51)

## 2022-07-23 LAB — MAGNESIUM: Magnesium: 1.9 mg/dL (ref 1.7–2.4)

## 2022-07-23 LAB — CBG MONITORING, ED
Glucose-Capillary: 198 mg/dL — ABNORMAL HIGH (ref 70–99)
Glucose-Capillary: 145 mg/dL — ABNORMAL HIGH (ref 70–99)

## 2022-07-23 LAB — LIPID PANEL
Cholesterol: 104 mg/dL (ref 0–200)
HDL: 27 mg/dL — ABNORMAL LOW (ref >40)
LDL Cholesterol: 33 mg/dL (ref 0–99)
Total CHOL/HDL Ratio: 3.9 RATIO
Triglycerides: 221 mg/dL — ABNORMAL HIGH (ref <150)
VLDL: 44 mg/dL — ABNORMAL HIGH (ref 0–40)

## 2022-07-23 LAB — POCT ACTIVATED CLOTTING TIME: Activated Clotting Time: 304 seconds

## 2022-07-23 LAB — RESP PANEL BY RT-PCR (RSV, FLU A&B, COVID)  RVPGX2
Influenza A by PCR: NEGATIVE
Influenza B by PCR: NEGATIVE
Resp Syncytial Virus by PCR: NEGATIVE
SARS Coronavirus 2 by RT PCR: NEGATIVE

## 2022-07-23 LAB — URINE DRUG SCREEN, QUALITATIVE (ARMC ONLY)
Amphetamines, Ur Screen: NOT DETECTED
Barbiturates, Ur Screen: NOT DETECTED
Benzodiazepine, Ur Scrn: NOT DETECTED
Cannabinoid 50 Ng, Ur ~~LOC~~: POSITIVE — AB
Cocaine Metabolite,Ur ~~LOC~~: NOT DETECTED
MDMA (Ecstasy)Ur Screen: NOT DETECTED
Methadone Scn, Ur: NOT DETECTED
Opiate, Ur Screen: NOT DETECTED
Phencyclidine (PCP) Ur S: NOT DETECTED
Tricyclic, Ur Screen: NOT DETECTED

## 2022-07-23 LAB — HEMOGLOBIN A1C
Hgb A1c MFr Bld: 6.6 % — ABNORMAL HIGH (ref 4.8–5.6)
Mean Plasma Glucose: 142.72 mg/dL

## 2022-07-23 LAB — ETHANOL: Alcohol, Ethyl (B): <10 mg/dL (ref <10)

## 2022-07-23 LAB — GLUCOSE, CAPILLARY
Glucose-Capillary: 109 mg/dL — ABNORMAL HIGH (ref 70–99)
Glucose-Capillary: 212 mg/dL — ABNORMAL HIGH (ref 70–99)
Glucose-Capillary: 158 mg/dL — ABNORMAL HIGH (ref 70–99)

## 2022-07-23 LAB — HIV ANTIBODY (ROUTINE TESTING W REFLEX): HIV Screen 4th Generation wRfx: NONREACTIVE

## 2022-07-23 LAB — SEDIMENTATION RATE: Sed Rate: 2 mm/hr (ref 0–20)

## 2022-07-23 SURGERY — LEFT HEART CATH AND CORONARY ANGIOGRAPHY
Anesthesia: Moderate Sedation

## 2022-07-23 MED ORDER — IOHEXOL 300 MG/ML  SOLN
INTRAMUSCULAR | Status: DC | PRN
  Administered 2022-07-23: 139 mL

## 2022-07-23 MED ORDER — CARVEDILOL 6.25 MG PO TABS
6.2500 mg | ORAL_TABLET | Freq: Two times a day (BID) | ORAL | Status: DC
  Administered 2022-07-24: 6.25 mg via ORAL
  Filled 2022-07-23 (×7): qty 1

## 2022-07-23 MED ORDER — ATORVASTATIN CALCIUM 20 MG PO TABS
40.0000 mg | ORAL_TABLET | Freq: Every day | ORAL | Status: DC
  Administered 2022-07-24: 40 mg via ORAL
  Filled 2022-07-23: qty 2

## 2022-07-23 MED ORDER — ENOXAPARIN SODIUM 40 MG/0.4ML IJ SOSY: 40.0000 mg | PREFILLED_SYRINGE | INTRAMUSCULAR | Status: DC

## 2022-07-23 MED ORDER — ONDANSETRON HCL 4 MG/2ML IJ SOLN: 4.0000 mg | Freq: Four times a day (QID) | INTRAMUSCULAR | Status: DC | PRN

## 2022-07-23 MED ORDER — NITROGLYCERIN 0.4 MG SL SUBL: 0.4000 mg | SUBLINGUAL_TABLET | SUBLINGUAL | Status: DC | PRN

## 2022-07-23 MED ORDER — THIAMINE MONONITRATE 100 MG PO TABS
100.0000 mg | ORAL_TABLET | Freq: Every day | ORAL | Status: DC
  Administered 2022-07-24: 100 mg via ORAL
  Filled 2022-07-23: qty 1

## 2022-07-23 MED ORDER — VERAPAMIL HCL 2.5 MG/ML IV SOLN
INTRAVENOUS | Status: AC
  Filled 2022-07-23: qty 2

## 2022-07-23 MED ORDER — LORAZEPAM 2 MG/ML IJ SOLN: 0.0000 mg | Freq: Four times a day (QID) | INTRAMUSCULAR | Status: DC

## 2022-07-23 MED ORDER — SODIUM CHLORIDE 0.9 % IV SOLN: INTRAVENOUS | Status: DC

## 2022-07-23 MED ORDER — HEPARIN (PORCINE) IN NACL 1000-0.9 UT/500ML-% IV SOLN
INTRAVENOUS | Status: DC | PRN
  Administered 2022-07-23 (×2): 500 mL

## 2022-07-23 MED ORDER — SODIUM CHLORIDE 0.9 % IV BOLUS
1000.0000 mL | Freq: Once | INTRAVENOUS | Status: AC
  Administered 2022-07-23: 1000 mL via INTRAVENOUS

## 2022-07-23 MED ORDER — CARVEDILOL 6.25 MG PO TABS: 3.1250 mg | ORAL_TABLET | Freq: Two times a day (BID) | ORAL | Status: DC

## 2022-07-23 MED ORDER — GABAPENTIN 300 MG PO CAPS: 300.0000 mg | ORAL_CAPSULE | Freq: Once | ORAL | Status: DC

## 2022-07-23 MED ORDER — GABAPENTIN 400 MG PO CAPS
800.0000 mg | ORAL_CAPSULE | Freq: Three times a day (TID) | ORAL | Status: DC
  Administered 2022-07-23 – 2022-07-24 (×2): 800 mg via ORAL
  Filled 2022-07-23 (×2): qty 2

## 2022-07-23 MED ORDER — NITROGLYCERIN 1 MG/10 ML FOR IR/CATH LAB
INTRA_ARTERIAL | Status: AC
  Filled 2022-07-23: qty 10

## 2022-07-23 MED ORDER — ASPIRIN 81 MG PO CHEW
81.0000 mg | CHEWABLE_TABLET | ORAL | Status: AC
  Administered 2022-07-23: 81 mg via ORAL

## 2022-07-23 MED ORDER — MIDAZOLAM HCL 2 MG/2ML IJ SOLN
INTRAMUSCULAR | Status: DC | PRN
  Administered 2022-07-23 (×2): 1 mg via INTRAVENOUS

## 2022-07-23 MED ORDER — SODIUM CHLORIDE 0.9 % WEIGHT BASED INFUSION
3.0000 mL/kg/h | INTRAVENOUS | Status: DC
  Administered 2022-07-23: 3 mL/kg/h via INTRAVENOUS

## 2022-07-23 MED ORDER — SODIUM CHLORIDE 0.9 % WEIGHT BASED INFUSION
1.0000 mL/kg/h | INTRAVENOUS | Status: AC
  Administered 2022-07-23: 1 mL/kg/h via INTRAVENOUS

## 2022-07-23 MED ORDER — ASPIRIN 300 MG RE SUPP: 300.0000 mg | RECTAL | Status: AC

## 2022-07-23 MED ORDER — TICAGRELOR 90 MG PO TABS
ORAL_TABLET | ORAL | Status: DC | PRN
  Administered 2022-07-23: 180 mg via ORAL

## 2022-07-23 MED ORDER — SODIUM CHLORIDE 0.9% FLUSH
3.0000 mL | Freq: Two times a day (BID) | INTRAVENOUS | Status: DC
  Administered 2022-07-23: 3 mL via INTRAVENOUS

## 2022-07-23 MED ORDER — HEPARIN (PORCINE) 25000 UT/250ML-% IV SOLN
1200.0000 [IU]/h | INTRAVENOUS | Status: DC
  Administered 2022-07-23: 1200 [IU]/h via INTRAVENOUS
  Filled 2022-07-23: qty 250

## 2022-07-23 MED ORDER — NICOTINE 21 MG/24HR TD PT24
21.0000 mg | MEDICATED_PATCH | Freq: Every day | TRANSDERMAL | Status: DC
  Administered 2022-07-24: 21 mg via TRANSDERMAL
  Filled 2022-07-23: qty 1

## 2022-07-23 MED ORDER — SODIUM CHLORIDE 0.9% FLUSH
3.0000 mL | Freq: Two times a day (BID) | INTRAVENOUS | Status: DC
  Administered 2022-07-23 – 2022-07-24 (×2): 3 mL via INTRAVENOUS

## 2022-07-23 MED ORDER — POTASSIUM CHLORIDE 10 MEQ/100ML IV SOLN
10.0000 meq | Freq: Once | INTRAVENOUS | Status: AC
  Administered 2022-07-23: 10 meq via INTRAVENOUS
  Filled 2022-07-23: qty 100

## 2022-07-23 MED ORDER — FENTANYL CITRATE (PF) 100 MCG/2ML IJ SOLN
INTRAMUSCULAR | Status: AC
  Filled 2022-07-23: qty 2

## 2022-07-23 MED ORDER — SODIUM CHLORIDE 0.9 % IV SOLN: 250.0000 mL | INTRAVENOUS | Status: DC | PRN

## 2022-07-23 MED ORDER — SODIUM CHLORIDE 0.9% FLUSH: 3.0000 mL | INTRAVENOUS | Status: DC | PRN

## 2022-07-23 MED ORDER — LORAZEPAM 1 MG PO TABS: 1.0000 mg | ORAL_TABLET | ORAL | Status: DC | PRN

## 2022-07-23 MED ORDER — HEPARIN SODIUM (PORCINE) 1000 UNIT/ML IJ SOLN
INTRAMUSCULAR | Status: DC | PRN
  Administered 2022-07-23 (×2): 4500 [IU] via INTRAVENOUS

## 2022-07-23 MED ORDER — MIDAZOLAM HCL 2 MG/2ML IJ SOLN
INTRAMUSCULAR | Status: AC
  Filled 2022-07-23: qty 2

## 2022-07-23 MED ORDER — VERAPAMIL HCL 2.5 MG/ML IV SOLN
INTRAVENOUS | Status: DC | PRN
  Administered 2022-07-23: 2.5 mg via INTRA_ARTERIAL

## 2022-07-23 MED ORDER — ASPIRIN 81 MG PO CHEW: 324.0000 mg | CHEWABLE_TABLET | ORAL | Status: AC

## 2022-07-23 MED ORDER — ADULT MULTIVITAMIN W/MINERALS CH
1.0000 | ORAL_TABLET | Freq: Every day | ORAL | Status: DC
  Administered 2022-07-24: 1 via ORAL
  Filled 2022-07-23: qty 1

## 2022-07-23 MED ORDER — THIAMINE HCL 100 MG/ML IJ SOLN
100.0000 mg | Freq: Every day | INTRAMUSCULAR | Status: DC
  Filled 2022-07-23: qty 2

## 2022-07-23 MED ORDER — FENTANYL CITRATE (PF) 100 MCG/2ML IJ SOLN
INTRAMUSCULAR | Status: DC | PRN
  Administered 2022-07-23: 25 ug via INTRAVENOUS
  Administered 2022-07-23: 50 ug via INTRAVENOUS

## 2022-07-23 MED ORDER — TICAGRELOR 90 MG PO TABS
90.0000 mg | ORAL_TABLET | Freq: Two times a day (BID) | ORAL | Status: DC
  Administered 2022-07-23 – 2022-07-24 (×2): 90 mg via ORAL
  Filled 2022-07-23 (×2): qty 1

## 2022-07-23 MED ORDER — TICAGRELOR 90 MG PO TABS
ORAL_TABLET | ORAL | Status: AC
  Filled 2022-07-23: qty 2

## 2022-07-23 MED ORDER — GABAPENTIN 300 MG PO CAPS
300.0000 mg | ORAL_CAPSULE | Freq: Three times a day (TID) | ORAL | Status: DC
  Filled 2022-07-23 (×2): qty 1

## 2022-07-23 MED ORDER — FENOFIBRATE 160 MG PO TABS
160.0000 mg | ORAL_TABLET | Freq: Every day | ORAL | Status: DC
  Administered 2022-07-24: 160 mg via ORAL
  Filled 2022-07-23: qty 1

## 2022-07-23 MED ORDER — HEPARIN SODIUM (PORCINE) 1000 UNIT/ML IJ SOLN
INTRAMUSCULAR | Status: AC
  Filled 2022-07-23: qty 10

## 2022-07-23 MED ORDER — INSULIN ASPART 100 UNIT/ML IJ SOLN
0.0000 [IU] | INTRAMUSCULAR | Status: DC
  Administered 2022-07-23 (×2): 3 [IU] via SUBCUTANEOUS
  Administered 2022-07-23: 5 [IU] via SUBCUTANEOUS
  Administered 2022-07-24: 2 [IU] via SUBCUTANEOUS
  Filled 2022-07-23 (×4): qty 1

## 2022-07-23 MED ORDER — NITROGLYCERIN 1 MG/10 ML FOR IR/CATH LAB
INTRA_ARTERIAL | Status: DC | PRN
  Administered 2022-07-23: 200 ug via INTRACORONARY

## 2022-07-23 MED ORDER — SODIUM CHLORIDE 0.9 % WEIGHT BASED INFUSION: 1.0000 mL/kg/h | INTRAVENOUS | Status: DC

## 2022-07-23 MED ORDER — IOHEXOL 350 MG/ML SOLN
100.0000 mL | Freq: Once | INTRAVENOUS | Status: AC | PRN
  Administered 2022-07-23: 100 mL via INTRAVENOUS

## 2022-07-23 MED ORDER — FENTANYL CITRATE PF 50 MCG/ML IJ SOSY
PREFILLED_SYRINGE | INTRAMUSCULAR | Status: AC
  Administered 2022-07-23: 50 ug via INTRAVENOUS
  Filled 2022-07-23: qty 1

## 2022-07-23 MED ORDER — LORAZEPAM 2 MG/ML IJ SOLN: 0.0000 mg | Freq: Two times a day (BID) | INTRAMUSCULAR | Status: DC

## 2022-07-23 MED ORDER — ASPIRIN 81 MG PO TBEC
81.0000 mg | DELAYED_RELEASE_TABLET | Freq: Every day | ORAL | Status: DC
  Administered 2022-07-24: 81 mg via ORAL
  Filled 2022-07-23: qty 1

## 2022-07-23 MED ORDER — PANTOPRAZOLE SODIUM 40 MG PO TBEC
40.0000 mg | DELAYED_RELEASE_TABLET | Freq: Every day | ORAL | Status: DC
  Administered 2022-07-23 – 2022-07-24 (×2): 40 mg via ORAL
  Filled 2022-07-23 (×3): qty 1

## 2022-07-23 MED ORDER — HEPARIN BOLUS VIA INFUSION
4000.0000 [IU] | Freq: Once | INTRAVENOUS | Status: AC
  Administered 2022-07-23: 4000 [IU] via INTRAVENOUS
  Filled 2022-07-23: qty 4000

## 2022-07-23 MED ORDER — MORPHINE SULFATE (PF) 4 MG/ML IV SOLN
4.0000 mg | Freq: Once | INTRAVENOUS | Status: DC
  Filled 2022-07-23: qty 1

## 2022-07-23 MED ORDER — FENTANYL CITRATE PF 50 MCG/ML IJ SOSY
50.0000 ug | PREFILLED_SYRINGE | Freq: Once | INTRAMUSCULAR | Status: AC
  Administered 2022-07-23: 50 ug via INTRAVENOUS
  Filled 2022-07-23: qty 1

## 2022-07-23 MED ORDER — FENTANYL CITRATE PF 50 MCG/ML IJ SOSY: 50.0000 ug | PREFILLED_SYRINGE | Freq: Once | INTRAMUSCULAR | Status: AC

## 2022-07-23 MED ORDER — FOLIC ACID 1 MG PO TABS
1.0000 mg | ORAL_TABLET | Freq: Every day | ORAL | Status: DC
  Administered 2022-07-24: 1 mg via ORAL
  Filled 2022-07-23: qty 1

## 2022-07-23 MED ORDER — HEPARIN (PORCINE) IN NACL 1000-0.9 UT/500ML-% IV SOLN
INTRAVENOUS | Status: AC
  Filled 2022-07-23: qty 1000

## 2022-07-23 MED ORDER — ASPIRIN 81 MG PO CHEW
CHEWABLE_TABLET | ORAL | Status: AC
  Filled 2022-07-23: qty 1

## 2022-07-23 MED ORDER — ACETAMINOPHEN 325 MG PO TABS
650.0000 mg | ORAL_TABLET | ORAL | Status: DC | PRN
  Administered 2022-07-24: 650 mg via ORAL
  Filled 2022-07-23: qty 2

## 2022-07-23 SURGICAL SUPPLY — 19 items
BALLN TREK RX 2.5X15 (BALLOONS) ×1
BALLN ~~LOC~~ TREK NEO RX 3.0X15 (BALLOONS) ×1
BALLOON TREK RX 2.5X15 (BALLOONS) IMPLANT
BALLOON ~~LOC~~ TREK NEO RX 3.0X15 (BALLOONS) IMPLANT
CATH INFINITI 5FR JK (CATHETERS) IMPLANT
CATH LAUNCHER 6FR EBU3.5 (CATHETERS) IMPLANT
DEVICE RAD TR BAND REGULAR (VASCULAR PRODUCTS) IMPLANT
DRAPE BRACHIAL (DRAPES) IMPLANT
GLIDESHEATH SLEND SS 6F .021 (SHEATH) IMPLANT
GUIDEWIRE INQWIRE 1.5J.035X260 (WIRE) IMPLANT
INQWIRE 1.5J .035X260CM (WIRE) ×1
KIT ENCORE 26 ADVANTAGE (KITS) IMPLANT
PACK CARDIAC CATH (CUSTOM PROCEDURE TRAY) ×1 IMPLANT
PROTECTION STATION PRESSURIZED (MISCELLANEOUS) ×1
SET ATX SIMPLICITY (MISCELLANEOUS) IMPLANT
STATION PROTECTION PRESSURIZED (MISCELLANEOUS) IMPLANT
STENT ONYX FRONTIER 2.75X22 (Permanent Stent) IMPLANT
TUBING CIL FLEX 10 FLL-RA (TUBING) IMPLANT
WIRE RUNTHROUGH .014X180CM (WIRE) IMPLANT

## 2022-07-23 NOTE — Assessment & Plan Note (Signed)
History of severe hypertriglyceridemia in the thousands Will check triglyceride level

## 2022-07-23 NOTE — Assessment & Plan Note (Signed)
Monitor for signs of withdrawal

## 2022-07-23 NOTE — Progress Notes (Addendum)
Interval events noted.  He has no complaints and he feels better.  No chest pain or shortness of breath.  Vital signs are stable.  Blood pressure is better.  He has been admitted for acute NSTEMI.  He was treated with IV heparin drip, aspirin and statin.  Hypokalemia has been repleted.  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  Successful angioplasty and drug-eluting stent stent placement to mid left circumflex was performed.  Dual antiplatelet therapy for at least 12 months recommended by cardiologist. Plan discussed with his mother at the bedside.

## 2022-07-23 NOTE — Assessment & Plan Note (Signed)
Nicotine patch

## 2022-07-23 NOTE — Assessment & Plan Note (Addendum)
Family history of premature CAD First troponin of 15 and EKG nonacute CT chest shows marked severity coronary artery calcification with no acute findings Follow sed rate/CRP Chewable aspirin, nitroglycerin, morphine for pain Cardiology consult in view of risk factors Continue to trend troponin--> 15>83  heparin infusion started for high risk chest pain

## 2022-07-23 NOTE — Assessment & Plan Note (Signed)
IV repletion started in the ED for potassium 2.4 Continue to monitor and replete as necessary

## 2022-07-23 NOTE — Consult Note (Signed)
Cardiology Consult    Patient ID: Donald Hart MRN: [id number], DOB/AGE: Aug 06, 1970   Admit date: 07/23/2022 Date of Consult: 07/23/2022  Primary Physician: [facility] Primary Cardiologist: Kathlyn Sacramento, MD - new Requesting Provider: B. Mal Misty, MD  Patient Profile    Donald Hart is a 52 y.o. male with a history of pancreatitis, chronic hypertriglyceridemia, remote ETOH abuse, tob abuse, DMII, HTN, HL, gout, chronic back pain, GERD, and FH of premature CAD, who is being seen today for the evaluation of chest pain and HsTrop elevation at the request of Dr. Mal Misty.  Past Medical History   Past Medical History:  Diagnosis Date   Acute pancreatitis 07/02/2015   hospitalization at [facility]   Chronic back pain    Diastolic dysfunction    a. 01/2016 Echo: EF 60-65%, GrI DD. Mild LVH, nl RV fxn, mildly dil LA.   Family history of diabetes mellitus    sister   Family history of heart attack    Father.    Family history of stomach cancer    Gout    Hypertension    Hypertriglyceridemia    Remote Alcohol abuse    Tobacco abuse    Type 2 diabetes mellitus ([place]) 07/02/2015    Past Surgical History:  Procedure Laterality Date   NO PAST SURGERIES       Allergies  No Known Allergies  History of Present Illness    52 year old male with a history of pancreatitis, chronic hypertriglyceridemia, remote alcohol abuse, tobacco abuse, diabetes, hypertension, hyperlipidemia, gout, chronic back pain, GERD, and family history of premature CAD.  In the setting of heavy alcohol use, in August 2017, he was admitted to [facility] with hypertriglyceridemia (6751) requiring plasmapheresis and insulin drip, complicated by distributive shock and respiratory failure requiring intubation, further complicated by MRSA pneumonia, bacteremia, AKI, and deconditioning.  Echocardiogram during that admission showed an EF of 60 to 65% with grade 1 diastolic dysfunction.  He was  readmitted with pancreatitis and hypertriglyceridemia in 2018, at which time he was evaluated by our team for elevated triglycerides with recommendation for outpatient endocrinology follow-up.  Since that admission, [name] has quit drinking.  He still smokes cigarettes but is down to about 1 pack/week and has instead been vaping regularly.  He lives locally with his mother.  He works at a [employer] and walks quite a bit at work, but does not otherwise routinely exercise.  He no longer requires insulin and reports that his most recent A1c was around 8.0, which was up from a prior reading.  He is on Jardiance only as an outpatient.  Over the past year, [name] has been experiencing intermittent rest and exertional substernal chest tightness associated with dyspnea.  When this occurs while walking at work, he will sit and rest for about 10 minutes prior to resolution of symptoms.  On a few occasions, this has occurred while lying down in bed, lasting 10 to 15 minutes, resolving spontaneously.  On the evening of January 22, he had just readied himself for bed and was laying down when he suddenly felt itchy all over his body.  This persisted for several minutes and then he says his head became very itchy.  This was followed by severe substernal chest tightness and squeezing with associated dyspnea.  The entire constellation of symptoms persisted for at least an hour prior to driving to the the [facility] ED.  He did take aspirin at home without relief.  Here, ECG  was unremarkable.  BP was 63/31 on arrival but records indicate that this improved to 114/91 within 1 minute.  Initial troponin was normal at 15.  H&H 18.5/52.3 with a white count of 25.2.  He was hypokalemic with potassium of 2.7.  Potassium was supplemented and he was given fentanyl for ongoing pain.  CT angiogram of the chest was performed and was negative for PE but showed severe coronary calcifications, aortic atherosclerosis, and bilateral  fat-containing inguinal hernias.  Subsequent troponin rose to 83.  He was placed on intravenous heparin and has since been chest pain-free.  Inpatient Medications     aspirin  324 mg Oral NOW   Or   aspirin  300 mg Rectal NOW   [START ON 07/24/2022] aspirin EC  81 mg Oral Daily   atorvastatin  40 mg Oral Daily   fenofibrate  160 mg Oral Daily   folic acid  1 mg Oral Daily   insulin aspart  0-15 Units Subcutaneous Q4H   LORazepam  0-4 mg Intravenous Q6H   Followed by   Melene Muller ON 07/25/2022] LORazepam  0-4 mg Intravenous Q12H   multivitamin with minerals  1 tablet Oral Daily   nicotine  21 mg Transdermal Daily   sodium chloride flush  3 mL Intravenous Q12H   thiamine  100 mg Oral Daily   Or   thiamine  100 mg Intravenous Daily    Family History    Family History  Adopted: Yes  Problem Relation Age of Onset   Heart attack Father 80 - 79   Stomach cancer Father    Diabetes Father    Diabetes Sister    Diabetes Sister    is adopted.    Social History    Social History   Socioeconomic History   Marital status: Legally Separated    Spouse name: Not on file   Number of children: Not on file   Years of education: Not on file   Highest education level: Not on file  Occupational History   Not on file  Tobacco Use   Smoking status: Every Day    Packs/day: 1.00    Years: 20.00    Total pack years: 20.00    Types: Cigarettes   Smokeless tobacco: Never   Tobacco comments:    Currently -a pack of cigarettes lasts about a week though he vapes regularly throughout the week.  Vaping Use   Vaping Use: Every day  Substance and Sexual Activity   Alcohol use: Not Currently    Alcohol/week: 12.0 standard drinks of alcohol    Types: 12 Cans of beer per week    Comment: none since 2018   Drug use: No   Sexual activity: Yes    Partners: Male    Birth control/protection: None  Other Topics Concern   Not on file  Social History Narrative   [place] mother.  Works in a [employer].  Does not routinely exercise.   Social Determinants of Health   Financial Resource Strain: Not on file  Food Insecurity: Not on file  Transportation Needs: Not on file  Physical Activity: Not on file  Stress: Not on file  Social Connections: Not on file  Intimate Partner Violence: Not on file     Review of Systems    General:  Diffuse body itching on presentation.  No chills, fever, night sweats or weight changes.  Cardiovascular:  +++ chest pain, +++ dyspnea on exertion, no edema,  orthopnea, palpitations, paroxysmal nocturnal dyspnea. Dermatological: No rash, lesions/masses Respiratory: No cough, dyspnea Urologic: No hematuria, dysuria Abdominal:   No nausea, vomiting, diarrhea, bright red blood per rectum, melena, or hematemesis Neurologic:  No visual changes, wkns, changes in mental status. All other systems reviewed and are otherwise negative except as noted above.  Physical Exam    Blood pressure (!) 147/92, pulse 66, temperature 97.7 F (36.5 C), temperature source Oral, resp. rate 14, height 6\' 2"  (1.88 m), weight 93 kg, SpO2 99 %.  General: Pleasant, NAD Psych: Normal affect. Neuro: Alert and oriented X 3. Moves all extremities spontaneously. HEENT: Normal  Neck: Supple without bruits or JVD. Lungs:  Resp regular and unlabored, CTA. Heart: RRR no s3, s4, or murmurs. Abdomen: Soft, non-tender, non-distended, BS + x 4.  Extremities: No clubbing, cyanosis or edema. DP/PT2+, Radials 2+ and equal bilaterally.  Labs    Cardiac Enzymes Recent Labs  Lab 07/23/22 0129 07/23/22 0333  TROPONINIHS 15 83*     BNP    Component Value Date/Time   BNP 30.0 11/18/2015 1038    Lab Results  Component Value Date   WBC 25.2 (H) 07/23/2022   HGB 18.5 (H) 07/23/2022   HCT 52.3 (H) 07/23/2022   MCV 84.5 07/23/2022   PLT 298 07/23/2022    Recent Labs  Lab 07/23/22 0129  NA 137  K 2.7*  CL 100  CO2 21*  BUN 13  CREATININE 1.05   CALCIUM 8.5*  PROT 6.8  BILITOT 1.8*  ALKPHOS 71  ALT 30  AST 49*  GLUCOSE 252*   Lab Results  Component Value Date   CHOL 104 07/23/2022   HDL 27 (L) 07/23/2022   LDLCALC 33 07/23/2022   TRIG 221 (H) 07/23/2022     Radiology Studies    CT Angio Chest/Abd/Pel for Dissection W and/or Wo Contrast  Result Date: 07/23/2022 CLINICAL DATA:  Chest pain and shortness of breath. EXAM: CT ANGIOGRAPHY CHEST, ABDOMEN AND PELVIS TECHNIQUE: Non-contrast CT of the chest was initially obtained. Multidetector CT imaging through the chest, abdomen and pelvis was performed using the standard protocol during bolus administration of intravenous contrast. Multiplanar reconstructed images and MIPs were obtained and reviewed to evaluate the vascular anatomy. RADIATION DOSE REDUCTION: This exam was performed according to the departmental dose-optimization program which includes automated exposure control, adjustment of the mA and/or kV according to patient size and/or use of iterative reconstruction technique. CONTRAST:  07/25/2022 OMNIPAQUE IOHEXOL 350 MG/ML SOLN COMPARISON:  January 30, 2018 FINDINGS: CTA CHEST FINDINGS Cardiovascular: There is mild calcification of the aortic arch and descending thoracic aorta, without evidence of thoracic aortic aneurysm or dissection. Satisfactory opacification of the pulmonary arteries to the segmental level. No evidence of pulmonary embolism. Normal heart size with marked severity coronary artery calcification. No pericardial effusion. Mediastinum/Nodes: No enlarged mediastinal, hilar, or axillary lymph nodes. Thyroid gland, trachea, and esophagus demonstrate no significant findings. Lungs/Pleura: Lungs are clear. No pleural effusion or pneumothorax. Musculoskeletal: Multilevel degenerative changes are seen throughout the thoracic spine. Review of the MIP images confirms the above findings. CTA ABDOMEN AND PELVIS FINDINGS VASCULAR Aorta: Moderate severity calcification and  atherosclerosis of a normal caliber aorta without aneurysm, dissection, vasculitis or significant stenosis. Celiac: Patent without evidence of aneurysm, dissection, vasculitis or significant stenosis. SMA: Patent without evidence of aneurysm, dissection, vasculitis or significant stenosis. Renals: Both renal arteries are patent without evidence of aneurysm, dissection, vasculitis, fibromuscular dysplasia or significant stenosis. IMA: Patent without evidence of aneurysm, dissection, vasculitis  or significant stenosis. Inflow: Moderate to marked severity calcification and atherosclerosis, without evidence of aneurysm, dissection, vasculitis or significant stenosis. Veins: No obvious venous abnormality within the limitations of this arterial phase study. Review of the MIP images confirms the above findings. NON-VASCULAR Hepatobiliary: No focal liver abnormality is seen. No gallstones, gallbladder wall thickening, or biliary dilatation. Pancreas: Unremarkable. No pancreatic ductal dilatation or surrounding inflammatory changes. Spleen: Normal in size without focal abnormality. Adrenals/Urinary Tract: Adrenal glands are unremarkable. Kidneys are normal in size, without renal calculi or hydronephrosis. A 2.0 cm x 1.4 cm simple cyst is seen within the lateral aspect of the mid right kidney. Bladder is unremarkable. Stomach/Bowel: Stomach is within normal limits. Appendix appears normal. No evidence of bowel wall thickening, distention, or inflammatory changes. Lymphatic: No abnormal abdominal or pelvic lymph nodes are identified. Reproductive: Prostate is unremarkable. Other: A 3.0 cm x 2.4 cm fat containing right inguinal hernia is seen. An additional 2.5 cm x 2.1 cm fat containing left inguinal hernia is also noted. No abdominopelvic ascites. Musculoskeletal: Multilevel degenerative changes are noted within the lumbar spine. Review of the MIP images confirms the above findings. IMPRESSION: 1. No evidence of pulmonary  embolism or other acute intrathoracic process. 2. Marked severity coronary artery calcification. 3. Bilateral fat containing inguinal hernias. 4. Aortic atherosclerosis without evidence of aneurysmal dilatation, dissection, vasculitis or significant stenosis. Aortic Atherosclerosis (ICD10-I70.0). Electronically Signed   By: Aram Candela M.D.   On: 07/23/2022 03:04   DG Chest Port 1 View  Result Date: 07/23/2022 CLINICAL DATA:  Severe chest pain and shortness of breath. EXAM: PORTABLE CHEST 1 VIEW COMPARISON:  September 20, 2016 FINDINGS: The heart size and mediastinal contours are within normal limits. Low lung volumes are noted. There is no evidence of an acute infiltrate, pleural effusion or pneumothorax. Multilevel degenerative changes are seen throughout the thoracic spine. IMPRESSION: No active disease. Electronically Signed   By: Aram Candela M.D.   On: 07/23/2022 02:50    ECG & Cardiac Imaging    RSR, 68, LAE, no acute ST/T changes - personally reviewed.  Assessment & Plan    1.  Unstable Angina/? NSTEMI: Patient with a family history of premature CAD with his father being diagnosed in his 27s with subsequent CABG and multiple stents prior to dying around 58.  Patient also with multiple risk factors including hypertension, hyperlipidemia, hypertriglyceridemia, and ongoing tobacco abuse.  Over the past year, has been experiencing intermittent rest and exertional substernal chest tightness and squeezing associated with dyspnea, typically lasting about 10 minutes, and resolving with rest.  He had a more profound episode on January 22 that was preceded by severe diffuse body itching.  Upon presentation to the emergency department, ECG was unremarkable.  HsTrop 15  83.  CTA chest w/o PE but cor/Ao Ca2+/atherosclerosis noted.  He is currently chest pain-free.  Continue aspirin, statin, and heparin.  Add low-dose beta-blocker (reportedly on carvedilol at home).  Echo pending.  We discussed options  for management and given risk factors and presentation, will plan to pursue diagnostic catheterization today. The patient understands that risks include but are not limited to stroke (1 in 1000), death (1 in 1000), kidney failure [usually temporary] (1 in 500), bleeding (1 in 200), allergic reaction [possibly serious] (1 in 200), and agrees to proceed.    2.  Hypotension/H/o HTN: Hypotensive on arrival.  Blood pressure currently stable and trending high.  Records indicate that he is on Carvedilol, amlodipine, and losartan @ home, though  he's not sure that he's taking carvedilol - adding low-dose in setting of #1.  3.  HL/HTG:  LDL of 33 w/ TG 221.  On atorvastatin, niacin, lovaza, fenofibrate @ home.  4.  Hypokalemia:  K 2.7 on presentation.  Has received supplementation.  F/u Mg, repeat bmet.  5.  Pancytopenia:  H/H 18.5/52.3 w/ WBC 25.2.  Afebrile, non-toxic appearing. No infectious abnormalities noted on CTA chest.  6.  Tob Abuse/ Vape use:  cessation advised.  Risk Assessment/Risk Scores:     TIMI Risk Score for Unstable Angina or Non-ST Elevation MI:   The patient's TIMI risk score is  , which indicates a  % risk of all cause mortality, new or recurrent myocardial infarction or need for urgent revascularization in the next 14 days.      Signed, Murray Hodgkins, NP 07/23/2022, 9:32 AM  For questions or updates, please contact   Please consult [email or web address] for contact info under Cardiology/STEMI.

## 2022-07-23 NOTE — Assessment & Plan Note (Signed)
Blood sugar 252 Sliding scale coverage

## 2022-07-23 NOTE — ED Provider Notes (Signed)
[facility] Provider Note    Event Date/Time   First MD Initiated Contact with Patient 07/23/22 0131     (approximate)   History   Allergic Reaction and Chest Pain   HPI  Donald Hart is a 52 y.o. male with a history of hypertension, diabetes, chronic pancreatitis, and polysubstance abuse who presents with chest pain and itching.  The patient states that earlier this evening he started having acute onset of generalized itching that started in his legs and spread to the rest of his body.  Subsequently he started having chest pain which is central and pressure-like.  It is associated with lightheadedness.  He received aspirin and Benadryl from EMS and states that the itching is better but is still having the chest pain.  He states he has had this kind of pain previously.  He denies any abdominal pain.  He states he has not been drinking alcohol and has not used drugs in 4 years.  I reviewed the past medical records.  The patient's most recent ED visit was in March 2023 for arm pain after falling out of bed.  He was most recently admitted in 2018 for acute on chronic pancreatitis thought to be secondary to hypertriglyceridemia and alcohol abuse.   Physical Exam   Triage Vital Signs: ED Triage Vitals  Enc Vitals Group     BP 07/23/22 0116 (!) 114/91     Pulse Rate 07/23/22 0116 88     Resp 07/23/22 0116 20     Temp --      Temp src --      SpO2 07/23/22 0116 98 %     Weight 07/23/22 0117 205 lb (93 kg)     Height 07/23/22 0117 6\' 2"  (1.88 m)     Head Circumference --      Peak Flow --      Pain Score 07/23/22 0117 10     Pain Loc --      Pain Edu? --      Excl. in GC? --     Most recent vital signs: Vitals:   07/23/22 0300 07/23/22 0330  BP: 102/69 94/69  Pulse: 78 60  Resp: (!) 7 11  Temp:    SpO2: 93% 94%     General: Alert but tired appearing, no acute distress. CV:  Good peripheral perfusion.  Normal distal pulses in all  extremities. Resp:  Normal effort.  Lungs CTAB. Abd:  Soft with no focal tenderness.  No distention.  Other:  EOMI.  Motor intact in all extremities.  Normal speech.  Diffuse erythema to the skin with no hives or lesions.  No lower extremity edema.  No calf or popliteal swelling or tenderness.   ED Results / Procedures / Treatments   Labs (all labs ordered are listed, but only abnormal results are displayed) Labs Reviewed  COMPREHENSIVE METABOLIC PANEL - Abnormal; Notable for the following components:      Result Value   Potassium 2.7 (*)    CO2 21 (*)    Glucose, Bld 252 (*)    Calcium 8.5 (*)    AST 49 (*)    Total Bilirubin 1.8 (*)    Anion gap 16 (*)    All other components within normal limits  CBC WITH DIFFERENTIAL/PLATELET - Abnormal; Notable for the following components:   WBC 25.2 (*)    RBC 6.19 (*)    Hemoglobin 18.5 (*)    HCT 52.3 (*)  Neutro Abs 18.1 (*)    Lymphs Abs 5.1 (*)    Monocytes Absolute 1.3 (*)    Abs Immature Granulocytes 0.15 (*)    All other components within normal limits  LIPASE, BLOOD - Abnormal; Notable for the following components:   Lipase 92 (*)    All other components within normal limits  RESP PANEL BY RT-PCR (RSV, FLU A&B, COVID)  RVPGX2  ETHANOL  URINE DRUG SCREEN, QUALITATIVE ([facility] ONLY)  URINALYSIS, ROUTINE W REFLEX MICROSCOPIC  C-REACTIVE PROTEIN  SEDIMENTATION RATE  TROPONIN I (HIGH SENSITIVITY)  TROPONIN I (HIGH SENSITIVITY)     EKG  ED ECG REPORT I, Arta Silence, the attending physician, personally viewed and interpreted this ECG.  Date: 07/23/2022 EKG Time: 0127 Rate: 59 Rhythm: normal sinus rhythm with PACs QRS Axis: normal Intervals: normal ST/T Wave abnormalities: Nonspecific ST abnormalities Narrative Interpretation: no evidence of acute ischemia   ED ECG REPORT I, Arta Silence, the attending physician, personally viewed and interpreted this ECG.  Date: 07/23/2022 EKG Time: 0157 Rate:  68 Rhythm: normal sinus rhythm QRS Axis: normal Intervals: normal ST/T Wave abnormalities: Nonspecific ST abnormalities Narrative Interpretation: no evidence of acute ischemia; no dynamic change when compared to EKG of 01 27 today     RADIOLOGY  Chest x-ray: I independently viewed and interpreted the images; there is no focal consolidation or edema  CT angio chest:  IMPRESSION:  1. No evidence of pulmonary embolism or other acute intrathoracic  process.  2. Marked severity coronary artery calcification.  3. Bilateral fat containing inguinal hernias.  4. Aortic atherosclerosis without evidence of aneurysmal dilatation,  dissection, vasculitis or significant stenosis.     PROCEDURES:  Critical Care performed: Yes, see critical care procedure note(s)  .Critical Care  Performed by: Arta Silence, MD Authorized by: Arta Silence, MD   Critical care provider statement:    Critical care time (minutes):  30   Critical care time was exclusive of:  Separately billable procedures and treating other patients   Critical care was necessary to treat or prevent imminent or life-threatening deterioration of the following conditions:  Cardiac failure   Critical care was time spent personally by me on the following activities:  Development of treatment plan with patient or surrogate, discussions with consultants, evaluation of patient's response to treatment, examination of patient, ordering and review of laboratory studies, ordering and review of radiographic studies, ordering and performing treatments and interventions, pulse oximetry, re-evaluation of patient's condition, review of old charts and obtaining history from patient or surrogate    MEDICATIONS ORDERED IN ED: Medications  potassium chloride 10 mEq in 100 mL IVPB (10 mEq Intravenous New Bag/Given 07/23/22 0349)  potassium chloride 10 mEq in 100 mL IVPB (10 mEq Intravenous New Bag/Given 07/23/22 0344)  sodium chloride  0.9 % bolus 1,000 mL (1,000 mLs Intravenous New Bag/Given 07/23/22 0148)  fentaNYL (SUBLIMAZE) injection 50 mcg (50 mcg Intravenous Given 07/23/22 0149)  iohexol (OMNIPAQUE) 350 MG/ML injection 100 mL (100 mLs Intravenous Contrast Given 07/23/22 0226)  fentaNYL (SUBLIMAZE) injection 50 mcg (50 mcg Intravenous Given 07/23/22 0414)     IMPRESSION / MDM / [place] / ED COURSE  I reviewed the triage vital signs and the nursing notes.  52 year old male with PMH as noted above presents with acute onset of diffuse itching and redness followed by chest pain.  The patient was given Benadryl and aspirin and the itching has improved.  On exam his vital signs are normal.  He is uncomfortable  appearing and slightly tired or somnolent but oriented x 3.  After few minutes in the ED, the diffuse redness of his skin was starting to fade.  He has no hives or focal rash.  Exam is otherwise negative for focal findings.  He cannot recall any specific allergic exposure.  Differential diagnosis for the rash includes, but is not limited to, anaphylaxis, acute allergic reaction, viral exanthem.  Differential for the chest pain includes, but is not limited to, GERD, musculoskeletal pain, radiculopathy or other nerve pain, ACS, pancreatitis.  I have a lower suspicion for dissection or other vascular cause.  The pain is not radiating to the back.  There is no clinical evidence for DVT or PE.  Initial EKG was read by the machine at STEMI due to minimal ST elevations not meeting criteria as well as questionable depressions in aVL.  Repeat done immediately afterwards did not show any evidence of acute MI.  There is no indication for STEMI activation.  We will give analgesia, obtain lab workup including cardiac enzymes, LFTs and lipase, chest x-ray, and reassess.  Patient's presentation is most consistent with acute presentation with potential threat to life or bodily function.  The patient is on the cardiac monitor to  evaluate for evidence of arrhythmia and/or significant heart rate changes. Clinical Course as of 07/23/22 0437  Tue Jul 23, 2022  0436 [facility] Chest [place] 1 View [SS]    Clinical Course User Index [SS] Arta Silence, MD   ----------------------------------------- 2:52 AM on 07/23/2022 -----------------------------------------  The patient had a low blood pressure reading in the 33A systolic and became somewhat bradycardic at that time.  He was having active pain at that time.  However repeat blood pressure done after a few minutes was normal and the heart rate went back to the 90s.  It appears that the patient may have had a vasovagal episode.  Repeat EKG shows no dynamic changes or evidence of MI.  Initial troponin is negative.  Lab workup is significant for elevated lipase, hypokalemia, and elevated WBC count.  The patient is receiving fluids.  Given the pain somewhat out of proportion to exam and the episode of hypotension I have a ordered a CT angio to rule out dissection.  ----------------------------------------- 3:57 AM on 07/23/2022 -----------------------------------------  CT angio shows no concerning acute findings.  The patient continues to have pain.  He will need admission for further workup and treatment for the hypokalemia and possible pancreatitis.  I consulted Dr. Damita Dunnings from the hospitalist service; based on our discussion she agrees to admit the patient.   FINAL CLINICAL IMPRESSION(S) / ED DIAGNOSES   Final diagnoses:  Chest pain, unspecified type  Hypokalemia  Acute pancreatitis without infection or necrosis, unspecified pancreatitis type     Rx / DC Orders   ED Discharge Orders     None        Note:  This document was prepared using Dragon voice recognition software and may include unintentional dictation errors.    Arta Silence, MD 07/23/22 [phone]

## 2022-07-23 NOTE — Assessment & Plan Note (Signed)
Patient with chest pain but otherwise no stigmata of acute infection Follow-up UA, respiratory viral panel Follow sed rate

## 2022-07-23 NOTE — Assessment & Plan Note (Signed)
EtOH less than 10.  CIWA withdrawal protocol

## 2022-07-23 NOTE — Progress Notes (Signed)
[place] for heparin infusion Indication: ACS/STEMI  No Known Allergies  Patient Measurements: Height: 6\' 2"  (188 cm) Weight: 93 kg (205 lb) IBW/kg (Calculated) : 82.2 Heparin Dosing Weight: 93 kg  Vital Signs: Temp: 97.7 F (36.5 C) (01/23 0201) Temp Source: Oral (01/23 0201) BP: 119/78 (01/23 0514) Pulse Rate: 70 (01/23 0514)  Labs: Recent Labs    07/23/22 0129 07/23/22 0333  HGB 18.5*  --   HCT 52.3*  --   PLT 298  --   CREATININE 1.05  --   TROPONINIHS 15 83*    Estimated Creatinine Clearance: 97.9 mL/min (by C-G formula based on SCr of 1.05 mg/dL).   Medical History: Past Medical History:  Diagnosis Date   Acute pancreatitis 07/2015   hospitalization at [facility]   Alcohol abuse    Chronic back pain    Family history of diabetes mellitus    sister   Family history of heart attack    Father.    Family history of stomach cancer    Gout    Hypertension    Hypertriglyceridemia    Type 2 diabetes mellitus ([place]) 07/2015    Assessment: Pt is a 52 yo male presenting to ED c/o L-sided CP, found with slightly elevated Troponin I, trending up.   Goal of Therapy:  Heparin level 0.3-0.7 units/ml Monitor platelets by anticoagulation protocol: Yes   Plan:  Bolus 4000 units x 1 Start heparin infusion at 1200 units/hr Will check HL in 6 hr after start of infusion CBC daily while on heparin  Renda Rolls, PharmD, [facility] 07/23/2022 5:29 AM

## 2022-07-23 NOTE — ED Triage Notes (Signed)
Arrived via EMS. Patient reports generalized itching then starting having 10/10 chest pain. Patient states he also feels short of breath. 18G left AC. 50 mg Benadryl IV. 324 Aspirin. Patient drowsy, oriented x4. Resp even, unlabored on RA. Grimacing in pain. No facial swelling noted.

## 2022-07-23 NOTE — Assessment & Plan Note (Signed)
Systolic in the 51Z on arrival, fluid responsive to the 90s CTA chest abdomen and pelvis with no acute findings Continue IV hydration to keep MAP over 65 Hold home amlodipine, carvedilol

## 2022-07-23 NOTE — H&P (Addendum)
History and Physical    Patient: Donald Hart ZOX:[id number] DOB: 22-Jan-1971 DOA: 07/23/2022 DOS: the patient was seen and examined on 07/23/2022 PCP: [facility]  Patient coming from: Home  Chief Complaint:  Chief Complaint  Patient presents with   Allergic Reaction   Chest Pain    HPI: Donald Hart is a 52 y.o. male with medical history significant for HTN, alcohol use disorder, nicotine dependence, insulin-dependent type 2 diabetes, family history of premature CAD in father, hypertriglyceridemia with associated pancreatitis, who presents to the ED with left-sided chest pain described as a tightness radiating to the left upper extremity, unrelenting, associated with nausea and shortness of breath.  Onset of pain was while at rest.  Denies diaphoresis, lightheadedness or palpitations.  Denies cough, fever or chills, lower extremity pain or swelling.  Was previously in his usual state of health.  His pain is 10 out of 10 when evaluated.  States he had an episode about 2 months prior that lasted 20 minutes and resolved on his own and he did not seek medical attention. ED course and data review: Hypotensive on arrival with BP 63/31 improving to 94/60 1:09 liter fluid bolus and with otherwise normal vitals.  First troponin 15.  Labs otherwise significant for WBC of 25,000, hemoglobin 18, potassium 2.7, glucose 252, AST 49 and total bilirubin 1.8.  Lipase was 92.EKG, personally viewed and interpreted showed sinus at 69 with nonspecific ST-T wave changes patient had a CT angio chest abdomen and pelvis that showed no evidence of PE or other acute intrathoracic process but did show marked severity coronary artery calcification as follows: IMPRESSION: 1. No evidence of pulmonary embolism or other acute intrathoracic process. 2. Marked severity coronary artery calcification. 3. Bilateral fat containing inguinal hernias. 4. Aortic atherosclerosis without  evidence of aneurysmal dilatation, dissection, vasculitis or significant stenosis.  Patient treated with fentanyl for pain given IV potassium repletion and hospitalist consulted for admission..   Review of Systems: As mentioned in the history of present illness. All other systems reviewed and are negative.  Past Medical History:  Diagnosis Date   Acute pancreatitis 07/2015   hospitalization at [facility]   Alcohol abuse    Chronic back pain    Family history of diabetes mellitus    sister   Family history of heart attack    Father.    Family history of stomach cancer    Gout    Hypertension    Hypertriglyceridemia    Type 2 diabetes mellitus (HCC) 07/2015   Past Surgical History:  Procedure Laterality Date   NO PAST SURGERIES     Social History:  reports that he has been smoking cigarettes. He has a 20.00 pack-year smoking history. He has never used smokeless tobacco. He reports that he does not currently use alcohol after a past usage of about 12.0 standard drinks of alcohol per week. He reports that he does not use drugs.  No Known Allergies  Family History  Adopted: Yes  Problem Relation Age of Onset   Heart attack Father    Stomach cancer Father    Diabetes Father    Diabetes Sister    Diabetes Sister     Prior to Admission medications   Medication Sig Start Date End Date Taking? Authorizing Provider  albuterol (PROVENTIL HFA;VENTOLIN HFA) 108 (90 Base) MCG/ACT inhaler Inhale 1-2 puffs every 6 (six) hours as needed into the lungs for wheezing or shortness of breath. 05/05/17   Payton Mccallum, MD  allopurinol (ZYLOPRIM) 100 MG tablet Take 2 tablets by mouth daily. 05/17/15   [provider]  amitriptyline (ELAVIL) 75 MG tablet Take 1 tablet (75 mg total) by mouth at bedtime. 09/30/16   Gouru, Illene Silver, MD  amLODipine (NORVASC) 10 MG tablet Take 1 tablet by mouth daily. 05/17/15   [provider]  atorvastatin (LIPITOR) 40 MG tablet Take 40 mg by mouth daily.  Reported on 07/21/2015 07/12/15 09/20/16  [provider]  azithromycin (ZITHROMAX Z-PAK) 250 MG tablet 2 tabs po once day 1, then 1 tab po qd for next 4 days 05/05/17   Norval Gable, MD  benzonatate (TESSALON) 200 MG capsule Take 1 capsule (200 mg total) 3 (three) times daily as needed by mouth. 05/05/17   Norval Gable, MD  carvedilol (COREG) 6.25 MG tablet Take 1 tablet by mouth 2 (two) times daily. 05/22/15   [provider]  colchicine 0.6 MG tablet Take 0.6 mg by mouth daily as needed.    [provider]  docusate sodium (COLACE) 100 MG capsule Take 1 capsule (100 mg total) by mouth 2 (two) times daily as needed for mild constipation. 09/30/16   Nicholes Mango, MD  fenofibrate 160 MG tablet Take 1 tablet (160 mg total) by mouth daily. 10/01/16   Nicholes Mango, MD  folic acid (FOLVITE) 1 MG tablet Take 1 mg by mouth daily.    [provider]  gabapentin (NEURONTIN) 300 MG capsule Take 300 mg by mouth 3 (three) times daily.    [provider]  guaiFENesin-dextromethorphan (ROBITUSSIN DM) 100-10 MG/5ML syrup Take 10 mLs by mouth every 6 (six) hours as needed for cough. Patient not taking: Reported on 09/04/2016 07/15/16   Nicholes Mango, MD  hyoscyamine (LEVSIN, ANASPAZ) 0.125 MG tablet Take 1 tablet (0.125 mg total) by mouth every 6 (six) hours as needed for cramping. 09/30/16   Gouru, Illene Silver, MD  insulin NPH Human (HUMULIN N,NOVOLIN N) 100 UNIT/ML injection Inject 0.1 mLs (10 Units total) into the skin at bedtime. Reported on 07/21/2015 09/30/16 12/05/17  Nicholes Mango, MD  insulin regular (NOVOLIN R,HUMULIN R) 250 units/2.20mL (100 units/mL) injection Inject 0.07 mLs (7 Units total) into the skin 3 (three) times daily before meals. 09/30/16   Nicholes Mango, MD  losartan (COZAAR) 100 MG tablet Take 100 mg by mouth daily.    [provider]  magnesium oxide (MAG-OX) 400 MG tablet Take 400 mg by mouth daily.    [provider]  Melatonin 3 MG TABS Take 6 mg by  mouth at bedtime.    [provider]  meloxicam (MOBIC) 15 MG tablet Take 1 tablet (15 mg total) by mouth daily. 09/11/21   Triplett, Cari B, FNP  menthol-cetylpyridinium (CEPACOL) 3 MG lozenge Take 1 lozenge (3 mg total) by mouth as needed for sore throat. Patient not taking: Reported on 09/20/2016 07/15/16   Nicholes Mango, MD  Multiple Vitamin (MULTIVITAMIN WITH MINERALS) TABS tablet Take 1 tablet by mouth daily. 10/01/16   Nicholes Mango, MD  niacin 100 MG tablet Take 1 tablet (100 mg total) by mouth at bedtime. 09/30/16   Gouru, Illene Silver, MD  nicotine (NICODERM CQ - DOSED IN MG/24 HOURS) 21 mg/24hr patch Place 1 patch (21 mg total) onto the skin daily. 10/01/16   Gouru, Illene Silver, MD  omega-3 acid ethyl esters (LOVAZA) 1 g capsule Take 2 g by mouth 2 (two) times daily.    [provider]  pantoprazole (PROTONIX) 40 MG tablet Take 1 tablet (40 mg total)  by mouth daily. 04/04/17 04/04/18  Harvest Dark, MD  pantoprazole (PROTONIX) 40 MG tablet Take 1 tablet (40 mg total) by mouth daily. 01/30/18 01/30/19  Gregor Hams, MD  thiamine 100 MG tablet Take 1 tablet (100 mg total) by mouth daily. 10/01/16   Nicholes Mango, MD    Physical Exam: Vitals:   07/23/22 0200 07/23/22 0201 07/23/22 0300 07/23/22 0330  BP: 115/75  102/69 94/69  Pulse: 92  78 60  Resp: 17  (!) 7 11  Temp:  97.7 F (36.5 C)    TempSrc:  Oral    SpO2: 98%  93% 94%  Weight:      Height:       Physical Exam Vitals and nursing note reviewed.  Constitutional:      General: He is not in acute distress.    Comments: Patient in discomfort from pain, clutching her chest  HENT:     Head: Normocephalic and atraumatic.  Cardiovascular:     Rate and Rhythm: Normal rate and regular rhythm.     Heart sounds: Normal heart sounds.  Pulmonary:     Effort: Pulmonary effort is normal.     Breath sounds: Normal breath sounds.  Abdominal:     Palpations: Abdomen is soft.     Tenderness: There is no abdominal tenderness.   Neurological:     Mental Status: Mental status is at baseline.     Labs on Admission: I have personally reviewed following labs and imaging studies  CBC: Recent Labs  Lab 07/23/22 0129  WBC 25.2*  NEUTROABS 18.1*  HGB 18.5*  HCT 52.3*  MCV 84.5  PLT [id number]   Basic Metabolic Panel: Recent Labs  Lab 07/23/22 0129  NA 137  K 2.7*  CL 100  CO2 21*  GLUCOSE 252*  BUN 13  CREATININE 1.05  CALCIUM 8.5*   GFR: Estimated Creatinine Clearance: 97.9 mL/min (by C-G formula based on SCr of 1.05 mg/dL). Liver Function Tests: Recent Labs  Lab 07/23/22 0129  AST 49*  ALT 30  ALKPHOS 71  BILITOT 1.8*  PROT 6.8  ALBUMIN 3.9   Recent Labs  Lab 07/23/22 0129  LIPASE 92*   No results for input(s): "AMMONIA" in the last 168 hours. Coagulation Profile: No results for input(s): "INR", "PROTIME" in the last 168 hours. Cardiac Enzymes: No results for input(s): "CKTOTAL", "CKMB", "CKMBINDEX", "TROPONINI" in the last 168 hours. BNP (last 3 results) No results for input(s): "PROBNP" in the last 8760 hours. HbA1C: No results for input(s): "HGBA1C" in the last 72 hours. CBG: No results for input(s): "GLUCAP" in the last 168 hours. Lipid Profile: No results for input(s): "CHOL", "HDL", "LDLCALC", "TRIG", "CHOLHDL", "LDLDIRECT" in the last 72 hours. Thyroid Function Tests: No results for input(s): "TSH", "T4TOTAL", "FREET4", "T3FREE", "THYROIDAB" in the last 72 hours. Anemia Panel: No results for input(s): "VITAMINB12", "FOLATE", "FERRITIN", "TIBC", "IRON", "RETICCTPCT" in the last 72 hours. Urine analysis:    Component Value Date/Time   COLORURINE YELLOW (A) 09/20/2016 2004   APPEARANCEUR CLEAR (A) 09/20/2016 2004   APPEARANCEUR Clear 09/22/2013 1102   LABSPEC 1.018 09/20/2016 2004   LABSPEC 1.006 09/22/2013 1102   PHURINE 7.0 09/20/2016 2004   GLUCOSEU >=500 (A) 09/20/2016 2004   GLUCOSEU Negative 09/22/2013 1102   HGBUR SMALL (A) 09/20/2016 2004   BILIRUBINUR NEGATIVE  09/20/2016 2004   BILIRUBINUR Negative 09/22/2013 1102   KETONESUR 20 (A) 09/20/2016 2004   PROTEINUR 100 (A) 09/20/2016 2004   NITRITE NEGATIVE 09/20/2016 2004  LEUKOCYTESUR NEGATIVE 09/20/2016 2004   LEUKOCYTESUR Negative 09/22/2013 1102    Radiological Exams on Admission: CT Angio Chest/Abd/Pel for Dissection W and/or Wo Contrast  Result Date: 07/23/2022 CLINICAL DATA:  Chest pain and shortness of breath. EXAM: CT ANGIOGRAPHY CHEST, ABDOMEN AND PELVIS TECHNIQUE: Non-contrast CT of the chest was initially obtained. Multidetector CT imaging through the chest, abdomen and pelvis was performed using the standard protocol during bolus administration of intravenous contrast. Multiplanar reconstructed images and MIPs were obtained and reviewed to evaluate the vascular anatomy. RADIATION DOSE REDUCTION: This exam was performed according to the departmental dose-optimization program which includes automated exposure control, adjustment of the mA and/or kV according to patient size and/or use of iterative reconstruction technique. CONTRAST:  175mL OMNIPAQUE IOHEXOL 350 MG/ML SOLN COMPARISON:  January 30, 2018 FINDINGS: CTA CHEST FINDINGS Cardiovascular: There is mild calcification of the aortic arch and descending thoracic aorta, without evidence of thoracic aortic aneurysm or dissection. Satisfactory opacification of the pulmonary arteries to the segmental level. No evidence of pulmonary embolism. Normal heart size with marked severity coronary artery calcification. No pericardial effusion. Mediastinum/Nodes: No enlarged mediastinal, hilar, or axillary lymph nodes. Thyroid gland, trachea, and esophagus demonstrate no significant findings. Lungs/Pleura: Lungs are clear. No pleural effusion or pneumothorax. Musculoskeletal: Multilevel degenerative changes are seen throughout the thoracic spine. Review of the MIP images confirms the above findings. CTA ABDOMEN AND PELVIS FINDINGS VASCULAR Aorta: Moderate severity  calcification and atherosclerosis of a normal caliber aorta without aneurysm, dissection, vasculitis or significant stenosis. Celiac: Patent without evidence of aneurysm, dissection, vasculitis or significant stenosis. SMA: Patent without evidence of aneurysm, dissection, vasculitis or significant stenosis. Renals: Both renal arteries are patent without evidence of aneurysm, dissection, vasculitis, fibromuscular dysplasia or significant stenosis. IMA: Patent without evidence of aneurysm, dissection, vasculitis or significant stenosis. Inflow: Moderate to marked severity calcification and atherosclerosis, without evidence of aneurysm, dissection, vasculitis or significant stenosis. Veins: No obvious venous abnormality within the limitations of this arterial phase study. Review of the MIP images confirms the above findings. NON-VASCULAR Hepatobiliary: No focal liver abnormality is seen. No gallstones, gallbladder wall thickening, or biliary dilatation. Pancreas: Unremarkable. No pancreatic ductal dilatation or surrounding inflammatory changes. Spleen: Normal in size without focal abnormality. Adrenals/Urinary Tract: Adrenal glands are unremarkable. Kidneys are normal in size, without renal calculi or hydronephrosis. A 2.0 cm x 1.4 cm simple cyst is seen within the lateral aspect of the mid right kidney. Bladder is unremarkable. Stomach/Bowel: Stomach is within normal limits. Appendix appears normal. No evidence of bowel wall thickening, distention, or inflammatory changes. Lymphatic: No abnormal abdominal or pelvic lymph nodes are identified. Reproductive: Prostate is unremarkable. Other: A 3.0 cm x 2.4 cm fat containing right inguinal hernia is seen. An additional 2.5 cm x 2.1 cm fat containing left inguinal hernia is also noted. No abdominopelvic ascites. Musculoskeletal: Multilevel degenerative changes are noted within the lumbar spine. Review of the MIP images confirms the above findings. IMPRESSION: 1. No  evidence of pulmonary embolism or other acute intrathoracic process. 2. Marked severity coronary artery calcification. 3. Bilateral fat containing inguinal hernias. 4. Aortic atherosclerosis without evidence of aneurysmal dilatation, dissection, vasculitis or significant stenosis. Aortic Atherosclerosis (ICD10-I70.0). Electronically Signed   By: Virgina Norfolk M.D.   On: 07/23/2022 03:04   DG Chest Port 1 View  Result Date: 07/23/2022 CLINICAL DATA:  Severe chest pain and shortness of breath. EXAM: PORTABLE CHEST 1 VIEW COMPARISON:  September 20, 2016 FINDINGS: The heart size and mediastinal contours are within normal  limits. Low lung volumes are noted. There is no evidence of an acute infiltrate, pleural effusion or pneumothorax. Multilevel degenerative changes are seen throughout the thoracic spine. IMPRESSION: No active disease. Electronically Signed   By: Virgina Norfolk M.D.   On: 07/23/2022 02:50     Data Reviewed: Relevant notes from primary care and specialist visits, past discharge summaries as available in EHR, including Care Everywhere. Prior diagnostic testing as pertinent to current admission diagnoses Updated medications and problem lists for reconciliation ED course, including vitals, labs, imaging, treatment and response to treatment Triage notes, nursing and pharmacy notes and ED provider's notes Notable results as noted in HPI   Assessment and Plan: * Left-sided chest pain Family history of premature CAD First troponin of 15 and EKG nonacute CT chest shows marked severity coronary artery calcification with no acute findings Follow sed rate/CRP Chewable aspirin, nitroglycerin, morphine for pain Cardiology consult in view of risk factors Continue to trend troponin--> 15>83  heparin infusion started for high risk chest pain  Hypokalemia IV repletion started in the ED for potassium 2.4 Continue to monitor and replete as necessary  Hypotension Systolic in the 50P on  arrival, fluid responsive to the 90s CTA chest abdomen and pelvis with no acute findings Continue IV hydration to keep MAP over 65 Hold home amlodipine, carvedilol  Elevated lipase History of pancreatitis secondary to hypertriglyceridemia Check triglyceride level CT abdomen with no acute findings  Leukocytosis Patient with chest pain but otherwise no stigmata of acute infection Follow-up UA, respiratory viral panel Follow sed rate  Uncontrolled type 2 diabetes mellitus with hyperglycemia, with long-term current use of insulin (HCC) Blood sugar 252 Sliding scale coverage  Hypertriglyceridemia History of severe hypertriglyceridemia in the thousands Will check triglyceride level  Tobacco use disorder Nicotine patch  Alcohol use disorder EtOH less than 10.  CIWA withdrawal protocol  History of substance use disorder Monitor for signs of withdrawal        DVT prophylaxis: Lovenox  Consults: Cardiology [facility], Dr. Caryl Comes  Advance Care Planning:   Code Status: Prior   Family Communication: Mother at bedside  Disposition Plan: Back to previous home environment  Severity of Illness: The appropriate patient status for this patient is OBSERVATION. Observation status is judged to be reasonable and necessary in order to provide the required intensity of service to ensure the patient's safety. The patient's presenting symptoms, physical exam findings, and initial radiographic and laboratory data in the context of their medical condition is felt to place them at decreased risk for further clinical deterioration. Furthermore, it is anticipated that the patient will be medically stable for discharge from the hospital within 2 midnights of admission.   Author: Athena Masse, MD 07/23/2022 4:31 AM  For on call review [email or web address].

## 2022-07-23 NOTE — Progress Notes (Signed)
ED RN called to make RN aware of critical troponin. Cathy RN called Dr. Fletcher Anon to make him aware of critical troponin of 2777. No new orders.

## 2022-07-23 NOTE — Assessment & Plan Note (Signed)
History of pancreatitis secondary to hypertriglyceridemia Check triglyceride level CT abdomen with no acute findings

## 2022-07-23 NOTE — TOC Benefit Eligibility Note (Signed)
Patient Teacher, English as a foreign language completed.    The patient is currently admitted and upon discharge could be taking Brilinta 90 mg.  The current 30 day co-pay is $4.00.   The patient is insured through [employer], [place] Patient Advocate Specialist [place] Patient Advocate Team Direct Number: [phone]  Fax: [phone]

## 2022-07-24 ENCOUNTER — Encounter: Payer: Self-pay | Admitting: Cardiovascular Disease

## 2022-07-24 DIAGNOSIS — I10 Essential (primary) hypertension: Secondary | ICD-10-CM

## 2022-07-24 DIAGNOSIS — F17200 Nicotine dependence, unspecified, uncomplicated: Secondary | ICD-10-CM

## 2022-07-24 DIAGNOSIS — I214 Non-ST elevation (NSTEMI) myocardial infarction: Secondary | ICD-10-CM | POA: Diagnosis not present

## 2022-07-24 DIAGNOSIS — R079 Chest pain, unspecified: Secondary | ICD-10-CM | POA: Diagnosis not present

## 2022-07-24 LAB — ECHOCARDIOGRAM COMPLETE
Area-P 1/2: 4.27 cm2
Height: 74 in
S' Lateral: 3 cm
Weight: 3280 oz

## 2022-07-24 LAB — GLUCOSE, CAPILLARY
Glucose-Capillary: 114 mg/dL — ABNORMAL HIGH (ref 70–99)
Glucose-Capillary: 126 mg/dL — ABNORMAL HIGH (ref 70–99)
Glucose-Capillary: 143 mg/dL — ABNORMAL HIGH (ref 70–99)

## 2022-07-24 LAB — LIPOPROTEIN A (LPA): Lipoprotein (a): 8.4 nmol/L (ref <75.0)

## 2022-07-24 MED ORDER — ATORVASTATIN CALCIUM 40 MG PO TABS: 40.0000 mg | ORAL_TABLET | Freq: Every day | ORAL | 0 refills | Status: DC

## 2022-07-24 MED ORDER — NICOTINE 21 MG/24HR TD PT24: 21.0000 mg | MEDICATED_PATCH | Freq: Every day | TRANSDERMAL | 0 refills | Status: DC

## 2022-07-24 MED ORDER — LOSARTAN POTASSIUM 100 MG PO TABS: 100.0000 mg | ORAL_TABLET | Freq: Every day | ORAL | 0 refills | Status: DC

## 2022-07-24 MED ORDER — LOSARTAN POTASSIUM 50 MG PO TABS
50.0000 mg | ORAL_TABLET | Freq: Every day | ORAL | Status: DC
  Administered 2022-07-24: 50 mg via ORAL
  Filled 2022-07-24: qty 1

## 2022-07-24 MED ORDER — ASPIRIN 81 MG PO TBEC: 81.0000 mg | DELAYED_RELEASE_TABLET | Freq: Every day | ORAL | 12 refills | Status: AC

## 2022-07-24 MED ORDER — LOSARTAN POTASSIUM 50 MG PO TABS
50.0000 mg | ORAL_TABLET | Freq: Once | ORAL | Status: AC
  Administered 2022-07-24: 50 mg via ORAL
  Filled 2022-07-24: qty 1

## 2022-07-24 MED ORDER — TICAGRELOR 90 MG PO TABS: 90.0000 mg | ORAL_TABLET | Freq: Two times a day (BID) | ORAL | 0 refills | Status: DC

## 2022-07-24 MED ORDER — CARVEDILOL 12.5 MG PO TABS: 12.5000 mg | ORAL_TABLET | Freq: Two times a day (BID) | ORAL | Status: DC

## 2022-07-24 MED ORDER — CARVEDILOL 12.5 MG PO TABS: 12.5000 mg | ORAL_TABLET | Freq: Two times a day (BID) | ORAL | 0 refills | Status: DC

## 2022-07-24 MED ORDER — CARVEDILOL 6.25 MG PO TABS
6.2500 mg | ORAL_TABLET | Freq: Once | ORAL | Status: AC
  Administered 2022-07-24: 6.25 mg via ORAL
  Filled 2022-07-24: qty 1

## 2022-07-24 MED ORDER — LOSARTAN POTASSIUM 50 MG PO TABS: 100.0000 mg | ORAL_TABLET | Freq: Every day | ORAL | Status: DC

## 2022-07-24 NOTE — TOC Transition Note (Signed)
Transition of Care [facility]) - CM/SW Discharge Note   Patient Details  Name: Nirav Sweda MRN: [id number] Date of Birth: 1971-04-18  [facility] of [facility] CM/SW Contact:  Laurena Slimmer, RN Phone Number: 07/24/2022, 1:16 PM   Clinical Narrative:    Spoke with patient regarding resources. Patient declined to receive resources for SA abuse and transportation.          Patient Goals and CMS Choice      Discharge Placement                         Discharge Plan and Services Additional resources added to the After Visit Summary for                                       Social Determinants of Health (SDOH) Interventions SDOH Screenings   Food Insecurity: No Food Insecurity (07/23/2022)  Housing: Low Risk  (07/23/2022)  Transportation Needs: Unmet Transportation Needs (07/23/2022)  Utilities: Not At Risk (07/23/2022)  Tobacco Use: High Risk (07/24/2022)     Readmission Risk Interventions     No data to display

## 2022-07-24 NOTE — Discharge Summary (Signed)
Physician Discharge Summary  Donald Hart ZOX:[id number] DOB: 11/07/1970 DOA: 07/23/2022  [facility]  Admit date: 07/23/2022 Discharge date: 07/24/2022  Admitted From: Home Disposition:  Home  Recommendations for Outpatient Follow-up:  Follow up with PCP in 1-2 weeks Follow-up cardiology/heart failure service 1 to 2 weeks  Home Health: No Equipment/Devices: None  Discharge Condition: Stable CODE STATUS: Full Diet recommendation: Heart healthy  Brief/Interim Summary: 52 y.o. male with medical history significant for HTN, alcohol use disorder, nicotine dependence, insulin-dependent type 2 diabetes, family history of premature CAD in father, hypertriglyceridemia with associated pancreatitis, who presents to the ED with left-sided chest pain described as a tightness radiating to the left upper extremity, unrelenting, associated with nausea and shortness of breath.  Onset of pain was while at rest.  Denies diaphoresis, lightheadedness or palpitations.  Denies cough, fever or chills, lower extremity pain or swelling.  Was previously in his usual state of health.  His pain is 10 out of 10 when evaluated.  States he had an episode about 2 months prior that lasted 20 minutes and resolved on his own and he did not seek medical attention.   Patient was hypotensive on arrival.  Improved after fluid bolus.  Cardiology consulted.  Underwent cardiac catheterization on 1/23.  One-vessel disease.  Treated with PCI and DES.  Stable postprocedure.  No chest pain.  Will recommend discharge with dual antiplatelet therapy.  Continue beta-blockade and ACE inhibitor.  Follow-up in cardiology clinic 1 to 2 weeks   Discharge Diagnoses:  Principal Problem:   Left-sided chest pain Active Problems:   Family history of premature CAD   Hypotension   Hypokalemia   Leukocytosis   Elevated lipase   Hypertriglyceridemia   Uncontrolled type 2 diabetes mellitus with hyperglycemia,  with long-term current use of insulin (HCC)   History of substance use disorder   Alcohol use disorder   Tobacco use disorder   Non-ST elevation (NSTEMI) myocardial infarction [facility](HCC)   Primary hypertension  NSTEMI Patient underwent cardiac catheterization on 1/23.  One-vessel disease.  Treated with PCI and DES to affected lesion.  Stable postprocedure.  Chest pain-free.  Appropriate for discharge.  Will recommend dual antiplatelet therapy aspirin and Brilinta.  Follow-up in cardiology office 1 week.  Discharge Instructions  Discharge Instructions     AMB Referral to Cardiac Rehabilitation - Phase II   Complete by: As directed    Diagnosis:  NSTEMI Coronary Stents     After initial evaluation and assessments completed: Virtual Based Care may be provided alone or in conjunction with Phase 2 Cardiac Rehab based on patient barriers.: Yes   Intensive Cardiac Rehabilitation (ICR) [facility] location only OR Traditional Cardiac Rehabilitation (TCR) *If criteria for ICR are not met will enroll in TCR [facility] only): Yes   Ambulatory Referral for Lung Cancer Scre   Complete by: As directed    Diet - low sodium heart healthy   Complete by: As directed    Increase activity slowly   Complete by: As directed       Allergies as of 07/24/2022   No Known Allergies      Medication List     STOP taking these medications    amLODipine 10 MG tablet Commonly known as: NORVASC   azithromycin 250 MG tablet Commonly known as: Zithromax Z-Pak   benzonatate 200 MG capsule Commonly known as: TESSALON   guaiFENesin-dextromethorphan 100-10 MG/5ML syrup Commonly known as: ROBITUSSIN DM   hydrochlorothiazide 25 MG tablet Commonly known  as: HYDRODIURIL   hydrochlorothiazide 50 MG tablet Commonly known as: HYDRODIURIL   losartan-hydrochlorothiazide 100-25 MG tablet Commonly known as: HYZAAR   meloxicam 15 MG tablet Commonly known as: MOBIC   menthol-cetylpyridinium 3 MG lozenge Commonly known as:  CEPACOL   niacin 100 MG tablet Commonly known as: VITAMIN B3   pantoprazole 40 MG tablet Commonly known as: Protonix       TAKE these medications    albuterol 108 (90 Base) MCG/ACT inhaler Commonly known as: VENTOLIN HFA Inhale 1-2 puffs every 6 (six) hours as needed into the lungs for wheezing or shortness of breath.   aspirin EC 81 MG tablet Take 1 tablet (81 mg total) by mouth daily. Swallow whole. Start taking on: July 25, 2022   atorvastatin 80 MG tablet Commonly known as: LIPITOR Take 80 mg by mouth daily. What changed: Another medication with the same name was removed. Continue taking this medication, and follow the directions you see here.   carvedilol 12.5 MG tablet Commonly known as: COREG Take 1 tablet (12.5 mg total) by mouth 2 (two) times daily. What changed:  medication strength how much to take   colchicine 0.6 MG tablet Take 0.6 mg by mouth daily as needed.   docusate sodium 100 MG capsule Commonly known as: COLACE Take 1 capsule (100 mg total) by mouth 2 (two) times daily as needed for mild constipation.   fenofibrate 160 MG tablet Take 1 tablet (160 mg total) by mouth daily.   folic acid 1 MG tablet Commonly known as: FOLVITE Take 1 mg by mouth daily.   gabapentin 800 MG tablet Commonly known as: NEURONTIN Take 800 mg by mouth 3 (three) times daily.   hyoscyamine 0.125 MG tablet Commonly known as: LEVSIN Take 1 tablet (0.125 mg total) by mouth every 6 (six) hours as needed for cramping.   Jardiance 25 MG Tabs tablet Generic drug: empagliflozin Take 25 mg by mouth daily.   losartan 100 MG tablet Commonly known as: COZAAR Take 1 tablet (100 mg total) by mouth daily.   magnesium oxide 400 MG tablet Commonly known as: MAG-OX Take 400 mg by mouth daily.   multivitamin with minerals Tabs tablet Take 1 tablet by mouth daily.   nicotine 21 mg/24hr patch Commonly known as: NICODERM CQ - dosed in mg/24 hours Place 1 patch (21 mg  total) onto the skin daily.   omega-3 acid ethyl esters 1 g capsule Commonly known as: LOVAZA Take 2 g by mouth 2 (two) times daily.   omeprazole 20 MG capsule Commonly known as: PRILOSEC Take 20 mg by mouth daily.   thiamine 100 MG tablet Commonly known as: VITAMIN B1 Take 1 tablet (100 mg total) by mouth daily.   ticagrelor 90 MG Tabs tablet Commonly known as: BRILINTA Take 1 tablet (90 mg total) by mouth 2 (two) times daily.        Follow-up Information     Creig Hines, NP Follow up on 07/31/2022.   Specialties: Cardiology, Radiology Why: 2:20 PM Contact information: [address] [place] [address] [phone]                No Known Allergies  Consultations: Cardiology-[facility]   Procedures/Studies: ECHOCARDIOGRAM COMPLETE  Result Date: 07/24/2022    ECHOCARDIOGRAM REPORT   Patient Name:   LIRON EISSLER [facility] Date of Exam: 07/23/2022 Medical Rec #:  [id number]              Height:  74.0 in Accession #:    [id number]             Weight:       205.0 lb Date of Birth:  1971/05/26              BSA:          2.196 m Patient Age:    52 years               BP:           159/85 mmHg Patient Gender: M                      HR:           88 bpm. Exam Location:  [facility] Procedure: 2D Echo, Cardiac Doppler and Color Doppler Indications:     R07.9 Chest pain  History:         Patient has no prior history of Echocardiogram examinations.                  Risk Factors:Hypertension, Current Smoker and Diabetes.  Sonographer:     Wilford Sports Rodgers-Jones RDCS Referring Phys:  [id number] GDJMEQAS A ARIDA Diagnosing Phys: Kate Sable MD IMPRESSIONS  1. Left ventricular ejection fraction, by estimation, is 60 to 65%. The left ventricle has normal function. The left ventricle has no regional wall motion abnormalities. Left ventricular diastolic parameters were normal.  2. Right ventricular systolic function is normal. The right ventricular size is normal.   3. The mitral valve is normal in structure. No evidence of mitral valve regurgitation.  4. The aortic valve is tricuspid. Aortic valve regurgitation is not visualized. Aortic valve sclerosis/calcification is present, without any evidence of aortic stenosis. FINDINGS  Left Ventricle: Left ventricular ejection fraction, by estimation, is 60 to 65%. The left ventricle has normal function. The left ventricle has no regional wall motion abnormalities. The left ventricular internal cavity size was normal in size. There is  no left ventricular hypertrophy. Left ventricular diastolic parameters were normal. Right Ventricle: The right ventricular size is normal. No increase in right ventricular wall thickness. Right ventricular systolic function is normal. Left Atrium: Left atrial size was normal in size. Right Atrium: Right atrial size was normal in size. Pericardium: There is no evidence of pericardial effusion. Mitral Valve: The mitral valve is normal in structure. No evidence of mitral valve regurgitation. Tricuspid Valve: The tricuspid valve is normal in structure. Tricuspid valve regurgitation is not demonstrated. Aortic Valve: The aortic valve is tricuspid. Aortic valve regurgitation is not visualized. Aortic valve sclerosis/calcification is present, without any evidence of aortic stenosis. Pulmonic Valve: The pulmonic valve was normal in structure. Pulmonic valve regurgitation is not visualized. Aorta: The aortic root is normal in size and structure. Venous: The inferior vena cava was not well visualized. IAS/Shunts: No atrial level shunt detected by color flow Doppler.  LEFT VENTRICLE PLAX 2D LVIDd:         4.90 cm   Diastology LVIDs:         3.00 cm   LV e' medial:    9.14 cm/s LV PW:         0.60 cm   LV E/e' medial:  11.7 LV IVS:        0.60 cm   LV e' lateral:   14.70 cm/s LVOT diam:     2.10 cm   LV E/e' lateral: 7.2 LV SV:  95 LV SV Index:   43 LVOT Area:     3.46 cm  RIGHT VENTRICLE RV Basal diam:   3.50 cm RV S prime:     15.10 cm/s TAPSE (M-mode): 1.8 cm LEFT ATRIUM             Index        RIGHT ATRIUM           Index LA diam:        4.70 cm 2.14 cm/m   RA Area:     13.40 cm LA Vol (A2C):   51.4 ml 23.40 ml/m  RA Volume:   34.20 ml  15.57 ml/m LA Vol (A4C):   44.8 ml 20.40 ml/m LA Biplane Vol: 50.4 ml 22.95 ml/m  AORTIC VALVE LVOT Vmax:   129.50 cm/s LVOT Vmean:  82.450 cm/s LVOT VTI:    0.276 m  AORTA Ao Root diam: 3.60 cm MITRAL VALVE MV Area (PHT): 4.27 cm     SHUNTS MV Decel Time: 178 msec     Systemic VTI:  0.28 m MV E velocity: 106.50 cm/s  Systemic Diam: 2.10 cm MV A velocity: 89.60 cm/s MV E/A ratio:  1.19 Debbe Odea MD Electronically signed by Debbe Odea MD Signature Date/Time: 07/24/2022/11:43:48 AM    Final    CARDIAC CATHETERIZATION  Result Date: 07/23/2022   Prox RCA lesion is 20% stenosed.   Mid RCA lesion is 20% stenosed.   RPDA lesion is 30% stenosed.   Mid Cx to Dist Cx lesion is 80% stenosed.   Mid LAD lesion is 30% stenosed.   Dist LAD lesion is 50% stenosed.   A drug-eluting stent was successfully placed using a STENT ONYX FRONTIER [id number].   Post intervention, there is a 0% residual stenosis.   The left ventricular systolic function is normal.   LV end diastolic pressure is mildly elevated.   The left ventricular ejection fraction is 55-65% by visual estimate. 1.  Severe one-vessel coronary artery disease involving the mid left circumflex.  Moderately calcified coronary arteries overall. 2.  Normal LV systolic function and mildly elevated left ventricular end-diastolic pressure. 3.  Successful angioplasty and drug-eluting stent placement to the mid left circumflex. Recommendations: Dual antiplatelet therapy for at least 12 months. Aggressive treatment of risk factors. Possible discharge home tomorrow.   CT Angio Chest/Abd/Pel for Dissection W and/or Wo Contrast  Result Date: 07/23/2022 CLINICAL DATA:  Chest pain and shortness of breath. EXAM: CT ANGIOGRAPHY  CHEST, ABDOMEN AND PELVIS TECHNIQUE: Non-contrast CT of the chest was initially obtained. Multidetector CT imaging through the chest, abdomen and pelvis was performed using the standard protocol during bolus administration of intravenous contrast. Multiplanar reconstructed images and MIPs were obtained and reviewed to evaluate the vascular anatomy. RADIATION DOSE REDUCTION: This exam was performed according to the departmental dose-optimization program which includes automated exposure control, adjustment of the mA and/or kV according to patient size and/or use of iterative reconstruction technique. CONTRAST:  OMNIPAQUE IOHEXOL 350 MG/ML SOLN COMPARISON:  January 30, 2018 FINDINGS: CTA CHEST FINDINGS Cardiovascular: There is mild calcification of the aortic arch and descending thoracic aorta, without evidence of thoracic aortic aneurysm or dissection. Satisfactory opacification of the pulmonary arteries to the segmental level. No evidence of pulmonary embolism. Normal heart size with marked severity coronary artery calcification. No pericardial effusion. Mediastinum/Nodes: No enlarged mediastinal, hilar, or axillary lymph nodes. Thyroid gland, trachea, and esophagus demonstrate no significant findings. Lungs/Pleura: Lungs are clear. No pleural effusion or pneumothorax. Musculoskeletal:  Multilevel degenerative changes are seen throughout the thoracic spine. Review of the MIP images confirms the above findings. CTA ABDOMEN AND PELVIS FINDINGS VASCULAR Aorta: Moderate severity calcification and atherosclerosis of a normal caliber aorta without aneurysm, dissection, vasculitis or significant stenosis. Celiac: Patent without evidence of aneurysm, dissection, vasculitis or significant stenosis. SMA: Patent without evidence of aneurysm, dissection, vasculitis or significant stenosis. Renals: Both renal arteries are patent without evidence of aneurysm, dissection, vasculitis, fibromuscular dysplasia or significant  stenosis. IMA: Patent without evidence of aneurysm, dissection, vasculitis or significant stenosis. Inflow: Moderate to marked severity calcification and atherosclerosis, without evidence of aneurysm, dissection, vasculitis or significant stenosis. Veins: No obvious venous abnormality within the limitations of this arterial phase study. Review of the MIP images confirms the above findings. NON-VASCULAR Hepatobiliary: No focal liver abnormality is seen. No gallstones, gallbladder wall thickening, or biliary dilatation. Pancreas: Unremarkable. No pancreatic ductal dilatation or surrounding inflammatory changes. Spleen: Normal in size without focal abnormality. Adrenals/Urinary Tract: Adrenal glands are unremarkable. Kidneys are normal in size, without renal calculi or hydronephrosis. A 2.0 cm x 1.4 cm simple cyst is seen within the lateral aspect of the mid right kidney. Bladder is unremarkable. Stomach/Bowel: Stomach is within normal limits. Appendix appears normal. No evidence of bowel wall thickening, distention, or inflammatory changes. Lymphatic: No abnormal abdominal or pelvic lymph nodes are identified. Reproductive: Prostate is unremarkable. Other: A 3.0 cm x 2.4 cm fat containing right inguinal hernia is seen. An additional 2.5 cm x 2.1 cm fat containing left inguinal hernia is also noted. No abdominopelvic ascites. Musculoskeletal: Multilevel degenerative changes are noted within the lumbar spine. Review of the MIP images confirms the above findings. IMPRESSION: 1. No evidence of pulmonary embolism or other acute intrathoracic process. 2. Marked severity coronary artery calcification. 3. Bilateral fat containing inguinal hernias. 4. Aortic atherosclerosis without evidence of aneurysmal dilatation, dissection, vasculitis or significant stenosis. Aortic Atherosclerosis (ICD10-I70.0). Electronically Signed   By: Aram Candelahaddeus  Houston M.D.   On: 07/23/2022 03:04   DG Chest Port 1 View  Result Date:  07/23/2022 CLINICAL DATA:  Severe chest pain and shortness of breath. EXAM: PORTABLE CHEST 1 VIEW COMPARISON:  September 20, 2016 FINDINGS: The heart size and mediastinal contours are within normal limits. Low lung volumes are noted. There is no evidence of an acute infiltrate, pleural effusion or pneumothorax. Multilevel degenerative changes are seen throughout the thoracic spine. IMPRESSION: No active disease. Electronically Signed   By: Aram Candelahaddeus  Houston M.D.   On: 07/23/2022 02:50      Subjective: Seen and examined on day of discharge.  Stable no distress.  Appropriate for discharge home.  Discharge Exam: Vitals:   07/24/22 1103 07/24/22 1216  BP: (!) 184/99 (!) 173/86  Pulse: 70 77  Resp: 18 18  Temp: 98.2 F (36.8 C) 98.3 F (36.8 C)  SpO2: 99% 100%   Vitals:   07/24/22 0316 07/24/22 0350 07/24/22 1103 07/24/22 1216  BP: (!) 149/83 (!) 157/99 (!) 184/99 (!) 173/86  Pulse: (!) 57 91 70 77  Resp: 18 20 18 18   Temp: 98 F (36.7 C) 98.1 F (36.7 C) 98.2 F (36.8 C) 98.3 F (36.8 C)  TempSrc:   Oral Oral  SpO2: 100% 100% 99% 100%  Weight:      Height:        General: Pt is alert, awake, not in acute distress Cardiovascular: RRR, S1/S2 +, no rubs, no gallops Respiratory: CTA bilaterally, no wheezing, no rhonchi Abdominal: Soft, NT, ND, bowel sounds +  Extremities: no edema, no cyanosis    The results of significant diagnostics from this hospitalization (including imaging, microbiology, ancillary and laboratory) are listed below for reference.     Microbiology: Recent Results (from the past 240 hour(s))  Resp panel by RT-PCR (RSV, Flu A&B, Covid) Anterior Nasal Swab     Status: None   Collection Time: 07/23/22  3:52 AM   Specimen: Anterior Nasal Swab  Result Value Ref Range Status   SARS Coronavirus 2 by RT PCR NEGATIVE NEGATIVE Final    Comment: (NOTE) SARS-CoV-2 target nucleic acids are NOT DETECTED.  The SARS-CoV-2 RNA is generally detectable in upper  respiratory specimens during the acute phase of infection. The lowest concentration of SARS-CoV-2 viral copies this assay can detect is 138 copies/mL. A negative result does not preclude SARS-Cov-2 infection and should not be used as the sole basis for treatment or other patient management decisions. A negative result may occur with  improper specimen collection/handling, submission of specimen other than nasopharyngeal swab, presence of viral mutation(s) within the areas targeted by this assay, and inadequate number of viral copies(<138 copies/mL). A negative result must be combined with clinical observations, patient history, and epidemiological information. The expected result is Negative.  Fact Sheet for Patients:  EntrepreneurPulse.com.au  Fact Sheet for Healthcare Providers:  IncredibleEmployment.be  This test is no t yet approved or cleared by the [place] FDA and  has been authorized for detection and/or diagnosis of SARS-CoV-2 by FDA under an Emergency Use Authorization (EUA). This EUA will remain  in effect (meaning this test can be used) for the duration of the COVID-19 declaration under Section 564(b)(1) of the Act, 21 U.S.C.section 360bbb-3(b)(1), unless the authorization is terminated  or revoked sooner.       Influenza A by PCR NEGATIVE NEGATIVE Final   Influenza B by PCR NEGATIVE NEGATIVE Final    Comment: (NOTE) The Xpert Xpress SARS-CoV-2/FLU/RSV plus assay is intended as an aid in the diagnosis of influenza from Nasopharyngeal swab specimens and should not be used as a sole basis for treatment. Nasal washings and aspirates are unacceptable for Xpert Xpress SARS-CoV-2/FLU/RSV testing.  Fact Sheet for Patients: EntrepreneurPulse.com.au  Fact Sheet for Healthcare Providers: IncredibleEmployment.be  This test is not yet approved or cleared by the [place] FDA and has been  authorized for detection and/or diagnosis of SARS-CoV-2 by FDA under an Emergency Use Authorization (EUA). This EUA will remain in effect (meaning this test can be used) for the duration of the COVID-19 declaration under Section 564(b)(1) of the Act, 21 U.S.C. section 360bbb-3(b)(1), unless the authorization is terminated or revoked.     Resp Syncytial Virus by PCR NEGATIVE NEGATIVE Final    Comment: (NOTE) Fact Sheet for Patients: EntrepreneurPulse.com.au  Fact Sheet for Healthcare Providers: IncredibleEmployment.be  This test is not yet approved or cleared by the [place] FDA and has been authorized for detection and/or diagnosis of SARS-CoV-2 by FDA under an Emergency Use Authorization (EUA). This EUA will remain in effect (meaning this test can be used) for the duration of the COVID-19 declaration under Section 564(b)(1) of the Act, 21 U.S.C. section 360bbb-3(b)(1), unless the authorization is terminated or revoked.  Performed at [facility], [address] [place], [place] [address]      Labs: BNP (last 3 results) No results for input(s): "BNP" in the last 8760 hours. Basic Metabolic Panel: Recent Labs  Lab 07/23/22 0129 07/23/22 0954  NA 137 139  K 2.7* 3.5  CL 100  106  CO2 21* 23  GLUCOSE 252* 143*  BUN 13 14  CREATININE 1.05 0.85  CALCIUM 8.5* 7.9*  MG  --  1.9   Liver Function Tests: Recent Labs  Lab 07/23/22 0129  AST 49*  ALT 30  ALKPHOS 71  BILITOT 1.8*  PROT 6.8  ALBUMIN 3.9   Recent Labs  Lab 07/23/22 0129  LIPASE 92*   No results for input(s): "AMMONIA" in the last 168 hours. CBC: Recent Labs  Lab 07/23/22 0129  WBC 25.2*  NEUTROABS 18.1*  HGB 18.5*  HCT 52.3*  MCV 84.5  PLT 298   Cardiac Enzymes: No results for input(s): "CKTOTAL", "CKMB", "CKMBINDEX", "TROPONINI" in the last 168 hours. BNP: Invalid input(s): "POCBNP" CBG: Recent Labs  Lab 07/23/22 1648 07/23/22 2111  07/24/22 0024 07/24/22 0354 07/24/22 0738  GLUCAP 212* 158* 114* 143* 126*   D-Dimer No results for input(s): "DDIMER" in the last 72 hours. Hgb A1c Recent Labs    07/23/22 0448  HGBA1C 6.6*   Lipid Profile Recent Labs    07/23/22 0530  CHOL 104  HDL 27*  LDLCALC 33  TRIG 562221*  CHOLHDL 3.9   Thyroid function studies No results for input(s): "TSH", "T4TOTAL", "T3FREE", "THYROIDAB" in the last 72 hours.  Invalid input(s): "FREET3" Anemia work up No results for input(s): "VITAMINB12", "FOLATE", "FERRITIN", "TIBC", "IRON", "RETICCTPCT" in the last 72 hours. Urinalysis    Component Value Date/Time   COLORURINE YELLOW (A) 07/23/2022 0625   APPEARANCEUR HAZY (A) 07/23/2022 0625   APPEARANCEUR Clear 09/22/2013 1102   LABSPEC >1.046 (H) 07/23/2022 0625   LABSPEC 1.006 09/22/2013 1102   PHURINE 6.0 07/23/2022 0625   GLUCOSEU >=500 (A) 07/23/2022 0625   GLUCOSEU Negative 09/22/2013 1102   HGBUR NEGATIVE 07/23/2022 0625   BILIRUBINUR NEGATIVE 07/23/2022 0625   BILIRUBINUR Negative 09/22/2013 1102   KETONESUR NEGATIVE 07/23/2022 0625   PROTEINUR NEGATIVE 07/23/2022 0625   NITRITE NEGATIVE 07/23/2022 0625   LEUKOCYTESUR NEGATIVE 07/23/2022 0625   LEUKOCYTESUR Negative 09/22/2013 1102   Sepsis Labs Recent Labs  Lab 07/23/22 0129  WBC 25.2*   Microbiology Recent Results (from the past 240 hour(s))  Resp panel by RT-PCR (RSV, Flu A&B, Covid) Anterior Nasal Swab     Status: None   Collection Time: 07/23/22  3:52 AM   Specimen: Anterior Nasal Swab  Result Value Ref Range Status   SARS Coronavirus 2 by RT PCR NEGATIVE NEGATIVE Final    Comment: (NOTE) SARS-CoV-2 target nucleic acids are NOT DETECTED.  The SARS-CoV-2 RNA is generally detectable in upper respiratory specimens during the acute phase of infection. The lowest concentration of SARS-CoV-2 viral copies this assay can detect is 138 copies/mL. A negative result does not preclude SARS-Cov-2 infection and should  not be used as the sole basis for treatment or other patient management decisions. A negative result may occur with  improper specimen collection/handling, submission of specimen other than nasopharyngeal swab, presence of viral mutation(s) within the areas targeted by this assay, and inadequate number of viral copies(<138 copies/mL). A negative result must be combined with clinical observations, patient history, and epidemiological information. The expected result is Negative.  Fact Sheet for Patients:  BloggerCourse.comhttps://www.fda.gov/media/152166/download  Fact Sheet for Healthcare Providers:  SeriousBroker.ithttps://www.fda.gov/media/152162/download  This test is no t yet approved or cleared by the [place] FDA and  has been authorized for detection and/or diagnosis of SARS-CoV-2 by FDA under an Emergency Use Authorization (EUA). This EUA will remain  in effect (meaning this test can  be used) for the duration of the COVID-19 declaration under Section 564(b)(1) of the Act, 21 U.S.C.section 360bbb-3(b)(1), unless the authorization is terminated  or revoked sooner.       Influenza A by PCR NEGATIVE NEGATIVE Final   Influenza B by PCR NEGATIVE NEGATIVE Final    Comment: (NOTE) The Xpert Xpress SARS-CoV-2/FLU/RSV plus assay is intended as an aid in the diagnosis of influenza from Nasopharyngeal swab specimens and should not be used as a sole basis for treatment. Nasal washings and aspirates are unacceptable for Xpert Xpress SARS-CoV-2/FLU/RSV testing.  Fact Sheet for Patients: BloggerCourse.com  Fact Sheet for Healthcare Providers: SeriousBroker.it  This test is not yet approved or cleared by the [place] FDA and has been authorized for detection and/or diagnosis of SARS-CoV-2 by FDA under an Emergency Use Authorization (EUA). This EUA will remain in effect (meaning this test can be used) for the duration of the COVID-19 declaration under  Section 564(b)(1) of the Act, 21 U.S.C. section 360bbb-3(b)(1), unless the authorization is terminated or revoked.     Resp Syncytial Virus by PCR NEGATIVE NEGATIVE Final    Comment: (NOTE) Fact Sheet for Patients: BloggerCourse.com  Fact Sheet for Healthcare Providers: SeriousBroker.it  This test is not yet approved or cleared by the [place] FDA and has been authorized for detection and/or diagnosis of SARS-CoV-2 by FDA under an Emergency Use Authorization (EUA). This EUA will remain in effect (meaning this test can be used) for the duration of the COVID-19 declaration under Section 564(b)(1) of the Act, 21 U.S.C. section 360bbb-3(b)(1), unless the authorization is terminated or revoked.  Performed at [facility], [address], [place], [place] [address]      Time coordinating discharge: Over 30 minutes  SIGNED:   Tresa Moore, MD  [facility] 07/24/2022, 2:06 PM Pager   If 7PM-7AM, please contact night-coverage

## 2022-07-24 NOTE — Progress Notes (Signed)
Rounding Note    Patient Name: Donald Hart Date of Encounter: 07/24/2022  [facility] Cardiologist: Lorine Bears, Donald   Subjective   She denies chest pain or shortness of breath, has not ambulated much today.  No acute events overnight.  Inpatient Medications    Scheduled Meds:  aspirin EC  81 mg Oral Daily   atorvastatin  40 mg Oral Daily   carvedilol  12.5 mg Oral BID WC   fenofibrate  160 mg Oral Daily   folic acid  1 mg Oral Daily   gabapentin  800 mg Oral TID   insulin aspart  0-15 Units Subcutaneous Q4H   LORazepam  0-4 mg Intravenous Q6H   Followed by   Melene Muller ON 07/25/2022] LORazepam  0-4 mg Intravenous Q12H   [START ON 07/25/2022] losartan  100 mg Oral Daily   multivitamin with minerals  1 tablet Oral Daily   nicotine  21 mg Transdermal Daily   pantoprazole  40 mg Oral Daily   sodium chloride flush  3 mL Intravenous Q12H   sodium chloride flush  3 mL Intravenous Q12H   thiamine  100 mg Oral Daily   Or   thiamine  100 mg Intravenous Daily   ticagrelor  90 mg Oral BID   Continuous Infusions:  sodium chloride     PRN Meds: sodium chloride, acetaminophen, LORazepam, nitroGLYCERIN, ondansetron (ZOFRAN) IV, sodium chloride flush   Vital Signs    Vitals:   07/24/22 0008 07/24/22 0316 07/24/22 0350 07/24/22 1103  BP: (!) 145/85 (!) 149/83 (!) 157/99 (!) 184/99  Pulse: 62 (!) 57 91 70  Resp: 16 18 20 18   Temp: 99.2 F (37.3 C) 98 F (36.7 C) 98.1 F (36.7 C) 98.2 F (36.8 C)  TempSrc: Oral   Oral  SpO2: 99% 100% 100% 99%  Weight:      Height:        Intake/Output Summary (Last 24 hours) at 07/24/2022 1134 Last data filed at 07/24/2022 0846 Gross per 24 hour  Intake 1436.82 ml  Output 900 ml  Net 536.82 ml      07/23/2022    1:17 AM 09/11/2021    4:08 PM 01/30/2018   12:53 AM  Last 3 Weights  Weight (lbs) 205 lb 200 lb 240 lb  Weight (kg) 92.987 kg 90.719 kg 108.863 kg      Telemetry    Sinus rhythm- Personally  Reviewed  ECG     - Personally Reviewed  Physical Exam   GEN: No acute distress.   Neck: No JVD Cardiac: RRR, no murmurs, rubs, or gallops.  Respiratory: Clear to auscultation bilaterally. GI: Soft, nontender, non-distended  MS: No edema; No deformity. Neuro:  Nonfocal  Psych: Normal affect   Labs    High Sensitivity Troponin:   Recent Labs  Lab 07/23/22 0129 07/23/22 0333 07/23/22 0954 07/23/22 1700  TROPONINIHS 15 83* 2,777* 6,064*     Chemistry Recent Labs  Lab 07/23/22 0129 07/23/22 0954  NA 137 139  K 2.7* 3.5  CL 100 106  CO2 21* 23  GLUCOSE 252* 143*  BUN 13 14  CREATININE 1.05 0.85  CALCIUM 8.5* 7.9*  MG  --  1.9  PROT 6.8  --   ALBUMIN 3.9  --   AST 49*  --   ALT 30  --   ALKPHOS 71  --   BILITOT 1.8*  --   GFRNONAA >60 >60  ANIONGAP 16* 10    Lipids  Recent Labs  Lab 07/23/22 0530  CHOL 104  TRIG 221*  HDL 27*  LDLCALC 33  CHOLHDL 3.9    Hematology Recent Labs  Lab 07/23/22 0129  WBC 25.2*  RBC 6.19*  HGB 18.5*  HCT 52.3*  MCV 84.5  MCH 29.9  MCHC 35.4  RDW 12.8  PLT 298   Thyroid No results for input(s): "TSH", "FREET4" in the last 168 hours.  BNPNo results for input(s): "BNP", "PROBNP" in the last 168 hours.  DDimer No results for input(s): "DDIMER" in the last 168 hours.   Radiology    CARDIAC CATHETERIZATION  Result Date: 07/23/2022   Prox RCA lesion is 20% stenosed.   Mid RCA lesion is 20% stenosed.   RPDA lesion is 30% stenosed.   Mid Cx to Dist Cx lesion is 80% stenosed.   Mid LAD lesion is 30% stenosed.   Dist LAD lesion is 50% stenosed.   A drug-eluting stent was successfully placed using a STENT ONYX FRONTIER 2.75X22.   Post intervention, there is a 0% residual stenosis.   The left ventricular systolic function is normal.   LV end diastolic pressure is mildly elevated.   The left ventricular ejection fraction is 55-65% by visual estimate. 1.  Severe one-vessel coronary artery disease involving the mid left  circumflex.  Moderately calcified coronary arteries overall. 2.  Normal LV systolic function and mildly elevated left ventricular end-diastolic pressure. 3.  Successful angioplasty and drug-eluting stent placement to the mid left circumflex. Recommendations: Dual antiplatelet therapy for at least 12 months. Aggressive treatment of risk factors. Possible discharge home tomorrow.   CT Angio Chest/Abd/Pel for Dissection W and/or Wo Contrast  Result Date: 07/23/2022 CLINICAL DATA:  Chest pain and shortness of breath. EXAM: CT ANGIOGRAPHY CHEST, ABDOMEN AND PELVIS TECHNIQUE: Non-contrast CT of the chest was initially obtained. Multidetector CT imaging through the chest, abdomen and pelvis was performed using the standard protocol during bolus administration of intravenous contrast. Multiplanar reconstructed images and MIPs were obtained and reviewed to evaluate the vascular anatomy. RADIATION DOSE REDUCTION: This exam was performed according to the departmental dose-optimization program which includes automated exposure control, adjustment of the mA and/or kV according to patient size and/or use of iterative reconstruction technique. CONTRAST:  130mL OMNIPAQUE IOHEXOL 350 MG/ML SOLN COMPARISON:  January 30, 2018 FINDINGS: CTA CHEST FINDINGS Cardiovascular: There is mild calcification of the aortic arch and descending thoracic aorta, without evidence of thoracic aortic aneurysm or dissection. Satisfactory opacification of the pulmonary arteries to the segmental level. No evidence of pulmonary embolism. Normal heart size with marked severity coronary artery calcification. No pericardial effusion. Mediastinum/Nodes: No enlarged mediastinal, hilar, or axillary lymph nodes. Thyroid gland, trachea, and esophagus demonstrate no significant findings. Lungs/Pleura: Lungs are clear. No pleural effusion or pneumothorax. Musculoskeletal: Multilevel degenerative changes are seen throughout the thoracic spine. Review of the MIP  images confirms the above findings. CTA ABDOMEN AND PELVIS FINDINGS VASCULAR Aorta: Moderate severity calcification and atherosclerosis of a normal caliber aorta without aneurysm, dissection, vasculitis or significant stenosis. Celiac: Patent without evidence of aneurysm, dissection, vasculitis or significant stenosis. SMA: Patent without evidence of aneurysm, dissection, vasculitis or significant stenosis. Renals: Both renal arteries are patent without evidence of aneurysm, dissection, vasculitis, fibromuscular dysplasia or significant stenosis. IMA: Patent without evidence of aneurysm, dissection, vasculitis or significant stenosis. Inflow: Moderate to marked severity calcification and atherosclerosis, without evidence of aneurysm, dissection, vasculitis or significant stenosis. Veins: No obvious venous abnormality within the limitations of this arterial phase study.  Review of the MIP images confirms the above findings. NON-VASCULAR Hepatobiliary: No focal liver abnormality is seen. No gallstones, gallbladder wall thickening, or biliary dilatation. Pancreas: Unremarkable. No pancreatic ductal dilatation or surrounding inflammatory changes. Spleen: Normal in size without focal abnormality. Adrenals/Urinary Tract: Adrenal glands are unremarkable. Kidneys are normal in size, without renal calculi or hydronephrosis. A 2.0 cm x 1.4 cm simple cyst is seen within the lateral aspect of the mid right kidney. Bladder is unremarkable. Stomach/Bowel: Stomach is within normal limits. Appendix appears normal. No evidence of bowel wall thickening, distention, or inflammatory changes. Lymphatic: No abnormal abdominal or pelvic lymph nodes are identified. Reproductive: Prostate is unremarkable. Other: A 3.0 cm x 2.4 cm fat containing right inguinal hernia is seen. An additional 2.5 cm x 2.1 cm fat containing left inguinal hernia is also noted. No abdominopelvic ascites. Musculoskeletal: Multilevel degenerative changes are noted  within the lumbar spine. Review of the MIP images confirms the above findings. IMPRESSION: 1. No evidence of pulmonary embolism or other acute intrathoracic process. 2. Marked severity coronary artery calcification. 3. Bilateral fat containing inguinal hernias. 4. Aortic atherosclerosis without evidence of aneurysmal dilatation, dissection, vasculitis or significant stenosis. Aortic Atherosclerosis (ICD10-I70.0). Electronically Signed   By: Virgina Norfolk M.D.   On: 07/23/2022 03:04   DG Chest Port 1 View  Result Date: 07/23/2022 CLINICAL DATA:  Severe chest pain and shortness of breath. EXAM: PORTABLE CHEST 1 VIEW COMPARISON:  September 20, 2016 FINDINGS: The heart size and mediastinal contours are within normal limits. Low lung volumes are noted. There is no evidence of an acute infiltrate, pleural effusion or pneumothorax. Multilevel degenerative changes are seen throughout the thoracic spine. IMPRESSION: No active disease. Electronically Signed   By: Virgina Norfolk M.D.   On: 07/23/2022 02:50    Cardiac Studies   TTE 07/23/2022  1. Left ventricular ejection fraction, by estimation, is 60 to 65%. The  left ventricle has normal function. The left ventricle has no regional  wall motion abnormalities. Left ventricular diastolic parameters were  normal.   2. Right ventricular systolic function is normal. The right ventricular  size is normal.   3. The mitral valve is normal in structure. No evidence of mitral valve  regurgitation.   4. The aortic valve is tricuspid. Aortic valve regurgitation is not  visualized. Aortic valve sclerosis/calcification is present, without any  evidence of aortic stenosis.   Patient Profile     52 y.o. male with history of hypertension, diabetes, smoker presenting with chest pain, diagnosed with NSTEMI s/p left heart cath revealing significant mid left circumflex stenosis underwent drug-eluting stent placement.  Assessment & Plan    NSTEMI s/p DES to left  circumflex -Denies chest pain -Continue aspirin, Brilinta, Lipitor -Echo with normal EF -Ambulation encouraged.  2.  Hypertension -BP elevated -Increase Coreg to 12.5 mg twice daily, losartan 100 mg daily -Okay to PTA Norvasc if BP not adequately controlled -Okay for discharge after BP reasonable.  3.  Smoking -Cessation advised  Ambulation encouraged, if no chest pain and BP is controlled.  Okay for discharge from a cardiac perspective.  Total encounter time more than 50 minutes  Greater than 50% was spent in counseling and coordination of care with the patient   Signed, Kate Sable, Donald  07/24/2022, 11:34 AM

## 2022-07-31 ENCOUNTER — Encounter: Payer: Self-pay | Admitting: Nurse Practitioner

## 2022-07-31 ENCOUNTER — Ambulatory Visit: Payer: Medicaid Other | Attending: Nurse Practitioner | Admitting: Nurse Practitioner

## 2022-07-31 VITALS — BP 148/78 | HR 82 | Ht 74.0 in | Wt 208.8 lb

## 2022-07-31 DIAGNOSIS — E785 Hyperlipidemia, unspecified: Secondary | ICD-10-CM | POA: Diagnosis not present

## 2022-07-31 DIAGNOSIS — E1169 Type 2 diabetes mellitus with other specified complication: Secondary | ICD-10-CM | POA: Insufficient documentation

## 2022-07-31 DIAGNOSIS — I2510 Atherosclerotic heart disease of native coronary artery without angina pectoris: Secondary | ICD-10-CM | POA: Diagnosis not present

## 2022-07-31 DIAGNOSIS — I214 Non-ST elevation (NSTEMI) myocardial infarction: Secondary | ICD-10-CM | POA: Insufficient documentation

## 2022-07-31 DIAGNOSIS — E781 Pure hyperglyceridemia: Secondary | ICD-10-CM | POA: Diagnosis present

## 2022-07-31 DIAGNOSIS — I10 Essential (primary) hypertension: Secondary | ICD-10-CM | POA: Insufficient documentation

## 2022-07-31 MED ORDER — CARVEDILOL 25 MG PO TABS: 25.0000 mg | ORAL_TABLET | Freq: Two times a day (BID) | ORAL | 3 refills | Status: AC

## 2022-07-31 MED ORDER — TICAGRELOR 90 MG PO TABS: 90.0000 mg | ORAL_TABLET | Freq: Two times a day (BID) | ORAL | 6 refills | Status: DC

## 2022-07-31 MED ORDER — TICAGRELOR 90 MG PO TABS: 90.0000 mg | ORAL_TABLET | Freq: Two times a day (BID) | ORAL | 0 refills | Status: DC

## 2022-07-31 NOTE — Patient Instructions (Signed)
Medication Instructions:  Your physician has recommended you make the following change in your medication:   START - carvedilol (COREG) 25 MG tablet Take 1 tablet (25 mg total) by mouth 2 (two) times daily.   *If you need a refill on your cardiac medications before your next appointment, please call your pharmacy*   Lab Work: - None ordered  If you have labs (blood work) drawn today and your tests are completely normal, you will receive your results only by: [place] (if you have MyChart) OR A paper copy in the mail If you have any lab test that is abnormal or we need to change your treatment, we will call you to review the results.   Testing/Procedures: - None ordered   Follow-Up: At [facility], you and your health needs are our priority.  As part of our continuing mission to provide you with exceptional heart care, we have created designated Provider Care Teams.  These Care Teams include your primary Cardiologist (physician) and Advanced Practice Providers (APPs -  Physician Assistants and Nurse Practitioners) who all work together to provide you with the care you need, when you need it.  We recommend signing up for the patient portal called "MyChart".  Sign up information is provided on this After Visit Summary.  MyChart is used to connect with patients for Virtual Visits (Telemedicine).  Patients are able to view lab/test results, encounter notes, upcoming appointments, etc.  Non-urgent messages can be sent to your provider as well.   To learn more about what you can do with MyChart, go to [email or web address].    Your next appointment:   1 month(s)  Provider:   You may see Kathlyn Sacramento, MD or one of the following Advanced Practice Providers on your designated Care Team:   Murray Hodgkins, NP  Other Instructions -None

## 2022-07-31 NOTE — Progress Notes (Signed)
Office Visit    Patient Name: Donald Hart Date of Encounter: 07/31/2022  Primary Care Provider:  [facility] Primary Cardiologist:  Kathlyn Sacramento, MD  Chief Complaint    52 year old male with a history of pancreatitis, chronic hypertriglyceridemia, remote alcohol abuse, tobacco abuse, type 2 diabetes mellitus, hypertension, hyperlipidemia, gout, chronic back pain, GERD, and family history of premature CAD, who presents for follow-up after recent hospitalization for non-STEMI.  Past Medical History    Past Medical History:  Diagnosis Date   Acute pancreatitis 07/02/2015   hospitalization at [facility]   CAD (coronary artery disease)    a. 07/2022 NSTEMI/PCI: LM nl, LAD 6m, 50d, RI nl, LCX 67m (2.75x22 Onyx Frontier DES), RCA 20p/m, RPDA 30. EF 55-65%.   Chronic back pain    Diastolic dysfunction    a. 01/2016 Echo: EF 60-65%, GrI DD. Mild LVH, nl RV fxn, mildly dil LA; b. 07/2022 Echo: EF 60-65%, no rwma, nl RV fxn.   Family history of diabetes mellitus    sister   Family history of heart attack    Father.    Family history of stomach cancer    Gout    Hypertension    Hypertriglyceridemia    Remote Alcohol abuse    Tobacco abuse    Type 2 diabetes mellitus ([place]) 07/02/2015   Past Surgical History:  Procedure Laterality Date   CORONARY STENT INTERVENTION N/A 07/23/2022   Procedure: CORONARY STENT INTERVENTION;  Surgeon: Wellington Hampshire, MD;  Location: [facility];  Service: Cardiovascular;  Laterality: N/A;   LEFT HEART CATH AND CORONARY ANGIOGRAPHY N/A 07/23/2022   Procedure: LEFT HEART CATH AND CORONARY ANGIOGRAPHY;  Surgeon: Wellington Hampshire, MD;  Location: [facility];  Service: Cardiovascular;  Laterality: N/A;   NO PAST SURGERIES      Allergies  No Known Allergies  History of Present Illness    52 year old male with a history of pancreatitis, chronic hypertriglyceridemia, remote alcohol abuse, tobacco abuse,  diabetes, hypertension, hyperlipidemia, gout, chronic back pain, GERD, and family history of premature CAD.  In 2017, he required hospitalization for pancreatitis and hypertriglyceridemia (6751) requiring plasmapheresis and insulin drip, complicated by distributive shock and respiratory failure requiring intubation, further complicated by MRSA pneumonia, bacteremia, AKI, and deconditioning.  Echo showed normal LV function at that time.  He was readmitted with pancreatitis in 2018 was evaluated by our team in the setting of elevated triglycerides with recommendation for outpatient endocrinology follow-up.  On July 22, 2022, he presented to the [facility] emergency department secondary to substernal chest tightness.  On further questioning, he reported progressive exertional chest discomfort over the preceding year.  In the ED, ECG was unremarkable.  He was initially hypotensive.  He ruled in for non-STEMI.  He underwent diagnostic catheterization revealing severe left circumflex disease, which was successfully treated with a drug-eluting stent.  Echo during hospitalization showed normal LV function.  Since discharge, [name] has been feeling reasonably well.  He occasionally notes a sharp and fleeting chest pain but has not had any angina.  He has had the occasional sensation of needing to take a deep breath.  He has not had any significant dyspnea on exertion.  And we discussed how Brilinta may be contributing to his symptoms.  He denies palpitations, PND, orthopnea, dizziness, syncope, edema, or early satiety.  He is interested in enrolling in cardiac rehabilitation.  Home Medications    Current Outpatient Medications  Medication Sig Dispense Refill  aspirin EC 81 MG tablet Take 1 tablet (81 mg total) by mouth daily. Swallow whole. 30 tablet 12   atorvastatin (LIPITOR) 80 MG tablet Take 80 mg by mouth daily.     fenofibrate 160 MG tablet Take 1 tablet (160 mg total) by mouth daily. 30 tablet 0    gabapentin (NEURONTIN) 800 MG tablet Take 800 mg by mouth 3 (three) times daily.     hydrochlorothiazide (HYDRODIURIL) 50 MG tablet Take 50 mg by mouth daily.     JARDIANCE 25 MG TABS tablet Take 25 mg by mouth daily.     losartan (COZAAR) 100 MG tablet Take 1 tablet (100 mg total) by mouth daily. 30 tablet 0   omeprazole (PRILOSEC) 20 MG capsule Take 20 mg by mouth daily.     albuterol (PROVENTIL HFA;VENTOLIN HFA) 108 (90 Base) MCG/ACT inhaler Inhale 1-2 puffs every 6 (six) hours as needed into the lungs for wheezing or shortness of breath. 1 Inhaler 0   carvedilol (COREG) 25 MG tablet Take 1 tablet (25 mg total) by mouth 2 (two) times daily. 180 tablet 3   colchicine 0.6 MG tablet Take 0.6 mg by mouth daily as needed. (Patient not taking: Reported on 07/31/2022)     docusate sodium (COLACE) 100 MG capsule Take 1 capsule (100 mg total) by mouth 2 (two) times daily as needed for mild constipation. (Patient not taking: Reported on 07/31/2022) 10 capsule 0   folic acid (FOLVITE) 1 MG tablet Take 1 mg by mouth daily. (Patient not taking: Reported on 07/31/2022)     hyoscyamine (LEVSIN, ANASPAZ) 0.125 MG tablet Take 1 tablet (0.125 mg total) by mouth every 6 (six) hours as needed for cramping. (Patient not taking: Reported on 07/31/2022) 30 tablet 0   magnesium oxide (MAG-OX) 400 MG tablet Take 400 mg by mouth daily. (Patient not taking: Reported on 07/31/2022)     Multiple Vitamin (MULTIVITAMIN WITH MINERALS) TABS tablet Take 1 tablet by mouth daily. (Patient not taking: Reported on 07/31/2022)     nicotine (NICODERM CQ - DOSED IN MG/24 HOURS) 21 mg/24hr patch Place 1 patch (21 mg total) onto the skin daily. (Patient not taking: Reported on 07/31/2022) 28 patch 0   omega-3 acid ethyl esters (LOVAZA) 1 g capsule Take 2 g by mouth 2 (two) times daily. (Patient not taking: Reported on 07/31/2022)     thiamine 100 MG tablet Take 1 tablet (100 mg total) by mouth daily. (Patient not taking: Reported on 07/31/2022)      ticagrelor (BRILINTA) 90 MG TABS tablet Take 1 tablet (90 mg total) by mouth 2 (two) times daily. 60 tablet 0   No current facility-administered medications for this visit.     Review of Systems    Occasional fleeting chest pain.  Occasional sense of needing to take a deep breath.  He denies dyspnea on exertion, palpitations, PND, orthopnea, dizziness, syncope, edema, or early satiety.  All other systems reviewed and are otherwise negative except as noted above.   Cardiac Rehabilitation Eligibility Assessment  The patient is ready to start cardiac rehabilitation from a cardiac standpoint.    Physical Exam    VS:  BP (!) 143/80 (BP Location: Right Arm, Patient Position: Sitting, Cuff Size: Normal)   Pulse 82   Ht 6\' 2"  (1.88 m)   Wt 208 lb 12.8 oz (94.7 kg)   SpO2 98%   BMI 26.81 kg/m  , BMI Body mass index is 26.81 kg/m.     Vitals:   07/31/22  1345 07/31/22 1735  BP: (!) 143/80 (!) 148/78  Pulse: 82   SpO2: 98%     GEN: Well nourished, well developed, in no acute distress. HEENT: normal. Neck: Supple, no JVD, carotid bruits, or masses. Cardiac: RRR, no murmurs, rubs, or gallops. No clubbing, cyanosis, edema.  Radials 2+/PT 2+ and equal bilaterally.  Right radial catheterization site without bleeding, bruit, or hematoma. Respiratory:  Respirations regular and unlabored, clear to auscultation bilaterally. GI: Soft, nontender, nondistended, BS + x 4. MS: no deformity or atrophy. Skin: warm and dry, no rash. Neuro:  Strength and sensation are intact. Psych: Normal affect.  Accessory Clinical Findings    ECG personally reviewed by me today -regular sinus rhythm, 82- no acute changes.  Lab Results  Component Value Date   WBC 25.2 (H) 07/23/2022   HGB 18.5 (H) 07/23/2022   HCT 52.3 (H) 07/23/2022   MCV 84.5 07/23/2022   PLT 298 07/23/2022   Lab Results  Component Value Date   CREATININE 0.85 07/23/2022   BUN 14 07/23/2022   NA 139 07/23/2022   K 3.5 07/23/2022    CL 106 07/23/2022   CO2 23 07/23/2022   Lab Results  Component Value Date   ALT 30 07/23/2022   AST 49 (H) 07/23/2022   ALKPHOS 71 07/23/2022   BILITOT 1.8 (H) 07/23/2022   Lab Results  Component Value Date   CHOL 104 07/23/2022   HDL 27 (L) 07/23/2022   LDLCALC 33 07/23/2022   TRIG 221 (H) 07/23/2022   CHOLHDL 3.9 07/23/2022    Lab Results  Component Value Date   HGBA1C 6.6 (H) 07/23/2022    Assessment & Plan    1.  Non-STEMI, subsequent episode of care/coronary artery disease: Status post recent hospitalization with chest pain in the setting of a 1 year history of progressive exertional angina.  He ruled in for non-STEMI.  Diagnostic catheterization revealed severe circumflex disease which was successfully treated with a drug-eluting stent.  He otherwise had residual moderate, nonobstructive disease.  He had occasional fleeting chest pains but no significant angina since discharge.  He has had some vague shortness of breath and a sensation of having to take deep breaths, which is most likely secondary to Brilinta.  We discussed this today.  He is willing to remain on Brilinta therapy.  I am going to titrate beta-blocker therapy in the setting of hypertension.  He otherwise remains on aspirin, high potency statin, and ARB therapy.  He is interested in cardiac rehabilitation and plans to enroll.  2.  Essential hypertension: Blood pressure elevated today.  He is taking carvedilol, losartan, and HCTZ.  I am going to titrate his carvedilol to 25 mg twice daily.  He does have a cuff at home and I have encouraged him to use it.  3.  Hyperlipidemia/hypertriglyceridemia: LDL of 33 with triglycerides of 221 during hospitalization.  Continue high potency statin therapy along with fenofibrate.  He indicates he is no longer taking Lovaza.  4.  Type 2 diabetes mellitus: A1c 6.6 earlier this month.  He is on Jardiance and followed by primary care.  5.  Tobacco/vape usage: No longer smokes  cigarettes and is mostly vaping.  Recognizes he should quit.  Complete cessation advised.  6.  Disposition: Looking forward to participating in cardiac rehab.  Will plan to see him back in 1 month.   Murray Hodgkins, NP 07/31/2022, 5:35 PM

## 2022-08-12 ENCOUNTER — Encounter: Payer: Medicaid Other | Attending: Cardiovascular Disease | Admitting: *Deleted

## 2022-08-12 ENCOUNTER — Encounter: Payer: Self-pay | Admitting: *Deleted

## 2022-08-12 DIAGNOSIS — I214 Non-ST elevation (NSTEMI) myocardial infarction: Secondary | ICD-10-CM

## 2022-08-12 DIAGNOSIS — Z955 Presence of coronary angioplasty implant and graft: Secondary | ICD-10-CM | POA: Insufficient documentation

## 2022-08-12 NOTE — Progress Notes (Signed)
Virtual orientation call completed today. he has an appointment on Date: 0213/2024  for EP eval and gym Orientation.  Documentation of diagnosis can be found in [facility] Date: [id number] .

## 2022-08-13 VITALS — Ht 73.5 in | Wt 200.1 lb

## 2022-08-13 DIAGNOSIS — Z955 Presence of coronary angioplasty implant and graft: Secondary | ICD-10-CM | POA: Diagnosis not present

## 2022-08-13 DIAGNOSIS — I214 Non-ST elevation (NSTEMI) myocardial infarction: Secondary | ICD-10-CM | POA: Diagnosis present

## 2022-08-13 NOTE — Progress Notes (Signed)
Cardiac Individual Treatment Plan  Patient Details  Name: Donald Hart MRN: [id number] Date of Birth: 1970-09-15 Referring Provider:   Flowsheet Row Cardiac Rehab from 08/13/2022 in [facility] Cardiac and Pulmonary Rehab  Referring Provider Dr. Kathlyn Sacramento, MD       Initial Encounter Date:  Flowsheet Row Cardiac Rehab from 08/13/2022 in [facility] Cardiac and Pulmonary Rehab  Date 08/13/22       Visit Diagnosis: NSTEMI (non-ST elevation myocardial infarction) [facility])  Status post coronary artery stent placement  Patient's Home Medications on Admission:  Current Outpatient Medications:    albuterol (PROVENTIL HFA;VENTOLIN HFA) 108 (90 Base) MCG/ACT inhaler, Inhale 1-2 puffs every 6 (six) hours as needed into the lungs for wheezing or shortness of breath. (Patient not taking: Reported on 08/12/2022), Disp: 1 Inhaler, Rfl: 0   aspirin EC 81 MG tablet, Take 1 tablet (81 mg total) by mouth daily. Swallow whole., Disp: 30 tablet, Rfl: 12   atorvastatin (LIPITOR) 80 MG tablet, Take 80 mg by mouth daily., Disp: , Rfl:    carvedilol (COREG) 25 MG tablet, Take 1 tablet (25 mg total) by mouth 2 (two) times daily., Disp: 180 tablet, Rfl: 3   colchicine 0.6 MG tablet, Take 0.6 mg by mouth daily as needed. (Patient not taking: Reported on 07/31/2022), Disp: , Rfl:    docusate sodium (COLACE) 100 MG capsule, Take 1 capsule (100 mg total) by mouth 2 (two) times daily as needed for mild constipation. (Patient not taking: Reported on 07/31/2022), Disp: 10 capsule, Rfl: 0   fenofibrate 160 MG tablet, Take 1 tablet (160 mg total) by mouth daily., Disp: 30 tablet, Rfl: 0   folic acid (FOLVITE) 1 MG tablet, Take 1 mg by mouth daily. (Patient not taking: Reported on 07/31/2022), Disp: , Rfl:    gabapentin (NEURONTIN) 800 MG tablet, Take 800 mg by mouth 3 (three) times daily., Disp: , Rfl:    hydrochlorothiazide (HYDRODIURIL) 50 MG tablet, Take 50 mg by mouth daily., Disp: , Rfl:    hyoscyamine (LEVSIN, ANASPAZ)  0.125 MG tablet, Take 1 tablet (0.125 mg total) by mouth every 6 (six) hours as needed for cramping. (Patient not taking: Reported on 07/31/2022), Disp: 30 tablet, Rfl: 0   JARDIANCE 25 MG TABS tablet, Take 25 mg by mouth daily., Disp: , Rfl:    losartan (COZAAR) 100 MG tablet, Take 1 tablet (100 mg total) by mouth daily., Disp: 30 tablet, Rfl: 0   magnesium oxide (MAG-OX) 400 MG tablet, Take 400 mg by mouth daily. (Patient not taking: Reported on 07/31/2022), Disp: , Rfl:    Multiple Vitamin (MULTIVITAMIN WITH MINERALS) TABS tablet, Take 1 tablet by mouth daily. (Patient not taking: Reported on 07/31/2022), Disp: , Rfl:    nicotine (NICODERM CQ - DOSED IN MG/24 HOURS) 21 mg/24hr patch, Place 1 patch (21 mg total) onto the skin daily. (Patient not taking: Reported on 07/31/2022), Disp: 28 patch, Rfl: 0   omega-3 acid ethyl esters (LOVAZA) 1 g capsule, Take 2 g by mouth 2 (two) times daily. (Patient not taking: Reported on 07/31/2022), Disp: , Rfl:    omeprazole (PRILOSEC) 20 MG capsule, Take 20 mg by mouth daily., Disp: , Rfl:    thiamine 100 MG tablet, Take 1 tablet (100 mg total) by mouth daily. (Patient not taking: Reported on 07/31/2022), Disp: , Rfl:    ticagrelor (BRILINTA) 90 MG TABS tablet, Take 1 tablet (90 mg total) by mouth 2 (two) times daily., Disp: 60 tablet, Rfl: 6  Past Medical History:  Past Medical History:  Diagnosis Date   Acute pancreatitis 07/02/2015   hospitalization at [facility]   CAD (coronary artery disease)    a. 07/2022 NSTEMI/PCI: LM nl, LAD 51m 50d, RI nl, LCX [id number] Onyx Frontier DES), RCA 20p/m, RPDA 30. EF 55-65%.   Chronic back pain    Diastolic dysfunction    a. 01/2016 Echo: EF 60-65%, GrI DD. Mild LVH, nl RV fxn, mildly dil LA; b. 07/2022 Echo: EF 60-65%, no rwma, nl RV fxn.   Family history of diabetes mellitus    sister   Family history of heart attack    Father.    Family history of stomach cancer    Gout    Hypertension    Hypertriglyceridemia    Remote  Alcohol abuse    Tobacco abuse    [place] [place]    Tobacco Use: Social History   Tobacco Use  Smoking Status Former   Packs/day: 1.00   Years: 20.00   Total pack years: 20.00   Types: Cigarettes   Quit date: 07/23/2022   Years since quitting: 0.0  Smokeless Tobacco Never  Tobacco Comments   Currently -ano cigarettes since heart attack vapes regularly throughout the week.  Wants to quit    Labs: Review Flowsheet  More data exists      Latest Ref Rng & Units 09/27/2016 09/28/2016 09/29/2016 09/30/2016 07/23/2022  Labs for ITP Cardiac and Pulmonary Rehab  Cholestrol 0 - 200 mg/dL - - - - 104   LDL (calc) 0 - 99 mg/dL - - - - 33   HDL-C >40 mg/dL - - - - 27   Trlycerides <150 mg/dL 610  524  654  676  650  647  642  221   Hemoglobin A1c 4.8 - 5.6 % - - - - 6.6      Exercise Target Goals: Exercise Program Goal: Individual exercise prescription set using results from initial 6 min walk test and THRR while considering  patient's activity barriers and safety.   Exercise Prescription Goal: Initial exercise prescription builds to 30-45 minutes a day of aerobic activity, 2-3 days per week.  Home exercise guidelines will be given to patient during program as part of exercise prescription that the participant will acknowledge.   Education: Aerobic Exercise: - Group verbal and visual presentation on the components of exercise prescription. Introduces F.I.T.T principle from ACSM for exercise prescriptions.  Reviews F.I.T.T. principles of aerobic exercise including progression. Written material given at graduation. [facility]  Education need identified 08/13/22       Education: Resistance Exercise: - Group verbal and visual presentation on the components of exercise prescription. Introduces F.I.T.T principle from ACSM for exercise prescriptions  Reviews F.I.T.T. principles of resistance  exercise including progression. Written material given at graduation.    Education: Exercise & Equipment Safety: - Individual verbal instruction and demonstration of equipment use and safety with use of the equipment. [facility]  Date 08/13/22  Educator NT  Instruction Review Code 1- Verbalizes Understanding       Education: Exercise Physiology & General Exercise Guidelines: - Group verbal and written instruction with models to review the exercise physiology of the cardiovascular system and associated critical values. Provides general exercise guidelines with specific guidelines to those with heart or lung disease.    Education: Flexibility, Balance, Mind/Body Relaxation: - Group verbal and  visual presentation with interactive activity on the components of exercise prescription. Introduces F.I.T.T principle from ACSM for exercise prescriptions. Reviews F.I.T.T. principles of flexibility and balance exercise training including progression. Also discusses the mind body connection.  Reviews various relaxation techniques to help reduce and manage stress (i.e. Deep breathing, progressive muscle relaxation, and visualization). Balance handout provided to take home. Written material given at graduation.   Activity Barriers & Risk Stratification:  Activity Barriers & Cardiac Risk Stratification - 08/13/22 0949       Activity Barriers & Cardiac Risk Stratification   Activity Barriers [name];Other (comment)    Comments chronic muscle aches, lower back problems, Hx broken left arm    Cardiac Risk Stratification High             6 Minute Walk:  6 Minute Walk     Row Name 08/13/22 0947         6 Minute Walk   Phase Initial     Distance 1165 feet     Walk Time 6 minutes     # of Rest Breaks 0     MPH 2.21     METS 4.06     RPE 13     Perceived Dyspnea  3     VO2 Peak 14.2     Symptoms Yes (comment)      Comments SOB     Resting HR 84 bpm     Resting BP 124/68     Resting Oxygen Saturation  98 %     Exercise Oxygen Saturation  during 6 min walk 97 %     Max Ex. HR 105 bpm     Max Ex. BP 138/72     2 Minute Post BP 94/56              Oxygen Initial Assessment:   Oxygen Re-Evaluation:   Oxygen Discharge (Final Oxygen Re-Evaluation):   Initial Exercise Prescription:  Initial Exercise Prescription - 08/13/22 0900       Date of Initial Exercise RX and Referring Provider   Date 08/13/22    Referring Provider Dr. Kathlyn Sacramento, MD      Oxygen   Maintain Oxygen Saturation 88% or higher      Treadmill   MPH 1.8    Grade 0.5    Minutes 15    METs 2.5      NuStep   Level 2    SPM 80    Minutes 15    METs 4.06      REL-XR   Level 1    Speed 50    Minutes 15    METs 4.06      Prescription Details   Frequency (times per week) 3    Duration Progress to 30 minutes of continuous aerobic without signs/symptoms of physical distress      Intensity   THRR 40-80% of Max Heartrate 118-152    Ratings of Perceived Exertion 11-13    Perceived Dyspnea 0-4      Progression   Progression Continue to progress workloads to maintain intensity without signs/symptoms of physical distress.      Resistance Training   Training Prescription Yes    Weight 3 lb    Reps 10-15             Perform Capillary Blood Glucose checks as needed.  Exercise Prescription Changes:   Exercise Prescription Changes     Row Name 08/13/22 0900  Response to Exercise   Blood Pressure (Admit) 124/68       Blood Pressure (Exercise) 138/72       Blood Pressure (Exit) 94/56       Heart Rate (Admit) 84 bpm       Heart Rate (Exercise) 105 bpm       Heart Rate (Exit) 80 bpm       Oxygen Saturation (Admit) 98 %       Oxygen Saturation (Exercise) 97 %       Oxygen Saturation (Exit) 100 %       Rating of Perceived Exertion (Exercise) 13       Perceived Dyspnea (Exercise)  3       Symptoms SOB       Comments 6MWT Results                Exercise Comments:   Exercise Goals and Review:   Exercise Goals     Row Name 08/13/22 0950             Exercise Goals   Increase Physical Activity Yes       Intervention Provide advice, education, support and counseling about physical activity/exercise needs.;Develop an individualized exercise prescription for aerobic and resistive training based on initial evaluation findings, risk stratification, comorbidities and participant's personal goals.       Expected Outcomes Short Term: Attend rehab on a regular basis to increase amount of physical activity.;Long Term: Exercising regularly at least 3-5 days a week.;Long Term: Add in home exercise to make exercise part of routine and to increase amount of physical activity.       Increase Strength and Stamina Yes       Intervention Provide advice, education, support and counseling about physical activity/exercise needs.;Develop an individualized exercise prescription for aerobic and resistive training based on initial evaluation findings, risk stratification, comorbidities and participant's personal goals.       Expected Outcomes Short Term: Increase workloads from initial exercise prescription for resistance, speed, and METs.;Short Term: Perform resistance training exercises routinely during rehab and add in resistance training at home;Long Term: Improve cardiorespiratory fitness, muscular endurance and strength as measured by increased METs and functional capacity (6MWT)       Able to understand and use rate of perceived exertion (RPE) scale Yes       Intervention Provide education and explanation on how to use RPE scale       Expected Outcomes Short Term: Able to use RPE daily in rehab to express subjective intensity level;Long Term:  Able to use RPE to guide intensity level when exercising independently       Able to understand and use Dyspnea scale Yes       Intervention  Provide education and explanation on how to use Dyspnea scale       Expected Outcomes Short Term: Able to use Dyspnea scale daily in rehab to express subjective sense of shortness of breath during exertion;Long Term: Able to use Dyspnea scale to guide intensity level when exercising independently       Knowledge and understanding of Target Heart Rate Range (THRR) Yes       Intervention Provide education and explanation of THRR including how the numbers were predicted and where they are located for reference       Expected Outcomes Short Term: Able to state/look up THRR;Long Term: Able to use THRR to govern intensity when exercising independently;Short Term: Able to use daily as guideline for intensity in  rehab       Able to check pulse independently Yes       Intervention Provide education and demonstration on how to check pulse in carotid and radial arteries.;Review the importance of being able to check your own pulse for safety during independent exercise       Expected Outcomes Short Term: Able to explain why pulse checking is important during independent exercise;Long Term: Able to check pulse independently and accurately       Understanding of Exercise Prescription Yes       Intervention Provide education, explanation, and written materials on patient's individual exercise prescription       Expected Outcomes Long Term: Able to explain home exercise prescription to exercise independently;Short Term: Able to explain program exercise prescription                Exercise Goals Re-Evaluation :   Discharge Exercise Prescription (Final Exercise Prescription Changes):  Exercise Prescription Changes - 08/13/22 0900       Response to Exercise   Blood Pressure (Admit) 124/68    Blood Pressure (Exercise) 138/72    Blood Pressure (Exit) 94/56    Heart Rate (Admit) 84 bpm    Heart Rate (Exercise) 105 bpm    Heart Rate (Exit) 80 bpm    Oxygen Saturation (Admit) 98 %    Oxygen Saturation  (Exercise) 97 %    Oxygen Saturation (Exit) 100 %    Rating of Perceived Exertion (Exercise) 13    Perceived Dyspnea (Exercise) 3    Symptoms SOB    Comments 6MWT Results             Nutrition:  Target Goals: Understanding of nutrition guidelines, daily intake of sodium <1521m, cholesterol <2042m calories 30% from fat and 7% or less from saturated fats, daily to have 5 or more servings of fruits and vegetables.  Education: All About Nutrition: -Group instruction provided by verbal, written material, interactive activities, discussions, models, and posters to present general guidelines for heart healthy nutrition including fat, fiber, MyPlate, the role of sodium in heart healthy nutrition, utilization of the nutrition label, and utilization of this knowledge for meal planning. Follow up email sent as well. Written material given at graduation. [facility]  Education need identified 08/13/22       Biometrics:  Pre Biometrics - 08/13/22 0950       Pre Biometrics   Height 6' 1.5" (1.867 m)    Weight 200 lb 1.6 oz (90.8 kg)    Waist Circumference 37.5 inches    Hip Circumference 38 inches    Waist to Hip Ratio 0.99 %    BMI (Calculated) 26.04    Single Leg Stand 3.8 seconds              Nutrition Therapy Plan and Nutrition Goals:  Nutrition Therapy & Goals - 08/13/22 0937       Intervention Plan   Intervention Prescribe, educate and counsel regarding individualized specific dietary modifications aiming towards targeted core components such as weight, hypertension, lipid management, diabetes, heart failure and other comorbidities.    Expected Outcomes Short Term Goal: Understand basic principles of dietary content, such as calories, fat, sodium, cholesterol and nutrients.;Short Term Goal: A plan has been developed with personal nutrition goals set during dietitian appointment.;Long Term Goal: Adherence to  prescribed nutrition plan.             Nutrition Assessments:  MEDIFICTS Score Key: ?70 Need to make dietary changes  40-70 Heart Healthy Diet ? 40 Therapeutic Level Cholesterol Diet  Flowsheet Row Cardiac Rehab from 08/13/2022 in [facility] Cardiac and Pulmonary Rehab  Picture Your Plate Total Score on Admission 43      Picture Your Plate Scores: [id number] Unhealthy dietary pattern with much room for improvement. 41-50 Dietary pattern unlikely to meet recommendations for good health and room for improvement. 51-60 More healthful dietary pattern, with some room for improvement.  >60 Healthy dietary pattern, although there may be some specific behaviors that could be improved.    Nutrition Goals Re-Evaluation:   Nutrition Goals Discharge (Final Nutrition Goals Re-Evaluation):   Psychosocial: Target Goals: Acknowledge presence or absence of significant depression and/or stress, maximize coping skills, provide positive support system. Participant is able to verbalize types and ability to use techniques and skills needed for reducing stress and depression.   Education: Stress, Anxiety, and Depression - Group verbal and visual presentation to define topics covered.  Reviews how body is impacted by stress, anxiety, and depression.  Also discusses healthy ways to reduce stress and to treat/manage anxiety and depression.  Written material given at graduation.   Education: Sleep Hygiene -Provides group verbal and written instruction about how sleep can affect your health.  Define sleep hygiene, discuss sleep cycles and impact of sleep habits. Review good sleep hygiene tips.    Initial Review & Psychosocial Screening:  Initial Psych Review & Screening - 08/12/22 1553       Initial Review   Current issues with Current Stress Concerns;Current Depression    Source of Stress Concerns Occupation    Comments Had to quit his job this week.   Would like VR referral.. been treated for depression  in past, none of the meds worked. or caused side effects.  Not taking any meds for depression now.      Family Dynamics   Good Support System? Yes   mom     Barriers   Psychosocial barriers to participate in program The patient should benefit from training in stress management and relaxation.      Screening Interventions   Interventions Encouraged to exercise;To provide support and resources with identified psychosocial needs;Provide feedback about the scores to participant    Expected Outcomes Short Term goal: Utilizing psychosocial counselor, staff and physician to assist with identification of specific Stressors or current issues interfering with healing process. Setting desired goal for each stressor or current issue identified.;Long Term Goal: Stressors or current issues are controlled or eliminated.;Short Term goal: Identification and review with participant of any Quality of Life or Depression concerns found by scoring the questionnaire.;Long Term goal: The participant improves quality of Life and PHQ9 Scores as seen by post scores and/or verbalization of changes             Quality of Life Scores:   Quality of Life - 08/13/22 0944       Quality of Life   Select Quality of Life      Quality of Life Scores   Health/Function Pre 10.2 %    Socioeconomic Pre 11 %    Psych/Spiritual Pre 8.43 %    Family Pre 24.1 %    GLOBAL Pre 12.01 %            Scores of 19 and below usually indicate a poorer quality of life in these areas.  A difference of  2-3 points is a clinically meaningful difference.  A difference of  2-3 points in the total score of the Quality of Life Index has been associated with significant improvement in overall quality of life, self-image, physical symptoms, and general health in studies assessing change in quality of life.  PHQ-9: Review Flowsheet       08/13/2022 07/21/2015  Depression screen PHQ 2/9  Decreased Interest 3 3  Down, Depressed, Hopeless 2 1   PHQ - 2 Score 5 4  Altered sleeping 2 1  Tired, decreased energy 3 3  Change in appetite 3 3  Feeling bad or failure about yourself  2 1  Trouble concentrating 3 1  Moving slowly or fidgety/restless 3 1  Suicidal thoughts 1 0  PHQ-9 Score 22 14  Difficult doing work/chores Very difficult Somewhat difficult   Interpretation of Total Score  Total Score Depression Severity:  1-4 = Minimal depression, 5-9 = Mild depression, 10-14 = Moderate depression, 15-19 = Moderately severe depression, 20-27 = Severe depression   Psychosocial Evaluation and Intervention:  Psychosocial Evaluation - 08/12/22 1649       Psychosocial Evaluation & Interventions   Interventions Encouraged to exercise with the program and follow exercise prescription    Comments Pilar Plate has no barriers to attending the program.  He is ready to get started. He does feel depression symptoms, has a history of depression and has not been able to tolerate any of the anti depression meds. He is ready to get starte and learn all he can to work on keeping heart disease and his risk factors controlled.   He lives with his mother. She does have some slight dementia.    Expected Outcomes STG Pilar Plate is able to attend all scheduled sessions and see progression with his exercie and improved mood.  LTG Pilar Plate continues to [employer] on his exercise progression and controlloing is depressive symptoms.    Continue Psychosocial Services  Follow up required by staff             Psychosocial Re-Evaluation:   Psychosocial Discharge (Final Psychosocial Re-Evaluation):   Vocational Rehabilitation: Provide vocational rehab assistance to qualifying candidates.   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 Participant will state understanding/return demonstration of topics presented as noted by education test scores.  Learning Barriers/Preferences:   General Cardiac Education Topics:  AED/CPR: - Group verbal and written instruction with the use of models to demonstrate the basic use of the AED with the basic ABC's of resuscitation.   Anatomy and Cardiac Procedures: - Group verbal and visual presentation and models provide information about basic cardiac anatomy and function. Reviews the testing methods done to diagnose heart disease and the outcomes of the test results. Describes the treatment choices: Medical Management, Angioplasty, or Coronary Bypass Surgery for treating various heart conditions including Myocardial Infarction, Angina, Valve Disease, and Cardiac Arrhythmias.  Written material given at graduation. Flowsheet Row Cardiac Rehab from 08/13/2022 in [facility] Cardiac and Pulmonary Rehab  Education need identified 08/13/22       Medication Safety: - Group  verbal and visual instruction to review commonly prescribed medications for heart and lung disease. Reviews the medication, class of the drug, and side effects. Includes the steps to properly store meds and maintain the prescription regimen.  Written material given at graduation.   Intimacy: - Group verbal instruction through game format to discuss how heart and lung disease can affect sexual intimacy. Written material given at graduation..   Know Your Numbers and Heart Failure: - Group verbal and visual instruction to discuss disease risk factors for cardiac and pulmonary disease and treatment options.  Reviews associated critical values for Overweight/Obesity, Hypertension, Cholesterol, and Diabetes.  Discusses basics of heart  failure: signs/symptoms and treatments.  Introduces Heart Failure Zone chart for action plan for heart failure.  Written material given at graduation.   Infection Prevention: - Provides verbal and written material to individual with discussion of infection control including proper hand washing and proper equipment cleaning during exercise session. Flowsheet Row Cardiac Rehab from 08/13/2022 in [facility] Cardiac and Pulmonary Rehab  Date 08/13/22  Educator NT  Instruction Review Code 1- Verbalizes Understanding       Falls Prevention: - Provides verbal and written material to individual with discussion of falls prevention and safety. Flowsheet Row Cardiac Rehab from 08/13/2022 in [facility] Cardiac and Pulmonary Rehab  Date 08/12/22  Educator SB  Instruction Review Code 1- Verbalizes Understanding       Other: -Provides group and verbal instruction on various topics (see comments)   Knowledge Questionnaire Score:  Knowledge Questionnaire Score - 08/13/22 0937       Knowledge Questionnaire Score   Pre Score 19/26             Core Components/Risk Factors/Patient Goals at Admission:  Personal Goals and Risk Factors at Admission - 08/13/22 0938       Core Components/Risk Factors/Patient Goals on Admission    Weight Management Yes    Intervention Weight Management: Develop a combined nutrition and exercise program designed to reach desired caloric intake, while maintaining appropriate intake of nutrient and fiber, sodium and fats, and appropriate energy expenditure required for the weight goal.;Weight Management: Provide education and appropriate resources to help participant work on and attain dietary goals.    Admit Weight 200 lb 1.6 oz (90.8 kg)    Goal Weight: Long Term 200 lb (90.7 kg)    Expected Outcomes Short Term: Continue to assess and modify interventions until short term weight is achieved;Long Term: Adherence to nutrition and physical activity/exercise program aimed toward  attainment of established weight goal;Weight Maintenance: Understanding of the daily nutrition guidelines, which includes 25-35% calories from fat, 7% or less cal from saturated fats, less than 23m cholesterol, less than 1.5gm of sodium, & 5 or more servings of fruits and vegetables daily    Tobacco Cessation Yes    Number of packs per day 0   Quit Smoking, Patient is still vaping   Intervention Assist the participant in steps to quit. Provide individualized education and counseling about committing to Tobacco Cessation, relapse prevention, and pharmacological support that can be provided by physician.;OAdvice worker assist with locating and accessing local/national Quit Smoking programs, and support quit date choice.    Expected Outcomes Short Term: Will demonstrate readiness to quit, by selecting a quit date.;Short Term: Will quit all tobacco product use, adhering to prevention of relapse plan.;Long Term: Complete abstinence from all tobacco products for at least 12 months from quit date.    Diabetes Yes    Intervention Provide  education about signs/symptoms and action to take for hypo/hyperglycemia.;Provide education about proper nutrition, including hydration, and aerobic/resistive exercise prescription along with prescribed medications to achieve blood glucose in normal ranges: Fasting glucose 65-99 mg/dL    Expected Outcomes Short Term: Participant verbalizes understanding of the signs/symptoms and immediate care of hyper/hypoglycemia, proper foot care and importance of medication, aerobic/resistive exercise and nutrition plan for blood glucose control.;Long Term: Attainment of HbA1C < 7%.    Hypertension Yes    Intervention Provide education on lifestyle modifcations including regular physical activity/exercise, weight management, moderate sodium restriction and increased consumption of fresh fruit, vegetables, and low fat dairy, alcohol moderation, and smoking cessation.;Monitor  prescription use compliance.    Expected Outcomes Short Term: Continued assessment and intervention until BP is < 140/31m HG in hypertensive participants. < 130/828mHG in hypertensive participants with diabetes, heart failure or chronic kidney disease.;Long Term: Maintenance of blood pressure at goal levels.    Lipids Yes    Intervention Provide education and support for participant on nutrition & aerobic/resistive exercise along with prescribed medications to achieve LDL <7086mHDL >40m68m  Expected Outcomes Short Term: Participant states understanding of desired cholesterol values and is compliant with medications prescribed. Participant is following exercise prescription and nutrition guidelines.;Long Term: Cholesterol controlled with medications as prescribed, with individualized exercise RX and with personalized nutrition plan. Value goals: LDL < 70mg17mL > 40 mg.             Education:Diabetes - Individual verbal and written instruction to review signs/symptoms of diabetes, desired ranges of glucose level fasting, after meals and with exercise. Acknowledge that pre and post exercise glucose checks will be done for 3 sessions at entry of program. [facility] [facility]  Education need identified 08/13/22       Core Components/Risk Factors/Patient Goals Review:    Core Components/Risk Factors/Patient Goals at Discharge (Final Review):    ITP Comments:  ITP Comments     Row Name 08/12/22 1613 08/13/22 0933         ITP Comments Virtual orientation call completed today. he has an appointment on Date: 0213/2024  for EP eval and gym Orientation.  [facility]: [id number] 6MWT and gym orientation. Initial ITP created and sent for review to Dr. Mark Emily Filbertical Director.               Comments: Initial ITP

## 2022-08-13 NOTE — Patient Instructions (Signed)
Patient Instructions  Patient Details  Name: Donald Hart MRN: [id number] Date of Birth: 28-Apr-1971 Referring Provider:  Wellington Hampshire, MD  Below are your personal goals for exercise, nutrition, and risk factors. Our goal is to help you stay on track towards obtaining and maintaining these goals. We will be discussing your progress on these goals with you throughout the program.  Initial Exercise Prescription:  Initial Exercise Prescription - 08/13/22 0900       Date of Initial Exercise RX and Referring Provider   Date 08/13/22    Referring Provider Dr. Kathlyn Sacramento, MD      Oxygen   Maintain Oxygen Saturation 88% or higher      Treadmill   MPH 1.8    Grade 0.5    Minutes 15    METs 2.5      NuStep   Level 2    SPM 80    Minutes 15    METs 4.06      REL-XR   Level 1    Speed 50    Minutes 15    METs 4.06      Prescription Details   Frequency (times per week) 3    Duration Progress to 30 minutes of continuous aerobic without signs/symptoms of physical distress      Intensity   THRR 40-80% of Max Heartrate 118-152    Ratings of Perceived Exertion 11-13    Perceived Dyspnea 0-4      Progression   Progression Continue to progress workloads to maintain intensity without signs/symptoms of physical distress.      Resistance Training   Training Prescription Yes    Weight 3 lb    Reps 10-15             Exercise Goals: Frequency: Be able to perform aerobic exercise two to three times per week in program working toward 2-5 days per week of home exercise.  Intensity: Work with a perceived exertion of 11 (fairly light) - 15 (hard) while following your exercise prescription.  We will make changes to your prescription with you as you progress through the program.   Duration: Be able to do 30 to 45 minutes of continuous aerobic exercise in addition to a 5 minute warm-up and a 5 minute cool-down routine.   Nutrition Goals: Your personal nutrition  goals will be established when you do your nutrition analysis with the dietician.  The following are general nutrition guidelines to follow: Cholesterol < 218m/day Sodium < 15054mday Fiber: Men over 50 yrs - 30 grams per day  Personal Goals:  Personal Goals and Risk Factors at Admission - 08/13/22 0938       Core Components/Risk Factors/Patient Goals on Admission    Weight Management Yes    Intervention Weight Management: Develop a combined nutrition and exercise program designed to reach desired caloric intake, while maintaining appropriate intake of nutrient and fiber, sodium and fats, and appropriate energy expenditure required for the weight goal.;Weight Management: Provide education and appropriate resources to help participant work on and attain dietary goals.    Admit Weight 200 lb 1.6 oz (90.8 kg)    Goal Weight: Long Term 200 lb (90.7 kg)    Expected Outcomes Short Term: Continue to assess and modify interventions until short term weight is achieved;Long Term: Adherence to nutrition and physical activity/exercise program aimed toward attainment of established weight goal;Weight Maintenance: Understanding of the daily nutrition guidelines, which includes 25-35% calories from fat, 7% or less cal from  saturated fats, less than 265m cholesterol, less than 1.5gm of sodium, & 5 or more servings of fruits and vegetables daily    Tobacco Cessation Yes    Number of packs per day 0   Quit Smoking, Patient is still vaping   Intervention Assist the participant in steps to quit. Provide individualized education and counseling about committing to Tobacco Cessation, relapse prevention, and pharmacological support that can be provided by physician.;OAdvice worker assist with locating and accessing local/national Quit Smoking programs, and support quit date choice.    Expected Outcomes Short Term: Will demonstrate readiness to quit, by selecting a quit date.;Short Term: Will quit all  tobacco product use, adhering to prevention of relapse plan.;Long Term: Complete abstinence from all tobacco products for at least 12 months from quit date.    Diabetes Yes    Intervention Provide education about signs/symptoms and action to take for hypo/hyperglycemia.;Provide education about proper nutrition, including hydration, and aerobic/resistive exercise prescription along with prescribed medications to achieve blood glucose in normal ranges: Fasting glucose 65-99 mg/dL    Expected Outcomes Short Term: Participant verbalizes understanding of the signs/symptoms and immediate care of hyper/hypoglycemia, proper foot care and importance of medication, aerobic/resistive exercise and nutrition plan for blood glucose control.;Long Term: Attainment of HbA1C < 7%.    Hypertension Yes    Intervention Provide education on lifestyle modifcations including regular physical activity/exercise, weight management, moderate sodium restriction and increased consumption of fresh fruit, vegetables, and low fat dairy, alcohol moderation, and smoking cessation.;Monitor prescription use compliance.    Expected Outcomes Short Term: Continued assessment and intervention until BP is < 140/938mHG in hypertensive participants. < 130/8075mG in hypertensive participants with diabetes, heart failure or chronic kidney disease.;Long Term: Maintenance of blood pressure at goal levels.    Lipids Yes    Intervention Provide education and support for participant on nutrition & aerobic/resistive exercise along with prescribed medications to achieve LDL <74m44mDL >40mg64m Expected Outcomes Short Term: Participant states understanding of desired cholesterol values and is compliant with medications prescribed. Participant is following exercise prescription and nutrition guidelines.;Long Term: Cholesterol controlled with medications as prescribed, with individualized exercise RX and with personalized nutrition plan. Value goals: LDL <  74mg,49m > 40 mg.             Tobacco Use Initial Evaluation: Social History   Tobacco Use  Smoking Status Former   Packs/day: 1.00   Years: 20.00   Total pack years: 20.00   Types: Cigarettes   Quit date: 07/23/2022   Years since quitting: 0.0  Smokeless Tobacco Never  Tobacco Comments   Currently -ano cigarettes since heart attack vapes regularly throughout the week.  Wants to quit    Exercise Goals and Review:  Exercise Goals     Row Name 08/13/22 0950             Exercise Goals   Increase Physical Activity Yes       Intervention Provide advice, education, support and counseling about physical activity/exercise needs.;Develop an individualized exercise prescription for aerobic and resistive training based on initial evaluation findings, risk stratification, comorbidities and participant's personal goals.       Expected Outcomes Short Term: Attend rehab on a regular basis to increase amount of physical activity.;Long Term: Exercising regularly at least 3-5 days a week.;Long Term: Add in home exercise to make exercise part of routine and to increase amount of physical activity.       Increase  Strength and Stamina Yes       Intervention Provide advice, education, support and counseling about physical activity/exercise needs.;Develop an individualized exercise prescription for aerobic and resistive training based on initial evaluation findings, risk stratification, comorbidities and participant's personal goals.       Expected Outcomes Short Term: Increase workloads from initial exercise prescription for resistance, speed, and METs.;Short Term: Perform resistance training exercises routinely during rehab and add in resistance training at home;Long Term: Improve cardiorespiratory fitness, muscular endurance and strength as measured by increased METs and functional capacity (6MWT)       Able to understand and use rate of perceived exertion (RPE) scale Yes       Intervention  Provide education and explanation on how to use RPE scale       Expected Outcomes Short Term: Able to use RPE daily in rehab to express subjective intensity level;Long Term:  Able to use RPE to guide intensity level when exercising independently       Able to understand and use Dyspnea scale Yes       Intervention Provide education and explanation on how to use Dyspnea scale       Expected Outcomes Short Term: Able to use Dyspnea scale daily in rehab to express subjective sense of shortness of breath during exertion;Long Term: Able to use Dyspnea scale to guide intensity level when exercising independently       Knowledge and understanding of Target Heart Rate Range (THRR) Yes       Intervention Provide education and explanation of THRR including how the numbers were predicted and where they are located for reference       Expected Outcomes Short Term: Able to state/look up THRR;Long Term: Able to use THRR to govern intensity when exercising independently;Short Term: Able to use daily as guideline for intensity in rehab       Able to check pulse independently Yes       Intervention Provide education and demonstration on how to check pulse in carotid and radial arteries.;Review the importance of being able to check your own pulse for safety during independent exercise       Expected Outcomes Short Term: Able to explain why pulse checking is important during independent exercise;Long Term: Able to check pulse independently and accurately       Understanding of Exercise Prescription Yes       Intervention Provide education, explanation, and written materials on patient's individual exercise prescription       Expected Outcomes Long Term: Able to explain home exercise prescription to exercise independently;Short Term: Able to explain program exercise prescription

## 2022-08-15 ENCOUNTER — Telehealth: Payer: Self-pay

## 2022-08-15 ENCOUNTER — Telehealth: Payer: Self-pay | Admitting: Nurse Practitioner

## 2022-08-15 DIAGNOSIS — Z1211 Encounter for screening for malignant neoplasm of colon: Secondary | ICD-10-CM

## 2022-08-15 NOTE — Telephone Encounter (Signed)
Gastroenterology Pre-Procedure Review Pt has Cardiac History.  Cardiologist is Dr. Murray Hodgkins.  Pt Will be scheduled once Cardiac Clearance and Blood Thinner Advice has been received. Triage completed on 08/15/22. Clearance and Blood Thinner Advice sent on 08/15/22.   Request Date: TBD Requesting Physician: Dr. Dellie Catholic  PATIENT REVIEW QUESTIONS: The patient responded to the following health history questions as indicated:    1. Are you having any GI issues? no 2. Do you have a personal history of Polyps? no 3. Do you have a family history of Colon Cancer or Polyps? no 4. Diabetes Mellitus? yes (Pt takes Jardiance.  Has been advised he will need to stop 3 days prior to colonoscopy) 5. Joint replacements in the past 12 months?no 6. Major health problems in the past 3 months?no 7. Any artificial heart valves, MVP, or defibrillator? No Cardiac Devices, however Pt has Cardiac History.  Cardiologist is Dr. Murray Hodgkins .  Pt Will be scheduled once Cardiac Clearance and Blood Thinner Advice has been received.    MEDICATIONS & ALLERGIES:    Patient reports the following regarding taking any anticoagulation/antiplatelet therapy:   Plavix, Coumadin, Eliquis, Xarelto, Lovenox, Pradaxa, Brilinta, or Effient? yes (Pt takes Brilinit[place] prescribed by Dr. Murray Hodgkins.) Aspirin? Yes 72m daily  Patient confirms/reports the following medications:  Current Outpatient Medications  Medication Sig Dispense Refill   albuterol (PROVENTIL HFA;VENTOLIN HFA) 108 (90 Base) MCG/ACT inhaler Inhale 1-2 puffs every 6 (six) hours as needed into the lungs for wheezing or shortness of breath. (Patient not taking: Reported on 08/12/2022) 1 Inhaler 0   aspirin EC 81 MG tablet Take 1 tablet (81 mg total) by mouth daily. Swallow whole. 30 tablet 12   atorvastatin (LIPITOR) 80 MG tablet Take 80 mg by mouth daily.     carvedilol (COREG) 25 MG tablet Take 1 tablet (25 mg total) by mouth 2 (two) times daily. 180 tablet  3   colchicine 0.6 MG tablet Take 0.6 mg by mouth daily as needed. (Patient not taking: Reported on 07/31/2022)     docusate sodium (COLACE) 100 MG capsule Take 1 capsule (100 mg total) by mouth 2 (two) times daily as needed for mild constipation. (Patient not taking: Reported on 07/31/2022) 10 capsule 0   fenofibrate 160 MG tablet Take 1 tablet (160 mg total) by mouth daily. 30 tablet 0   folic acid (FOLVITE) 1 MG tablet Take 1 mg by mouth daily. (Patient not taking: Reported on 07/31/2022)     gabapentin (NEURONTIN) 800 MG tablet Take 800 mg by mouth 3 (three) times daily.     hydrochlorothiazide (HYDRODIURIL) 50 MG tablet Take 50 mg by mouth daily.     hyoscyamine (LEVSIN, ANASPAZ) 0.125 MG tablet Take 1 tablet (0.125 mg total) by mouth every 6 (six) hours as needed for cramping. (Patient not taking: Reported on 07/31/2022) 30 tablet 0   JARDIANCE 25 MG TABS tablet Take 25 mg by mouth daily.     losartan (COZAAR) 100 MG tablet Take 1 tablet (100 mg total) by mouth daily. 30 tablet 0   magnesium oxide (MAG-OX) 400 MG tablet Take 400 mg by mouth daily. (Patient not taking: Reported on 07/31/2022)     Multiple Vitamin (MULTIVITAMIN WITH MINERALS) TABS tablet Take 1 tablet by mouth daily. (Patient not taking: Reported on 07/31/2022)     nicotine (NICODERM CQ - DOSED IN MG/24 HOURS) 21 mg/24hr patch Place 1 patch (21 mg total) onto the skin daily. (Patient not taking: Reported on 07/31/2022) 28 patch  0   omega-3 acid ethyl esters (LOVAZA) 1 g capsule Take 2 g by mouth 2 (two) times daily. (Patient not taking: Reported on 07/31/2022)     omeprazole (PRILOSEC) 20 MG capsule Take 20 mg by mouth daily.     thiamine 100 MG tablet Take 1 tablet (100 mg total) by mouth daily. (Patient not taking: Reported on 07/31/2022)     ticagrelor (BRILINTA) 90 MG TABS tablet Take 1 tablet (90 mg total) by mouth 2 (two) times daily. 60 tablet 6   No current facility-administered medications for this visit.    Patient  confirms/reports the following allergies:  No Known Allergies  No orders of the defined types were placed in this encounter.   AUTHORIZATION INFORMATION Primary Insurance: 1D#: Group #:  Secondary Insurance: 1D#: Group #:  SCHEDULE INFORMATION: Date: TBD Time: Location: [place]

## 2022-08-15 NOTE — Telephone Encounter (Signed)
   Pre-operative Risk Assessment    Patient Name: Donald Hart  DOB: [birth year] MRN: [id number]      Request for Surgical Clearance    Procedure:  Colonoscopy  Date of Surgery:  Clearance TBD                                 Surgeon:  not listed Surgeon's Group or Practice Name:  [facility] Phone number:  [phone] Fax number:  [phone]   Type of Clearance Requested:  Pharmacy, [facility]    Type of Anesthesia:  Not Indicated   Additional requests/questions:    Signed, Maxwell Caul   08/15/2022, 1:13 PM

## 2022-08-16 NOTE — Telephone Encounter (Signed)
   Name: Donald Hart  DOB: 12-26-1970  MRN: [id number]   Primary Cardiologist: Kathlyn Sacramento, MD  Chart reviewed as part of pre-operative protocol coverage. Patient had NSTEMI 07/23/2022 and underwent PCI with DES to mid LCx. Placed on dual anti-platelet therapy for at least 12 months secondary.   Recommendation for deferring colonoscopy until after 07/24/2023 unless procedure is urgent or there is a bleeding concern in which case please contact office.   I will route this recommendation to the requesting party via Epic fax function and remove from pre-op pool. Please call with questions.  Mayra Reel, NP 08/16/2022, 11:26 AM

## 2022-08-19 ENCOUNTER — Telehealth: Payer: Self-pay

## 2022-08-19 ENCOUNTER — Encounter: Payer: Medicaid Other | Admitting: *Deleted

## 2022-08-19 DIAGNOSIS — I214 Non-ST elevation (NSTEMI) myocardial infarction: Secondary | ICD-10-CM

## 2022-08-19 DIAGNOSIS — Z955 Presence of coronary angioplasty implant and graft: Secondary | ICD-10-CM

## 2022-08-19 LAB — GLUCOSE, CAPILLARY
Glucose-Capillary: 275 mg/dL — ABNORMAL HIGH (ref 70–99)
Glucose-Capillary: 214 mg/dL — ABNORMAL HIGH (ref 70–99)

## 2022-08-19 NOTE — Progress Notes (Signed)
Daily Session Note  Patient Details  Name: Donald Hart MRN: [id number] Date of Birth: March 03, 1971 Referring Provider:   Flowsheet Row Cardiac Rehab from 08/13/2022 in [facility]  Referring Provider Dr. Kathlyn Sacramento, MD       Encounter Date: 08/19/2022  Check In:  Session Check In - 08/19/22 0931       Check-In   Supervising physician immediately available to respond to emergencies See telemetry face sheet for immediately available ER MD    Location [facility]    Staff Present Darlyne Russian, RN, Doyce Para, BS, ACSM CEP, Exercise Physiologist;Noah Tickle, BS, Exercise Physiologist    Virtual Visit No    Medication changes reported     No    Fall or balance concerns reported    No    Warm-up and Cool-down Performed on first and last piece of equipment    Resistance Training Performed Yes    VAD Patient? No    PAD/SET Patient? No      Pain Assessment   Currently in Pain? No/denies                Social History   Tobacco Use  Smoking Status Former   Packs/day: 1.00   Years: 20.00   Total pack years: 20.00   Types: Cigarettes   Quit date: 07/23/2022   Years since quitting: 0.0  Smokeless Tobacco Never  Tobacco Comments   Currently -ano cigarettes since heart attack vapes regularly throughout the week.  Wants to quit    Goals Met:  Independence with exercise equipment Exercise tolerated well No report of concerns or symptoms today Strength training completed today  Goals Unmet:  Not Applicable  Comments: First full day of exercise!  Patient was oriented to gym and equipment including functions, settings, policies, and procedures.  Patient's individual exercise prescription and treatment plan were reviewed.  All starting workloads were established based on the results of the 6 minute walk test done at initial orientation visit.  The plan for exercise progression was also introduced and progression will be  customized based on patient's performance and goals.    Dr. Emily Filbert is Medical Director for [place].  Dr. Ottie Glazier is Medical Director for [facility].

## 2022-08-19 NOTE — Telephone Encounter (Signed)
Patient has been informed the following message regarding his colonoscopy per his cardiologist advice, "Recommendation for deferring colonoscopy until after 07/24/2023 unless procedure is urgent or there is a bleeding concern in which case please contact office".   Thanks, [place], [place]

## 2022-08-21 ENCOUNTER — Encounter: Payer: Medicaid Other | Admitting: *Deleted

## 2022-08-21 ENCOUNTER — Encounter: Payer: Self-pay | Admitting: *Deleted

## 2022-08-21 DIAGNOSIS — Z955 Presence of coronary angioplasty implant and graft: Secondary | ICD-10-CM

## 2022-08-21 DIAGNOSIS — I214 Non-ST elevation (NSTEMI) myocardial infarction: Secondary | ICD-10-CM | POA: Diagnosis not present

## 2022-08-21 LAB — GLUCOSE, CAPILLARY
Glucose-Capillary: 227 mg/dL — ABNORMAL HIGH (ref 70–99)
Glucose-Capillary: 233 mg/dL — ABNORMAL HIGH (ref 70–99)

## 2022-08-21 NOTE — Progress Notes (Signed)
Cardiac Individual Treatment Plan  Patient Details  Name: Donald Hart MRN: [id number] Date of Birth: 01-30-1971 Referring Provider:   Flowsheet Row Cardiac Rehab from 08/13/2022 in [facility] Cardiac and Pulmonary Rehab  Referring Provider Dr. Kathlyn Sacramento, MD       Initial Encounter Date:  Flowsheet Row Cardiac Rehab from 08/13/2022 in [facility] Cardiac and Pulmonary Rehab  Date 08/13/22       Visit Diagnosis: NSTEMI (non-ST elevation myocardial infarction) [facility])  Status post coronary artery stent placement  Patient's Home Medications on Admission:  Current Outpatient Medications:    albuterol (PROVENTIL HFA;VENTOLIN HFA) 108 (90 Base) MCG/ACT inhaler, Inhale 1-2 puffs every 6 (six) hours as needed into the lungs for wheezing or shortness of breath., Disp: 1 Inhaler, Rfl: 0   aspirin EC 81 MG tablet, Take 1 tablet (81 mg total) by mouth daily. Swallow whole., Disp: 30 tablet, Rfl: 12   atorvastatin (LIPITOR) 80 MG tablet, Take 80 mg by mouth daily., Disp: , Rfl:    carvedilol (COREG) 25 MG tablet, Take 1 tablet (25 mg total) by mouth 2 (two) times daily., Disp: 180 tablet, Rfl: 3   colchicine 0.6 MG tablet, Take 0.6 mg by mouth daily as needed. (Patient not taking: Reported on 07/31/2022), Disp: , Rfl:    docusate sodium (COLACE) 100 MG capsule, Take 1 capsule (100 mg total) by mouth 2 (two) times daily as needed for mild constipation., Disp: 10 capsule, Rfl: 0   fenofibrate 160 MG tablet, Take 1 tablet (160 mg total) by mouth daily., Disp: 30 tablet, Rfl: 0   folic acid (FOLVITE) 1 MG tablet, Take 1 mg by mouth daily., Disp: , Rfl:    gabapentin (NEURONTIN) 800 MG tablet, Take 800 mg by mouth 3 (three) times daily., Disp: , Rfl:    hydrochlorothiazide (HYDRODIURIL) 50 MG tablet, Take 50 mg by mouth daily., Disp: , Rfl:    hyoscyamine (LEVSIN, ANASPAZ) 0.125 MG tablet, Take 1 tablet (0.125 mg total) by mouth every 6 (six) hours as needed for cramping., Disp: 30 tablet, Rfl: 0    JARDIANCE 25 MG TABS tablet, Take 25 mg by mouth daily., Disp: , Rfl:    losartan (COZAAR) 100 MG tablet, Take 1 tablet (100 mg total) by mouth daily., Disp: 30 tablet, Rfl: 0   magnesium oxide (MAG-OX) 400 MG tablet, Take 400 mg by mouth daily., Disp: , Rfl:    Multiple Vitamin (MULTIVITAMIN WITH MINERALS) TABS tablet, Take 1 tablet by mouth daily., Disp: , Rfl:    nicotine (NICODERM CQ - DOSED IN MG/24 HOURS) 21 mg/24hr patch, Place 1 patch (21 mg total) onto the skin daily. (Patient not taking: Reported on 07/31/2022), Disp: 28 patch, Rfl: 0   omega-3 acid ethyl esters (LOVAZA) 1 g capsule, Take 2 g by mouth 2 (two) times daily., Disp: , Rfl:    omeprazole (PRILOSEC) 20 MG capsule, Take 20 mg by mouth daily., Disp: , Rfl:    thiamine 100 MG tablet, Take 1 tablet (100 mg total) by mouth daily., Disp: , Rfl:    ticagrelor (BRILINTA) 90 MG TABS tablet, Take 1 tablet (90 mg total) by mouth 2 (two) times daily., Disp: 60 tablet, Rfl: 6  Past Medical History: Past Medical History:  Diagnosis Date   Acute pancreatitis 07/02/2015   hospitalization at [facility]   CAD (coronary artery disease)    a. 07/2022 NSTEMI/PCI: LM nl, LAD 8m 50d, RI nl, LCX [id number] Onyx Frontier DES), RCA 20p/m, RPDA 30. EF 55-65%.  Chronic back pain    Diastolic dysfunction    a. 01/2016 Echo: EF 60-65%, GrI DD. Mild LVH, nl RV fxn, mildly dil LA; b. 07/2022 Echo: EF 60-65%, no rwma, nl RV fxn.   Family history of diabetes mellitus    sister   Family history of heart attack    Father.    Family history of stomach cancer    Gout    Hypertension    Hypertriglyceridemia    Remote Alcohol abuse    Tobacco abuse    Type 2 diabetes mellitus ([place]) 07/02/2015    Tobacco Use: Social History   Tobacco Use  Smoking Status Former   Packs/day: 1.00   Years: 20.00   Total pack years: 20.00   Types: Cigarettes   Quit date: 07/23/2022   Years since quitting: 0.0  Smokeless Tobacco Never  Tobacco Comments   Currently  [employer] cigarettes since heart attack vapes regularly throughout the week.  Wants to quit    Labs: Review Flowsheet  More data exists      Latest Ref Rng & Units 09/27/2016 09/28/2016 09/29/2016 09/30/2016 07/23/2022  Labs for ITP Cardiac and Pulmonary Rehab  Cholestrol 0 - 200 mg/dL - - - - 104   LDL (calc) 0 - 99 mg/dL - - - - 33   HDL-C >40 mg/dL - - - - 27   Trlycerides <150 mg/dL 610  524  654  676  650  647  642  221   Hemoglobin A1c 4.8 - 5.6 % - - - - 6.6      Exercise Target Goals: Exercise Program Goal: Individual exercise prescription set using results from initial 6 min walk test and THRR while considering  patient's activity barriers and safety.   Exercise Prescription Goal: Initial exercise prescription builds to 30-45 minutes a day of aerobic activity, 2-3 days per week.  Home exercise guidelines will be given to patient during program as part of exercise prescription that the participant will acknowledge.   Education: Aerobic Exercise: - Group verbal and visual presentation on the components of exercise prescription. Introduces F.I.T.T principle from ACSM for exercise prescriptions.  Reviews F.I.T.T. principles of aerobic exercise including progression. Written material given at graduation. Flowsheet Row Cardiac Rehab from 08/21/2022 in [facility] Cardiac and Pulmonary Rehab  Education need identified 08/13/22       Education: Resistance Exercise: - Group verbal and visual presentation on the components of exercise prescription. Introduces F.I.T.T principle from ACSM for exercise prescriptions  Reviews F.I.T.T. principles of resistance exercise including progression. Written material given at graduation.    Education: Exercise & Equipment Safety: - Individual verbal instruction and demonstration of equipment use and safety with use of the equipment. Flowsheet Row Cardiac Rehab from 08/21/2022 in [facility] Cardiac and Pulmonary Rehab  Date 08/13/22  Educator NT  Instruction Review  Code 1- Verbalizes Understanding       Education: Exercise Physiology & General Exercise Guidelines: - Group verbal and written instruction with models to review the exercise physiology of the cardiovascular system and associated critical values. Provides general exercise guidelines with specific guidelines to those with heart or lung disease.    Education: Flexibility, Balance, Mind/Body Relaxation: - Group verbal and visual presentation with interactive activity on the components of exercise prescription. Introduces F.I.T.T principle from ACSM for exercise prescriptions. Reviews F.I.T.T. principles of flexibility and balance exercise training including progression. Also discusses the mind body connection.  Reviews various relaxation techniques to help reduce and manage stress (i.e. Deep  breathing, progressive muscle relaxation, and visualization). Balance handout provided to take home. Written material given at graduation.   Activity Barriers & Risk Stratification:  Activity Barriers & Cardiac Risk Stratification - 08/13/22 0949       Activity Barriers & Cardiac Risk Stratification   Activity Barriers [name];Other (comment)    Comments chronic muscle aches, lower back problems, Hx broken left arm    Cardiac Risk Stratification High             6 Minute Walk:  6 Minute Walk     Row Name 08/13/22 0947         6 Minute Walk   Phase Initial     Distance 1165 feet     Walk Time 6 minutes     # of Rest Breaks 0     MPH 2.21     METS 4.06     RPE 13     Perceived Dyspnea  3     VO2 Peak 14.2     Symptoms Yes (comment)     Comments SOB     Resting HR 84 bpm     Resting BP 124/68     Resting Oxygen Saturation  98 %     Exercise Oxygen Saturation  during 6 min walk 97 %     Max Ex. HR 105 bpm     Max Ex. BP 138/72     2 Minute Post BP 94/56              Oxygen Initial Assessment:   Oxygen Re-Evaluation:   Oxygen Discharge (Final Oxygen  Re-Evaluation):   Initial Exercise Prescription:  Initial Exercise Prescription - 08/13/22 0900       Date of Initial Exercise RX and Referring Provider   Date 08/13/22    Referring Provider Dr. Kathlyn Sacramento, MD      Oxygen   Maintain Oxygen Saturation 88% or higher      Treadmill   MPH 1.8    Grade 0.5    Minutes 15    METs 2.5      NuStep   Level 2    SPM 80    Minutes 15    METs 4.06      REL-XR   Level 1    Speed 50    Minutes 15    METs 4.06      Prescription Details   Frequency (times per week) 3    Duration Progress to 30 minutes of continuous aerobic without signs/symptoms of physical distress      Intensity   THRR 40-80% of Max Heartrate 118-152    Ratings of Perceived Exertion 11-13    Perceived Dyspnea 0-4      Progression   Progression Continue to progress workloads to maintain intensity without signs/symptoms of physical distress.      Resistance Training   Training Prescription Yes    Weight 3 lb    Reps 10-15             Perform Capillary Blood Glucose checks as needed.  Exercise Prescription Changes:   Exercise Prescription Changes     Row Name 08/13/22 0900             Response to Exercise   Blood Pressure (Admit) 124/68       Blood Pressure (Exercise) 138/72       Blood Pressure (Exit) 94/56       Heart Rate (Admit) 84 bpm  Heart Rate (Exercise) 105 bpm       Heart Rate (Exit) 80 bpm       Oxygen Saturation (Admit) 98 %       Oxygen Saturation (Exercise) 97 %       Oxygen Saturation (Exit) 100 %       Rating of Perceived Exertion (Exercise) 13       Perceived Dyspnea (Exercise) 3       Symptoms SOB       Comments 6MWT Results                Exercise Comments:   Exercise Comments     Row Name 08/19/22 0936           Exercise Comments First full day of exercise!  Patient was oriented to gym and equipment including functions, settings, policies, and procedures.  Patient's individual exercise  prescription and treatment plan were reviewed.  All starting workloads were established based on the results of the 6 minute walk test done at initial orientation visit.  The plan for exercise progression was also introduced and progression will be customized based on patient's performance and goals.                Exercise Goals and Review:   Exercise Goals     Row Name 08/13/22 0950             Exercise Goals   Increase Physical Activity Yes       Intervention Provide advice, education, support and counseling about physical activity/exercise needs.;Develop an individualized exercise prescription for aerobic and resistive training based on initial evaluation findings, risk stratification, comorbidities and participant's personal goals.       Expected Outcomes Short Term: Attend rehab on a regular basis to increase amount of physical activity.;Long Term: Exercising regularly at least 3-5 days a week.;Long Term: Add in home exercise to make exercise part of routine and to increase amount of physical activity.       Increase Strength and Stamina Yes       Intervention Provide advice, education, support and counseling about physical activity/exercise needs.;Develop an individualized exercise prescription for aerobic and resistive training based on initial evaluation findings, risk stratification, comorbidities and participant's personal goals.       Expected Outcomes Short Term: Increase workloads from initial exercise prescription for resistance, speed, and METs.;Short Term: Perform resistance training exercises routinely during rehab and add in resistance training at home;Long Term: Improve cardiorespiratory fitness, muscular endurance and strength as measured by increased METs and functional capacity (6MWT)       Able to understand and use rate of perceived exertion (RPE) scale Yes       Intervention Provide education and explanation on how to use RPE scale       Expected Outcomes Short  Term: Able to use RPE daily in rehab to express subjective intensity level;Long Term:  Able to use RPE to guide intensity level when exercising independently       Able to understand and use Dyspnea scale Yes       Intervention Provide education and explanation on how to use Dyspnea scale       Expected Outcomes Short Term: Able to use Dyspnea scale daily in rehab to express subjective sense of shortness of breath during exertion;Long Term: Able to use Dyspnea scale to guide intensity level when exercising independently       Knowledge and understanding of Target Heart Rate Range (THRR) Yes  Intervention Provide education and explanation of THRR including how the numbers were predicted and where they are located for reference       Expected Outcomes Short Term: Able to state/look up THRR;Long Term: Able to use THRR to govern intensity when exercising independently;Short Term: Able to use daily as guideline for intensity in rehab       Able to check pulse independently Yes       Intervention Provide education and demonstration on how to check pulse in carotid and radial arteries.;Review the importance of being able to check your own pulse for safety during independent exercise       Expected Outcomes Short Term: Able to explain why pulse checking is important during independent exercise;Long Term: Able to check pulse independently and accurately       Understanding of Exercise Prescription Yes       Intervention Provide education, explanation, and written materials on patient's individual exercise prescription       Expected Outcomes Long Term: Able to explain home exercise prescription to exercise independently;Short Term: Able to explain program exercise prescription                Exercise Goals Re-Evaluation :  Exercise Goals Re-Evaluation     [place] Name 08/19/22 0936             Exercise Goal Re-Evaluation   Exercise Goals Review Able to understand and use rate of perceived  exertion (RPE) scale;Able to understand and use Dyspnea scale;Knowledge and understanding of Target Heart Rate Range (THRR);Understanding of Exercise Prescription       Comments Reviewed RPE scale, THR and program prescription with pt today.  Pt voiced understanding and was given a copy of goals to take home.       Expected Outcomes Short: Use RPE daily to regulate intensity. Long: Follow program prescription in THR.                Discharge Exercise Prescription (Final Exercise Prescription Changes):  Exercise Prescription Changes - 08/13/22 0900       Response to Exercise   Blood Pressure (Admit) 124/68    Blood Pressure (Exercise) 138/72    Blood Pressure (Exit) 94/56    Heart Rate (Admit) 84 bpm    Heart Rate (Exercise) 105 bpm    Heart Rate (Exit) 80 bpm    Oxygen Saturation (Admit) 98 %    Oxygen Saturation (Exercise) 97 %    Oxygen Saturation (Exit) 100 %    Rating of Perceived Exertion (Exercise) 13    Perceived Dyspnea (Exercise) 3    Symptoms SOB    Comments 6MWT Results             Nutrition:  Target Goals: Understanding of nutrition guidelines, daily intake of sodium <1578m, cholesterol <202m calories 30% from fat and 7% or less from saturated fats, daily to have 5 or more servings of fruits and vegetables.  Education: All About Nutrition: -Group instruction provided by verbal, written material, interactive activities, discussions, models, and posters to present general guidelines for heart healthy nutrition including fat, fiber, MyPlate, the role of sodium in heart healthy nutrition, utilization of the nutrition label, and utilization of this knowledge for meal planning. Follow up email sent as well. Written material given at graduation. [facility]  Education need identified 08/13/22       Biometrics:  Pre Biometrics - 08/13/22 [phone]  Pre Biometrics   Height 6' 1.5" (1.867 m)     Weight 200 lb 1.6 oz (90.8 kg)    Waist Circumference 37.5 inches    Hip Circumference 38 inches    Waist to Hip Ratio 0.99 %    BMI (Calculated) 26.04    Single Leg Stand 3.8 seconds              Nutrition Therapy Plan and Nutrition Goals:  Nutrition Therapy & Goals - 08/13/22 0937       Intervention Plan   Intervention Prescribe, educate and counsel regarding individualized specific dietary modifications aiming towards targeted core components such as weight, hypertension, lipid management, diabetes, heart failure and other comorbidities.    Expected Outcomes Short Term Goal: Understand basic principles of dietary content, such as calories, fat, sodium, cholesterol and nutrients.;Short Term Goal: A plan has been developed with personal nutrition goals set during dietitian appointment.;Long Term Goal: Adherence to prescribed nutrition plan.             Nutrition Assessments:  MEDIFICTS Score Key: ?70 Need to make dietary changes  40-70 Heart Healthy Diet ? 40 Therapeutic Level Cholesterol Diet  Flowsheet Row Cardiac Rehab from 08/13/2022 in [facility] Cardiac and Pulmonary Rehab  Picture Your Hart Total Score on Admission 43      Picture Your Hart Scores: [id number] Unhealthy dietary pattern with much room for improvement. 41-50 Dietary pattern unlikely to meet recommendations for good health and room for improvement. 51-60 More healthful dietary pattern, with some room for improvement.  >60 Healthy dietary pattern, although there may be some specific behaviors that could be improved.    Nutrition Goals Re-Evaluation:   Nutrition Goals Discharge (Final Nutrition Goals Re-Evaluation):   Psychosocial: Target Goals: Acknowledge presence or absence of significant depression and/or stress, maximize coping skills, provide positive support system. Participant is able to verbalize types and ability to use techniques and skills needed for reducing stress and depression.    Education: Stress, Anxiety, and Depression - Group verbal and visual presentation to define topics covered.  Reviews how body is impacted by stress, anxiety, and depression.  Also discusses healthy ways to reduce stress and to treat/manage anxiety and depression.  Written material given at graduation.   Education: Sleep Hygiene -Provides group verbal and written instruction about how sleep can affect your health.  Define sleep hygiene, discuss sleep cycles and impact of sleep habits. Review good sleep hygiene tips.    Initial Review & Psychosocial Screening:  Initial Psych Review & Screening - 08/12/22 1553       Initial Review   Current issues with Current Stress Concerns;Current Depression    Source of Stress Concerns Occupation    Comments Had to quit his job this week.   Would like VR referral.. been treated for depression in past, none of the meds worked. or caused side effects.  Not taking any meds for depression now.      Family Dynamics   Good Support System? Yes   mom     Barriers   Psychosocial barriers to participate in program The patient should benefit from training in stress management and relaxation.      Screening Interventions   Interventions Encouraged to exercise;To provide support and resources with identified psychosocial needs;Provide feedback about the scores to participant    Expected Outcomes Short Term goal: Utilizing psychosocial counselor, staff and physician to assist with identification of specific Stressors or current issues interfering with healing process. Setting desired goal for  each stressor or current issue identified.;Long Term Goal: Stressors or current issues are controlled or eliminated.;Short Term goal: Identification and review with participant of any Quality of Life or Depression concerns found by scoring the questionnaire.;Long Term goal: The participant improves quality of Life and PHQ9 Scores as seen by post scores and/or verbalization of  changes             Quality of Life Scores:   Quality of Life - 08/13/22 0944       Quality of Life   Select Quality of Life      Quality of Life Scores   Health/Function Pre 10.2 %    Socioeconomic Pre 11 %    Psych/Spiritual Pre 8.43 %    Family Pre 24.1 %    GLOBAL Pre 12.01 %            Scores of 19 and below usually indicate a poorer quality of life in these areas.  A difference of  2-3 points is a clinically meaningful difference.  A difference of 2-3 points in the total score of the Quality of Life Index has been associated with significant improvement in overall quality of life, self-image, physical symptoms, and general health in studies assessing change in quality of life.  PHQ-9: Review Flowsheet       08/13/2022 07/21/2015  Depression screen PHQ 2/9  Decreased Interest 3 3  Down, Depressed, Hopeless 2 1  PHQ - 2 Score 5 4  Altered sleeping 2 1  Tired, decreased energy 3 3  Change in appetite 3 3  Feeling bad or failure about yourself  2 1  Trouble concentrating 3 1  Moving slowly or fidgety/restless 3 1  Suicidal thoughts 1 0  PHQ-9 Score 22 14  Difficult doing work/chores Very difficult Somewhat difficult   Interpretation of Total Score  Total Score Depression Severity:  1-4 = Minimal depression, 5-9 = Mild depression, 10-14 = Moderate depression, 15-19 = Moderately severe depression, 20-27 = Severe depression   Psychosocial Evaluation and Intervention:  Psychosocial Evaluation - 08/12/22 1649       Psychosocial Evaluation & Interventions   Interventions Encouraged to exercise with the program and follow exercise prescription    Comments Donald Hart has no barriers to attending the program.  He is ready to get started. He does feel depression symptoms, has a history of depression and has not been able to tolerate any of the anti depression meds. He is ready to get starte and learn all he can to work on keeping heart disease and his risk factors  controlled.   He lives with his mother. She does have some slight dementia.    Expected Outcomes STG Donald Hart is able to attend all scheduled sessions and see progression with his exercie and improved mood.  LTG Donald Hart continues to [employer] on his exercise progression and controlloing is depressive symptoms.    Continue Psychosocial Services  Follow up required by staff             Psychosocial Re-Evaluation:   Psychosocial Discharge (Final Psychosocial Re-Evaluation):   Vocational Rehabilitation: Provide vocational rehab assistance to qualifying candidates.   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 Participant will state understanding/return demonstration of topics presented as noted by education test scores.  Learning Barriers/Preferences:   General Cardiac Education Topics:  AED/CPR: - Group verbal and written instruction with the use of models to demonstrate the basic use of the AED with the basic ABC's of resuscitation.   Anatomy and Cardiac Procedures: - Group verbal and visual presentation and models provide information about basic cardiac anatomy and function. Reviews the testing methods done to diagnose heart disease and the outcomes of the test results. Describes the treatment choices: Medical Management, Angioplasty, or Coronary Bypass Surgery for treating various heart conditions including Myocardial Infarction, Angina, Valve Disease, and Cardiac Arrhythmias.  Written material given at graduation. Flowsheet Row Cardiac Rehab from  08/21/2022 in [facility] Cardiac and Pulmonary Rehab  Education need identified 08/13/22       Medication Safety: - Group verbal and visual instruction to review commonly prescribed medications for heart and lung disease. Reviews the medication, class of the drug, and side effects. Includes the steps to properly store meds and maintain the prescription regimen.  Written material given at graduation. Flowsheet Row Cardiac Rehab from 08/21/2022 in [facility] Cardiac and Pulmonary Rehab  Date 08/21/22  Educator MS  Instruction Review Code 1- Verbalizes Understanding       Intimacy: - Group verbal instruction through game format to discuss how heart and lung disease can affect sexual intimacy. Written material given at graduation..   Know Your Numbers and Heart Failure: - Group verbal and visual instruction to discuss disease risk factors for cardiac and pulmonary disease and treatment options.  Reviews associated critical values for Overweight/Obesity, Hypertension, Cholesterol, and Diabetes.  Discusses basics of heart failure: signs/symptoms and treatments.  Introduces Heart Failure Zone chart for action plan for heart failure.  Written material given at graduation.   Infection Prevention: - Provides verbal and written material to individual with discussion of infection control including proper hand washing and proper equipment cleaning during exercise session. Flowsheet Row Cardiac Rehab from 08/21/2022 in [facility] Cardiac and Pulmonary Rehab  Date 08/13/22  Educator NT  Instruction Review Code 1- Verbalizes Understanding       Falls Prevention: - Provides verbal and written material to individual with discussion of falls prevention and safety. Flowsheet Row Cardiac Rehab from 08/21/2022 in [facility] Cardiac and Pulmonary Rehab  Date 08/12/22  Educator SB  Instruction Review Code 1- Verbalizes Understanding       Other: -Provides group and verbal instruction on various topics (see  comments)   Knowledge Questionnaire Score:  Knowledge Questionnaire Score - 08/13/22 0937       Knowledge Questionnaire Score   Pre Score 19/26             Core Components/Risk Factors/Patient Goals at Admission:  Personal Goals and Risk Factors at Admission - 08/13/22 0938       Core Components/Risk Factors/Patient Goals on Admission    Weight Management Yes    Intervention Weight Management: Develop a combined nutrition and exercise program designed to reach desired caloric intake, while maintaining appropriate intake of nutrient and fiber, sodium and fats, and appropriate energy expenditure required for the weight goal.;Weight Management: Provide education and appropriate resources to help participant work on and attain dietary goals.    Admit Weight 200 lb 1.6 oz (90.8 kg)    Goal Weight: Long Term 200 lb (90.7  kg)    Expected Outcomes Short Term: Continue to assess and modify interventions until short term weight is achieved;Long Term: Adherence to nutrition and physical activity/exercise program aimed toward attainment of established weight goal;Weight Maintenance: Understanding of the daily nutrition guidelines, which includes 25-35% calories from fat, 7% or less cal from saturated fats, less than 260m cholesterol, less than 1.5gm of sodium, & 5 or more servings of fruits and vegetables daily    Tobacco Cessation Yes    Number of packs per day 0   Quit Smoking, Patient is still vaping   Intervention Assist the participant in steps to quit. Provide individualized education and counseling about committing to Tobacco Cessation, relapse prevention, and pharmacological support that can be provided by physician.;OAdvice worker assist with locating and accessing local/national Quit Smoking programs, and support quit date choice.    Expected Outcomes Short Term: Will demonstrate readiness to quit, by selecting a quit date.;Short Term: Will quit all tobacco product use,  adhering to prevention of relapse plan.;Long Term: Complete abstinence from all tobacco products for at least 12 months from quit date.    Diabetes Yes    Intervention Provide education about signs/symptoms and action to take for hypo/hyperglycemia.;Provide education about proper nutrition, including hydration, and aerobic/resistive exercise prescription along with prescribed medications to achieve blood glucose in normal ranges: Fasting glucose 65-99 mg/dL    Expected Outcomes Short Term: Participant verbalizes understanding of the signs/symptoms and immediate care of hyper/hypoglycemia, proper foot care and importance of medication, aerobic/resistive exercise and nutrition plan for blood glucose control.;Long Term: Attainment of HbA1C < 7%.    Hypertension Yes    Intervention Provide education on lifestyle modifcations including regular physical activity/exercise, weight management, moderate sodium restriction and increased consumption of fresh fruit, vegetables, and low fat dairy, alcohol moderation, and smoking cessation.;Monitor prescription use compliance.    Expected Outcomes Short Term: Continued assessment and intervention until BP is < 140/928mHG in hypertensive participants. < 130/8062mG in hypertensive participants with diabetes, heart failure or chronic kidney disease.;Long Term: Maintenance of blood pressure at goal levels.    Lipids Yes    Intervention Provide education and support for participant on nutrition & aerobic/resistive exercise along with prescribed medications to achieve LDL <74m61mDL >40mg2m Expected Outcomes Short Term: Participant states understanding of desired cholesterol values and is compliant with medications prescribed. Participant is following exercise prescription and nutrition guidelines.;Long Term: Cholesterol controlled with medications as prescribed, with individualized exercise RX and with personalized nutrition plan. Value goals: LDL < 74mg,52m > 40 mg.              Education:Diabetes - Individual verbal and written instruction to review signs/symptoms of diabetes, desired ranges of glucose level fasting, after meals and with exercise. Acknowledge that pre and post exercise glucose checks will be done for 3 sessions at entry of program. [place]  Education need identified 08/13/22       Core Components/Risk Factors/Patient Goals Review:    Core Components/Risk Factors/Patient Goals at Discharge (Final Review):    ITP Comments:  ITP Comments     Row Name 08/12/22 1613 08/13/22 0933 08/19/22 0936 08/21/22 1426     ITP Comments Virtual orientation call completed today. he has an appointment on Date: 0213/2024  for EP eval and gym Orientation.  Documentation of diagnosis can be found [facility] [id number] 6MWT and gym orientation. Initial ITP  created and sent for review to Dr. Emily Filbert, Medical Director. First full day of exercise!  Patient was oriented to gym and equipment including functions, settings, policies, and procedures.  Patient's individual exercise prescription and treatment plan were reviewed.  All starting workloads were established based on the results of the 6 minute walk test done at initial orientation visit.  The plan for exercise progression was also introduced and progression will be customized based on patient's performance and goals. 30 day review completed. ITP sent to Dr. Emily Filbert, Medical Director of Cardiac Rehab. Continue with ITP unless changes are made by physician. New to rehab, first day 08/19/22.             Comments: 30 day review

## 2022-08-21 NOTE — Progress Notes (Signed)
Daily Session Note  Patient Details  Name: Donald Hart MRN: [id number] Date of Birth: 1970/07/25 Referring Provider:   Flowsheet Row Cardiac Rehab from 08/13/2022 in [facility] Cardiac and Pulmonary Rehab  Referring Provider Dr. Kathlyn Sacramento, MD       Encounter Date: 08/21/2022  Check In:  Session Check In - 08/21/22 0916       Check-In   Supervising physician immediately available to respond to emergencies See telemetry face sheet for immediately available ER MD    Location [facility]-Cardiac & Pulmonary Rehab    Staff Present Renita Papa, RN BSN;Joseph Tessie Fass, RCP,RRT,BSRT;Noah Maren[place], [place], Exercise Physiologist    Virtual Visit No    Medication changes reported     No    Fall or balance concerns reported    No    Warm-up and Cool-down Performed on first and last piece of equipment    Resistance Training Performed Yes    VAD Patient? No    PAD/SET Patient? No      Pain Assessment   Currently in Pain? No/denies                Social History   Tobacco Use  Smoking Status Former   Packs/day: 1.00   Years: 20.00   Total pack years: 20.00   Types: Cigarettes   Quit date: 07/23/2022   Years since quitting: 0.0  Smokeless Tobacco Never  Tobacco Comments   Currently -ano cigarettes since heart attack vapes regularly throughout the week.  Wants to quit    Goals Met:  Independence with exercise equipment Exercise tolerated well No report of concerns or symptoms today Strength training completed today  Goals Unmet:  Not Applicable  Comments: Pt able to follow exercise prescription today without complaint.  Will continue to monitor for progression.    Dr. Emily Filbert is Medical Director for [place].  Dr. Ottie Glazier is Medical Director for [facility] Pulmonary Rehabilitation.

## 2022-08-23 ENCOUNTER — Encounter: Payer: Medicaid Other | Admitting: *Deleted

## 2022-08-23 DIAGNOSIS — I214 Non-ST elevation (NSTEMI) myocardial infarction: Secondary | ICD-10-CM

## 2022-08-23 DIAGNOSIS — Z955 Presence of coronary angioplasty implant and graft: Secondary | ICD-10-CM

## 2022-08-23 LAB — GLUCOSE, CAPILLARY
Glucose-Capillary: 288 mg/dL — ABNORMAL HIGH (ref 70–99)
Glucose-Capillary: 220 mg/dL — ABNORMAL HIGH (ref 70–99)

## 2022-08-23 NOTE — Progress Notes (Signed)
Daily Session Note  Patient Details  Name: Donald Hart MRN: [id number] Date of Birth: 1971/01/16 Referring Provider:   Flowsheet Row Cardiac Rehab from 08/13/2022 in [facility] Cardiac and Pulmonary Rehab  Referring Provider Dr. Kathlyn Sacramento, MD       Encounter Date: 08/23/2022  Check In:  Session Check In - 08/23/22 0930       Check-In   Supervising physician immediately available to respond to emergencies See telemetry face sheet for immediately available ER MD    Location [facility]-Cardiac & Pulmonary Rehab    Staff Present Alberteen Sam, MA, RCEP, CCRP, CCET;Joseph [place], RCP,RRT,BSRT;Other   Darel Hong, RN BSN   Virtual Visit No    Medication changes reported     No    Fall or balance concerns reported    No    Tobacco Cessation No Change    Warm-up and Cool-down Performed on first and last piece of equipment    Resistance Training Performed Yes    VAD Patient? No    PAD/SET Patient? No      PAD/SET Patient   Completed foot check today? No    Open wounds to report? No      Pain Assessment   Currently in Pain? No/denies    Multiple Pain Sites No                Social History   Tobacco Use  Smoking Status Former   Packs/day: 1.00   Years: 20.00   Total pack years: 20.00   Types: Cigarettes   Quit date: 07/23/2022   Years since quitting: 0.0  Smokeless Tobacco Never  Tobacco Comments   Currently -ano cigarettes since heart attack vapes regularly throughout the week.  Wants to quit    Goals Met:  Independence with exercise equipment Exercise tolerated well No report of concerns or symptoms today Strength training completed today  Goals Unmet:  Not Applicable  Comments: Pt able to follow exercise prescription today without complaint.  Will continue to monitor for progression.    Dr. Emily Filbert is Medical Director for [place].  Dr. Ottie Glazier is Medical Director for [facility] Pulmonary Rehabilitation.

## 2022-08-26 ENCOUNTER — Encounter: Payer: Medicaid Other | Admitting: *Deleted

## 2022-08-26 DIAGNOSIS — Z955 Presence of coronary angioplasty implant and graft: Secondary | ICD-10-CM

## 2022-08-26 DIAGNOSIS — I214 Non-ST elevation (NSTEMI) myocardial infarction: Secondary | ICD-10-CM | POA: Diagnosis not present

## 2022-08-26 NOTE — Progress Notes (Signed)
Daily Session Note  Patient Details  Name: Donald Hart MRN: [id number] Date of Birth: [birth year] Referring Provider:   Flowsheet Row Cardiac Rehab from 08/13/2022 in [facility] Cardiac and Pulmonary Rehab  Referring Provider Dr. Kathlyn Sacramento, MD       Encounter Date: 08/26/2022  Check In:  Session Check In - 08/26/22 0937       Check-In   Supervising physician immediately available to respond to emergencies See telemetry face sheet for immediately available ER MD    Location [facility]-Cardiac & Pulmonary Rehab    Staff Present Darlyne Russian, RN, Doyce Para, BS, ACSM CEP, Exercise Physiologist;Joseph Tessie Fass, [place]    Virtual Visit No    Medication changes reported     No    Fall or balance concerns reported    No    Warm-up and Cool-down Performed on first and last piece of equipment    Resistance Training Performed Yes    VAD Patient? No    PAD/SET Patient? No      Pain Assessment   Currently in Pain? No/denies                Social History   Tobacco Use  Smoking Status Former   Packs/day: 1.00   Years: 20.00   Total pack years: 20.00   Types: Cigarettes   Quit date: 07/23/2022   Years since quitting: 0.0  Smokeless Tobacco Never  Tobacco Comments   Currently -ano cigarettes since heart attack vapes regularly throughout the week.  Wants to quit    Goals Met:  Independence with exercise equipment Exercise tolerated well No report of concerns or symptoms today Strength training completed today  Goals Unmet:  Not Applicable  Comments: Pt able to follow exercise prescription today without complaint.  Will continue to monitor for progression.    Dr. Emily Filbert is Medical Director for [place].  Dr. Ottie Glazier is Medical Director for [facility] Pulmonary Rehabilitation.

## 2022-08-28 ENCOUNTER — Encounter: Payer: Medicaid Other | Admitting: *Deleted

## 2022-08-28 DIAGNOSIS — Z955 Presence of coronary angioplasty implant and graft: Secondary | ICD-10-CM

## 2022-08-28 DIAGNOSIS — I214 Non-ST elevation (NSTEMI) myocardial infarction: Secondary | ICD-10-CM

## 2022-08-28 NOTE — Progress Notes (Signed)
Daily Session Note  Patient Details  Name: Donald Hart MRN: [id number] Date of Birth: 1971-04-27 Referring Provider:   Flowsheet Row Cardiac Rehab from 08/13/2022 in [facility] Cardiac and Pulmonary Rehab  Referring Provider Dr. Kathlyn Sacramento, MD       Encounter Date: 08/28/2022  Check In:  Session Check In - 08/28/22 0917       Check-In   Supervising physician immediately available to respond to emergencies See telemetry face sheet for immediately available ER MD    Location [facility]-Cardiac & Pulmonary Rehab    Staff Present Darlyne Russian, RN, ADN;Laureen Owens Shark, BS, RRT, CPFT;Joseph Tessie Fass, RCP,RRT,BSRT;Noah Tickle, BS, Exercise Physiologist    Virtual Visit No    Medication changes reported     No    Fall or balance concerns reported    No    Warm-up and Cool-down Performed on first and last piece of equipment    Resistance Training Performed Yes    VAD Patient? No    PAD/SET Patient? No      Pain Assessment   Currently in Pain? No/denies                Social History   Tobacco Use  Smoking Status Former   Packs/day: 1.00   Years: 20.00   Total pack years: 20.00   Types: Cigarettes   Quit date: 07/23/2022   Years since quitting: 0.0  Smokeless Tobacco Never  Tobacco Comments   Currently -ano cigarettes since heart attack vapes regularly throughout the week.  Wants to quit    Goals Met:  Independence with exercise equipment Exercise tolerated well No report of concerns or symptoms today Strength training completed today  Goals Unmet:  Not Applicable  Comments: Pt able to follow exercise prescription today without complaint.  Will continue to monitor for progression.    Dr. Emily Filbert is Medical Director for [place].  Dr. Ottie Glazier is Medical Director for [facility] Pulmonary Rehabilitation.

## 2022-08-30 ENCOUNTER — Other Ambulatory Visit: Payer: Self-pay

## 2022-08-30 ENCOUNTER — Encounter: Payer: Medicaid Other | Attending: Cardiovascular Disease | Admitting: *Deleted

## 2022-08-30 DIAGNOSIS — Z955 Presence of coronary angioplasty implant and graft: Secondary | ICD-10-CM | POA: Insufficient documentation

## 2022-08-30 DIAGNOSIS — I214 Non-ST elevation (NSTEMI) myocardial infarction: Secondary | ICD-10-CM | POA: Insufficient documentation

## 2022-08-30 MED ORDER — TICAGRELOR 90 MG PO TABS: 90.0000 mg | ORAL_TABLET | Freq: Two times a day (BID) | ORAL | 1 refills | Status: DC

## 2022-08-30 NOTE — Progress Notes (Signed)
Daily Session Note  Patient Details  Name: Donald Hart MRN: [id number] Date of Birth: 1971-01-30 Referring Provider:   Flowsheet Row Cardiac Rehab from 08/13/2022 in [facility] Cardiac and Pulmonary Rehab  Referring Provider Dr. Kathlyn Sacramento, MD       Encounter Date: 08/30/2022  Check In:  Session Check In - 08/30/22 0936       Check-In   Supervising physician immediately available to respond to emergencies See telemetry face sheet for immediately available ER MD    Location [facility]-Cardiac & Pulmonary Rehab    Staff Present Darlyne Russian, RN, ADN;Jessica Luan [place], MA, RCEP, CCRP, CCET;Joseph [place], [place]    Virtual Visit No    Medication changes reported     No    Fall or balance concerns reported    No    Warm-up and Cool-down Performed on first and last piece of equipment    Resistance Training Performed Yes    VAD Patient? No    PAD/SET Patient? No      Pain Assessment   Currently in Pain? No/denies                Social History   Tobacco Use  Smoking Status Former   Packs/day: 1.00   Years: 20.00   Total pack years: 20.00   Types: Cigarettes   Quit date: 07/23/2022   Years since quitting: 0.1  Smokeless Tobacco Never  Tobacco Comments   Currently -ano cigarettes since heart attack vapes regularly throughout the week.  Wants to quit    Goals Met:  Independence with exercise equipment Exercise tolerated well No report of concerns or symptoms today Strength training completed today  Goals Unmet:  Not Applicable  Comments: Pt able to follow exercise prescription today without complaint.  Will continue to monitor for progression.    Dr. Emily Filbert is Medical Director for [place].  Dr. Ottie Glazier is Medical Director for [facility] Pulmonary Rehabilitation.

## 2022-09-02 ENCOUNTER — Encounter: Payer: Medicaid Other | Admitting: *Deleted

## 2022-09-02 DIAGNOSIS — Z955 Presence of coronary angioplasty implant and graft: Secondary | ICD-10-CM

## 2022-09-02 DIAGNOSIS — I214 Non-ST elevation (NSTEMI) myocardial infarction: Secondary | ICD-10-CM

## 2022-09-02 NOTE — Progress Notes (Signed)
Daily Session Note  Patient Details  Name: Donald Hart MRN: [id number] Date of Birth: 02/22/1971 Referring Provider:   Flowsheet Row Cardiac Rehab from 08/13/2022 in [facility] Cardiac and Pulmonary Rehab  Referring Provider Dr. Kathlyn Sacramento, MD       Encounter Date: 09/02/2022  Check In:  Session Check In - 09/02/22 0935       Check-In   Supervising physician immediately available to respond to emergencies See telemetry face sheet for immediately available ER MD    Location [facility]-Cardiac & Pulmonary Rehab    Staff Present Darlyne Russian, RN, Doyce Para, BS, ACSM CEP, Exercise Physiologist;Noah Tickle, BS, Exercise Physiologist    Virtual Visit No    Medication changes reported     No    Fall or balance concerns reported    No    Warm-up and Cool-down Performed on first and last piece of equipment    Resistance Training Performed Yes    VAD Patient? No    PAD/SET Patient? No      Pain Assessment   Currently in Pain? No/denies                Social History   Tobacco Use  Smoking Status Former   Packs/day: 1.00   Years: 20.00   Total pack years: 20.00   Types: Cigarettes   Quit date: 07/23/2022   Years since quitting: 0.1  Smokeless Tobacco Never  Tobacco Comments   Currently -ano cigarettes since heart attack vapes regularly throughout the week.  Wants to quit    Goals Met:  Independence with exercise equipment Exercise tolerated well No report of concerns or symptoms today Strength training completed today  Goals Unmet:  Not Applicable  Comments: Pt able to follow exercise prescription today without complaint.  Will continue to monitor for progression.    Dr. Emily Filbert is Medical Director for [place].  Dr. Ottie Glazier is Medical Director for [facility] Pulmonary Rehabilitation.

## 2022-09-06 ENCOUNTER — Encounter: Payer: Medicaid Other | Admitting: *Deleted

## 2022-09-06 DIAGNOSIS — Z955 Presence of coronary angioplasty implant and graft: Secondary | ICD-10-CM

## 2022-09-06 DIAGNOSIS — I214 Non-ST elevation (NSTEMI) myocardial infarction: Secondary | ICD-10-CM | POA: Diagnosis not present

## 2022-09-06 NOTE — Progress Notes (Signed)
Daily Session Note  Patient Details  Name: Greycen Geddes MRN: [id number] Date of Birth: Oct 13, 1970 Referring Provider:   Flowsheet Row Cardiac Rehab from 08/13/2022 in [facility] Cardiac and Pulmonary Rehab  Referring Provider Dr. Kathlyn Sacramento, MD       Encounter Date: 09/06/2022  Check In:  Session Check In - 09/06/22 1007       Check-In   Supervising physician immediately available to respond to emergencies See telemetry face sheet for immediately available ER MD    Location [facility]-Cardiac & Pulmonary Rehab    Staff Present Heath Lark, RN, BSN, CCRP;Jessica [place], MA, RCEP, CCRP, CCET;Joseph [place], [place]    Virtual Visit No    Medication changes reported     No    Fall or balance concerns reported    No    Warm-up and Cool-down Performed on first and last piece of equipment    Resistance Training Performed Yes    VAD Patient? No    PAD/SET Patient? No      Pain Assessment   Currently in Pain? No/denies                Social History   Tobacco Use  Smoking Status Former   Packs/day: 1.00   Years: 20.00   Total pack years: 20.00   Types: Cigarettes   Quit date: 07/23/2022   Years since quitting: 0.1  Smokeless Tobacco Never  Tobacco Comments   Currently -ano cigarettes since heart attack vapes regularly throughout the week.  Wants to quit    Goals Met:  Independence with exercise equipment Exercise tolerated well No report of concerns or symptoms today  Goals Unmet:  Not Applicable  Comments: Pt able to follow exercise prescription today without complaint.  Will continue to monitor for progression.    Dr. Emily Filbert is Medical Director for [place].  Dr. Ottie Glazier is Medical Director for [facility] Pulmonary Rehabilitation.

## 2022-09-09 ENCOUNTER — Telehealth: Payer: Self-pay | Admitting: Cardiovascular Disease

## 2022-09-09 ENCOUNTER — Encounter: Payer: Medicaid Other | Admitting: *Deleted

## 2022-09-09 DIAGNOSIS — Z955 Presence of coronary angioplasty implant and graft: Secondary | ICD-10-CM

## 2022-09-09 DIAGNOSIS — I214 Non-ST elevation (NSTEMI) myocardial infarction: Secondary | ICD-10-CM

## 2022-09-09 NOTE — Telephone Encounter (Signed)
Stop Brilinta and switch to Plavix 75 mg once daily with a 300 mg loading dose on the first day.

## 2022-09-09 NOTE — Telephone Encounter (Signed)
The patient called in with concerns about the Brilinta. He stated that since he has started it, he has had shortness of breath. He has tried taking it with caffeine which has not helped.   He stated that he stopped it over the weekend and he feels better today. He has been advised that he should not stop the medication and educated as to why. He is less than 2 months post cath/stent. He has stayed on the 81 mg aspirin. Marland Kitchen

## 2022-09-09 NOTE — Progress Notes (Signed)
Daily Session Note  Patient Details  Name: Donald Hart MRN: [id number] Date of Birth: December 25, 1970 Referring Provider:   Flowsheet Row Cardiac Rehab from 08/13/2022 in [facility] Cardiac and Pulmonary Rehab  Referring Provider Dr. Kathlyn Sacramento, MD       Encounter Date: 09/09/2022  Check In:  Session Check In - 09/09/22 0915       Check-In   Supervising physician immediately available to respond to emergencies See telemetry face sheet for immediately available ER MD    Location [facility]-Cardiac & Pulmonary Rehab    Staff Present Darlyne Russian, RN, Doyce Para, BS, ACSM CEP, Exercise Physiologist;Noah Tickle, BS, Exercise Physiologist    Virtual Visit No    Medication changes reported     No    Fall or balance concerns reported    No    Warm-up and Cool-down Performed on first and last piece of equipment    Resistance Training Performed Yes    VAD Patient? No    PAD/SET Patient? No      Pain Assessment   Currently in Pain? No/denies                Social History   Tobacco Use  Smoking Status Former   Packs/day: 1.00   Years: 20.00   Total pack years: 20.00   Types: Cigarettes   Quit date: 07/23/2022   Years since quitting: 0.1  Smokeless Tobacco Never  Tobacco Comments   Currently -ano cigarettes since heart attack vapes regularly throughout the week.  Wants to quit    Goals Met:  Independence with exercise equipment Exercise tolerated well No report of concerns or symptoms today Strength training completed today  Goals Unmet:  Not Applicable  Comments: Pt able to follow exercise prescription today without complaint.  Will continue to monitor for progression.    Dr. Emily Filbert is Medical Director for [place].  Dr. Ottie Glazier is Medical Director for [facility] Pulmonary Rehabilitation.

## 2022-09-09 NOTE — Telephone Encounter (Signed)
Pt c/o medication issue:  1. Name of Medication: ticagrelor (BRILINTA) 90 MG TABS tablet   2. How are you currently taking this medication (dosage and times per day)?    3. Are you having a reaction (difficulty breathing--STAT)? No   4. What is your medication issue? States that the medication makes him feel weird. Please advis e

## 2022-09-09 NOTE — Telephone Encounter (Signed)
Left a message for the patient to call back.  

## 2022-09-10 MED ORDER — CLOPIDOGREL BISULFATE 75 MG PO TABS: 75.0000 mg | ORAL_TABLET | Freq: Every day | ORAL | 3 refills | Status: DC

## 2022-09-10 NOTE — Telephone Encounter (Signed)
The patient has been made aware to start Plavix 75 mg once daily. He will take 300 mg on the first day and then 75 mg once daily following.

## 2022-09-11 ENCOUNTER — Encounter: Payer: Medicaid Other | Admitting: *Deleted

## 2022-09-13 ENCOUNTER — Encounter: Payer: Medicaid Other | Admitting: *Deleted

## 2022-09-13 DIAGNOSIS — Z955 Presence of coronary angioplasty implant and graft: Secondary | ICD-10-CM

## 2022-09-13 DIAGNOSIS — I214 Non-ST elevation (NSTEMI) myocardial infarction: Secondary | ICD-10-CM | POA: Diagnosis not present

## 2022-09-13 NOTE — Progress Notes (Signed)
Daily Session Note  Patient Details  Name: Donald Hart MRN: [id number] Date of Birth: 02/22/1971 Referring Provider:   Flowsheet Row Cardiac Rehab from 08/13/2022 in [facility] Cardiac and Pulmonary Rehab  Referring Provider Dr. Kathlyn Sacramento, MD       Encounter Date: 09/13/2022  Check In:  Session Check In - 09/13/22 1008       Check-In   Supervising physician immediately available to respond to emergencies See telemetry face sheet for immediately available ER MD    Location [facility]-Cardiac & Pulmonary Rehab    Staff Present Heath Lark, RN, BSN, CCRP;Jessica Sheridan, MA, RCEP, CCRP, CCET;Joseph [place], [place]    Virtual Visit No    Medication changes reported     No    Fall or balance concerns reported    No    Warm-up and Cool-down Performed on first and last piece of equipment    Resistance Training Performed Yes    VAD Patient? No    PAD/SET Patient? No      Pain Assessment   Currently in Pain? No/denies                Social History   Tobacco Use  Smoking Status Former   Packs/day: 1.00   Years: 20.00   Additional pack years: 0.00   Total pack years: 20.00   Types: Cigarettes   Quit date: 07/23/2022   Years since quitting: 0.1  Smokeless Tobacco Never  Tobacco Comments   Currently -ano cigarettes since heart attack vapes regularly throughout the week.  Wants to quit    Goals Met:  Independence with exercise equipment Exercise tolerated well No report of concerns or symptoms today  Goals Unmet:  Not Applicable  Comments: Pt able to follow exercise prescription today without complaint.  Will continue to monitor for progression.    Dr. Emily Filbert is Medical Director for [facility].  Dr. Ottie Glazier is Medical Director for [facility] Pulmonary Rehabilitation.

## 2022-09-16 ENCOUNTER — Encounter: Payer: Medicaid Other | Admitting: *Deleted

## 2022-09-16 DIAGNOSIS — I214 Non-ST elevation (NSTEMI) myocardial infarction: Secondary | ICD-10-CM

## 2022-09-16 DIAGNOSIS — Z955 Presence of coronary angioplasty implant and graft: Secondary | ICD-10-CM

## 2022-09-16 NOTE — Progress Notes (Signed)
Daily Session Note  Patient Details  Name: Donald Hart MRN: [id number] Date of Birth: December 20, 1970 Referring Provider:   Flowsheet Row Cardiac Rehab from 08/13/2022 in [facility] Cardiac and Pulmonary Rehab  Referring Provider Dr. Kathlyn Sacramento, MD       Encounter Date: 09/16/2022  Check In:  Session Check In - 09/16/22 0935       Check-In   Supervising physician immediately available to respond to emergencies See telemetry face sheet for immediately available ER MD    Location [facility]-Cardiac & Pulmonary Rehab    Staff Present Darlyne Russian, RN, Doyce Para, BS, ACSM CEP, Exercise Physiologist;Noah Tickle, BS, Exercise Physiologist    Virtual Visit No    Medication changes reported     No    Fall or balance concerns reported    No    Warm-up and Cool-down Performed on first and last piece of equipment    Resistance Training Performed Yes    VAD Patient? No    PAD/SET Patient? No      Pain Assessment   Currently in Pain? No/denies                Social History   Tobacco Use  Smoking Status Former   Packs/day: 1.00   Years: 20.00   Additional pack years: 0.00   Total pack years: 20.00   Types: Cigarettes   Quit date: 07/23/2022   Years since quitting: 0.1  Smokeless Tobacco Never  Tobacco Comments   Currently -ano cigarettes since heart attack vapes regularly throughout the week.  Wants to quit    Goals Met:  Independence with exercise equipment Exercise tolerated well No report of concerns or symptoms today Strength training completed today  Goals Unmet:  Not Applicable  Comments: Pt able to follow exercise prescription today without complaint.  Will continue to monitor for progression.     Dr. Emily Filbert is Medical Director for [facility].  Dr. Ottie Glazier is Medical Director for [facility] Pulmonary Rehabilitation.

## 2022-09-18 ENCOUNTER — Encounter: Payer: Self-pay | Admitting: Nurse Practitioner

## 2022-09-18 ENCOUNTER — Ambulatory Visit: Payer: Medicaid Other | Attending: Nurse Practitioner | Admitting: Nurse Practitioner

## 2022-09-18 ENCOUNTER — Encounter: Payer: Self-pay | Admitting: *Deleted

## 2022-09-18 ENCOUNTER — Encounter: Payer: Medicaid Other | Admitting: *Deleted

## 2022-09-18 VITALS — BP 116/66 | HR 74 | Ht 74.0 in | Wt 203.4 lb

## 2022-09-18 DIAGNOSIS — E1169 Type 2 diabetes mellitus with other specified complication: Secondary | ICD-10-CM | POA: Diagnosis not present

## 2022-09-18 DIAGNOSIS — I2510 Atherosclerotic heart disease of native coronary artery without angina pectoris: Secondary | ICD-10-CM

## 2022-09-18 DIAGNOSIS — I214 Non-ST elevation (NSTEMI) myocardial infarction: Secondary | ICD-10-CM

## 2022-09-18 DIAGNOSIS — E781 Pure hyperglyceridemia: Secondary | ICD-10-CM

## 2022-09-18 DIAGNOSIS — F17200 Nicotine dependence, unspecified, uncomplicated: Secondary | ICD-10-CM | POA: Diagnosis present

## 2022-09-18 DIAGNOSIS — I10 Essential (primary) hypertension: Secondary | ICD-10-CM

## 2022-09-18 DIAGNOSIS — Z955 Presence of coronary angioplasty implant and graft: Secondary | ICD-10-CM

## 2022-09-18 DIAGNOSIS — E785 Hyperlipidemia, unspecified: Secondary | ICD-10-CM

## 2022-09-18 NOTE — Progress Notes (Signed)
Office Visit    Patient Name: Donald Hart Date of Encounter: 09/18/2022  Primary Care Provider:  [facility] Primary Cardiologist:  Kathlyn Sacramento, MD  Chief Complaint    52 year old male with history of pancreatitis, chronic hypertriglyceridemia, remote alcohol abuse, tobacco abuse, diabetes, hypertension, hyperlipidemia, gout, chronic back pain, GERD, and family history of premature CAD, who presents for follow-up after recent hospitalization for non-STEMI and PCI in January 2024.  Past Medical History    Past Medical History:  Diagnosis Date   Acute pancreatitis 07/02/2015   hospitalization at [facility]   CAD (coronary artery disease)    a. 07/2022 NSTEMI/PCI: LM nl, LAD 33m, 50d, RI nl, LCX 9m (2.75x22 Onyx Frontier DES), RCA 20p/m, RPDA 30. EF 55-65%.   Chronic back pain    Diastolic dysfunction    a. 01/2016 Echo: EF 60-65%, GrI DD. Mild LVH, nl RV fxn, mildly dil LA; b. 07/2022 Echo: EF 60-65%, no rwma, nl RV fxn.   Family history of diabetes mellitus    sister   Family history of heart attack    Father.    Family history of stomach cancer    Gout    Hypertension    Hypertriglyceridemia    Remote Alcohol abuse    Tobacco abuse    Type 2 diabetes mellitus ([place]) 07/02/2015   Past Surgical History:  Procedure Laterality Date   CORONARY STENT INTERVENTION N/A 07/23/2022   Procedure: CORONARY STENT INTERVENTION;  Surgeon: Wellington Hampshire, MD;  Location: [place];  Service: Cardiovascular;  Laterality: N/A;   LEFT HEART CATH AND CORONARY ANGIOGRAPHY N/A 07/23/2022   Procedure: LEFT HEART CATH AND CORONARY ANGIOGRAPHY;  Surgeon: Wellington Hampshire, MD;  Location: [place] [facility];  Service: Cardiovascular;  Laterality: N/A;   NO PAST SURGERIES      Allergies  Allergies  Allergen Reactions   Losartan     cramping    History of Present Illness    52 year old male with the above past medical history including  pancreatitis, chronic hypertriglyceridemia, remote alcohol abuse, tobacco abuse, diabetes, hypertension, hyperlipidemia, gout, chronic back pain, GERD, and family history of premature CAD.  In 2017, he required hospitalization for pancreatitis and hypertriglyceridemia (6751) requiring plasmapheresis and insulin drip, complicated by distributive shock and respiratory failure requiring intubation, further complicated by MRSA pneumonia, bacteremia, AKI, and deconditioning.  Echo at that time showed normal LV function.  He was readmitted with pancreatitis in 2018 was evaluated by our team in the setting of elevated triglycerides with recommendation for outpatient cardiology follow-up.  On July 22, 2022, he was admitted to [facility] regional with substernal chest tightness, which on further questioning, he reported to be progressive over the preceding year.  He was initially hypotensive and ECG was unremarkable.  He ruled in for non-STEMI.  Diagnostic catheterization revealed severe left circumflex disease, which was successfully treated with a drug-eluting stent.  Echo during hospitalization showed normal LV function.  Mr. Carraher was last seen in cardiology clinic on July 31, 2022.  He reported episodic dyspnea and air hunger, more consistent with Brilinta therapy he agreed to remain on Brilinta for the time being.  His blood pressure was elevated I increased his carvedilol to 25 mg twice daily.  He was no longer smoking at that time.  On March 11, he contacted our office due to ongoing Brilinta associated dyspnea.  Brilinta was discontinued and following a 300 mg clopidogrel load, he has been taking clopidogrel  75 mg daily since.  Following this change, his dyspnea has resolved.  He continues to participate in cardiac rehab and has done well.  He notes that this past weekend, he had reproducible chest wall pain that was worse with palpation, deep breathing, and upper body movements.  This is since resolved.  He  denies angina, dyspnea, palpitations, PND, orthopnea, dizziness, syncope, edema, or early satiety.  Home Medications    Prior to Admission medications   Medication Sig Start Date End Date Taking? Authorizing Provider  aspirin EC 81 MG tablet Take 1 tablet (81 mg total) by mouth daily. Swallow whole. 07/25/22  Yes Sreenath, Sudheer B, MD  atorvastatin (LIPITOR) 80 MG tablet Take 80 mg by mouth daily.   Yes [provider]  carvedilol (COREG) 25 MG tablet Take 1 tablet (25 mg total) by mouth 2 (two) times daily. 07/31/22  Yes Theora Gianotti, NP  clopidogrel (PLAVIX) 75 MG tablet Take 1 tablet (75 mg total) by mouth daily. 09/10/22  Yes Wellington Hampshire, MD  fenofibrate 160 MG tablet Take 1 tablet (160 mg total) by mouth daily. Patient taking differently: Take 145 mg by mouth daily. 10/01/16  Yes Gouru, Illene Silver, MD  gabapentin (NEURONTIN) 800 MG tablet Take 800 mg by mouth 3 (three) times daily. 06/06/22  Yes [provider]  hydrochlorothiazide (HYDRODIURIL) 50 MG tablet Take 50 mg by mouth daily. 07/24/22  Yes [provider]  hydrOXYzine (ATARAX) 10 MG tablet Take 10 mg by mouth 3 (three) times daily. 08/12/22  Yes [provider]  JARDIANCE 25 MG TABS tablet Take 25 mg by mouth daily. 06/28/22  Yes [provider]  omeprazole (PRILOSEC) 20 MG capsule Take 20 mg by mouth daily as needed.   Yes [provider]  spironolactone (ALDACTONE) 25 MG tablet Take 25 mg by mouth every morning. 09/05/22  Yes [provider]     Review of Systems    Musculoskeletal and pleuritic chest pain over the weekend which is since resolved.  He denies angina, dyspnea, palpitations, PND, orthopnea, dizziness, syncope, edema, or early satiety.  All other systems reviewed and are otherwise negative except as noted above.    Physical Exam    VS:  BP 116/66 (BP Location: Left Arm, Patient Position: Sitting, Cuff Size: Normal)   Pulse 74   Ht 6\' 2"   (1.88 m)   Wt 203 lb 6.4 oz (92.3 kg)   SpO2 98%   BMI 26.12 kg/m  , BMI Body mass index is 26.12 kg/m.     GEN: Well nourished, well developed, in no acute distress. HEENT: normal. Neck: Supple, no JVD, carotid bruits, or masses. Cardiac: RRR, no murmurs, rubs, or gallops. No clubbing, cyanosis, edema.  Radials 2+/PT 2+ and equal bilaterally.  Respiratory:  Respirations regular and unlabored, clear to auscultation bilaterally. GI: Soft, nontender, nondistended, BS + x 4. MS: no deformity or atrophy. Skin: warm and dry, no rash. Neuro:  Strength and sensation are intact. Psych: Normal affect.  Accessory Clinical Findings    Lab Results  Component Value Date   WBC 25.2 (H) 07/23/2022   HGB 18.5 (H) 07/23/2022   HCT 52.3 (H) 07/23/2022   MCV 84.5 07/23/2022   PLT 298 07/23/2022   Lab Results  Component Value Date   CREATININE 0.85 07/23/2022   BUN 14 07/23/2022   NA 139 07/23/2022   K 3.5 07/23/2022   CL 106 07/23/2022   CO2 23 07/23/2022   Lab Results  Component  Value Date   ALT 30 07/23/2022   AST 49 (H) 07/23/2022   ALKPHOS 71 07/23/2022   BILITOT 1.8 (H) 07/23/2022   Lab Results  Component Value Date   CHOL 104 07/23/2022   HDL 27 (L) 07/23/2022   LDLCALC 33 07/23/2022   TRIG 221 (H) 07/23/2022   CHOLHDL 3.9 07/23/2022    Lab Results  Component Value Date   HGBA1C 6.6 (H) 07/23/2022    Assessment & Plan    1.  Coronary artery disease: Status post non-STEMI in January with severe left circumflex disease, which was treated with a drug-eluting stent.  He otherwise has moderate, nonobstructive disease.  He has been participating in cardiac rehabilitation and doing well.  He did have some pleuritic and chest wall pain over the weekend which has since resolved-suspect musculoskeletal.  He was switched from Brilinta to Plavix in the setting of Brilinta associated dyspnea, which has resolved following the change.  He otherwise remains on aspirin, statin, and  beta-blocker therapy.  2.  Essential hypertension: Blood pressure stable on beta-blocker therapy and HCTZ.  He notes that he was taking losartan following hospital discharge but that he believes that this was an error, as he previously did not tolerate losartan and was not taking at home prior to hospitalization.  He has stopped it.  3.  Hyperlipidemia/hypertriglyceridemia: LDL 33 with triglycerides of 221 during hospitalization.  He is currently on atorvastatin 80 mg, which was increased during his hospitalization.  He is also on fenofibrate.  Plan for follow-up lipids and LFTs.  4.  Type 2 diabetes mellitus: A1c 6.6 in January.  On Jardiance and followed by primary care.  5.  Tobacco/vape usage: No longer smoking cigarettes.  Still vaping but trying to cut back.  Recognizes he should quit.  Cessation advised.  6.  Disposition: Follow-up lipids and LFTs.  Follow-up in clinic in 3 months or sooner if necessary.   Murray Hodgkins, NP 09/18/2022, 4:53 PM

## 2022-09-18 NOTE — Progress Notes (Signed)
Cardiac Individual Treatment Plan  Patient Details  Name: Donald Hart MRN: [id number] Date of Birth: March 25, 1971 Referring Provider:   Flowsheet Row Cardiac Rehab from 08/13/2022 in [facility] Cardiac and Pulmonary Rehab  Referring Provider Dr. Kathlyn Sacramento, MD       Initial Encounter Date:  Flowsheet Row Cardiac Rehab from 08/13/2022 in [facility] Cardiac and Pulmonary Rehab  Date 08/13/22       Visit Diagnosis: NSTEMI (non-ST elevation myocardial infarction) [facility])  Status post coronary artery stent placement  Patient's Home Medications on Admission:  Current Outpatient Medications:    albuterol (PROVENTIL HFA;VENTOLIN HFA) 108 (90 Base) MCG/ACT inhaler, Inhale 1-2 puffs every 6 (six) hours as needed into the lungs for wheezing or shortness of breath., Disp: 1 Inhaler, Rfl: 0   aspirin EC 81 MG tablet, Take 1 tablet (81 mg total) by mouth daily. Swallow whole., Disp: 30 tablet, Rfl: 12   atorvastatin (LIPITOR) 80 MG tablet, Take 80 mg by mouth daily., Disp: , Rfl:    carvedilol (COREG) 25 MG tablet, Take 1 tablet (25 mg total) by mouth 2 (two) times daily., Disp: 180 tablet, Rfl: 3   clopidogrel (PLAVIX) 75 MG tablet, Take 1 tablet (75 mg total) by mouth daily., Disp: 90 tablet, Rfl: 3   colchicine 0.6 MG tablet, Take 0.6 mg by mouth daily as needed. (Patient not taking: Reported on 07/31/2022), Disp: , Rfl:    docusate sodium (COLACE) 100 MG capsule, Take 1 capsule (100 mg total) by mouth 2 (two) times daily as needed for mild constipation., Disp: 10 capsule, Rfl: 0   fenofibrate 160 MG tablet, Take 1 tablet (160 mg total) by mouth daily., Disp: 30 tablet, Rfl: 0   folic acid (FOLVITE) 1 MG tablet, Take 1 mg by mouth daily., Disp: , Rfl:    gabapentin (NEURONTIN) 800 MG tablet, Take 800 mg by mouth 3 (three) times daily., Disp: , Rfl:    hydrochlorothiazide (HYDRODIURIL) 50 MG tablet, Take 50 mg by mouth daily., Disp: , Rfl:    hyoscyamine (LEVSIN, ANASPAZ) 0.125 MG tablet, Take 1  tablet (0.125 mg total) by mouth every 6 (six) hours as needed for cramping., Disp: 30 tablet, Rfl: 0   JARDIANCE 25 MG TABS tablet, Take 25 mg by mouth daily., Disp: , Rfl:    losartan (COZAAR) 100 MG tablet, Take 1 tablet (100 mg total) by mouth daily., Disp: 30 tablet, Rfl: 0   magnesium oxide (MAG-OX) 400 MG tablet, Take 400 mg by mouth daily., Disp: , Rfl:    Multiple Vitamin (MULTIVITAMIN WITH MINERALS) TABS tablet, Take 1 tablet by mouth daily., Disp: , Rfl:    nicotine (NICODERM CQ - DOSED IN MG/24 HOURS) 21 mg/24hr patch, Place 1 patch (21 mg total) onto the skin daily. (Patient not taking: Reported on 07/31/2022), Disp: 28 patch, Rfl: 0   omega-3 acid ethyl esters (LOVAZA) 1 g capsule, Take 2 g by mouth 2 (two) times daily., Disp: , Rfl:    omeprazole (PRILOSEC) 20 MG capsule, Take 20 mg by mouth daily., Disp: , Rfl:    thiamine 100 MG tablet, Take 1 tablet (100 mg total) by mouth daily., Disp: , Rfl:   Past Medical History: Past Medical History:  Diagnosis Date   Acute pancreatitis 07/02/2015   hospitalization at [facility]   CAD (coronary artery disease)    a. 07/2022 NSTEMI/PCI: LM nl, LAD 16m, 50d, RI nl, LCX 76m (2.75x22 Onyx Frontier DES), RCA 20p/m, RPDA 30. EF 55-65%.   Chronic back pain  Diastolic dysfunction    a. 01/2016 Echo: EF 60-65%, GrI DD. Mild LVH, nl RV fxn, mildly dil LA; b. 07/2022 Echo: EF 60-65%, no rwma, nl RV fxn.   Family history of diabetes mellitus    sister   Family history of heart attack    Father.    Family history of stomach cancer    Gout    Hypertension    Hypertriglyceridemia    Remote Alcohol abuse    Tobacco abuse    Type 2 diabetes mellitus ([place]) 07/02/2015    Tobacco Use: Social History   Tobacco Use  Smoking Status Former   Packs/day: 1.00   Years: 20.00   Additional pack years: 0.00   Total pack years: 20.00   Types: Cigarettes   Quit date: 07/23/2022   Years since quitting: 0.1  Smokeless Tobacco Never  Tobacco Comments    Currently -ano cigarettes since heart attack vapes regularly throughout the week.  Wants to quit    Labs: Review Flowsheet  More data exists      Latest Ref Rng & Units 09/27/2016 09/28/2016 09/29/2016 09/30/2016 07/23/2022  Labs for ITP Cardiac and Pulmonary Rehab  Cholestrol 0 - 200 mg/dL - - - - 104   LDL (calc) 0 - 99 mg/dL - - - - 33   HDL-C >40 mg/dL - - - - 27   Trlycerides <150 mg/dL 610  524  654  676  650  647  642  221   Hemoglobin A1c 4.8 - 5.6 % - - - - 6.6      Exercise Target Goals: Exercise Program Goal: Individual exercise prescription set using results from initial 6 min walk test and THRR while considering  patient's activity barriers and safety.   Exercise Prescription Goal: Initial exercise prescription builds to 30-45 minutes a day of aerobic activity, 2-3 days per week.  Home exercise guidelines will be given to patient during program as part of exercise prescription that the participant will acknowledge.   Education: Aerobic Exercise: - Group verbal and visual presentation on the components of exercise prescription. Introduces F.I.T.T principle from ACSM for exercise prescriptions.  Reviews F.I.T.T. principles of aerobic exercise including progression. Written material given at graduation. Flowsheet Row Cardiac Rehab from 08/21/2022 in [facility] Cardiac and Pulmonary Rehab  Education need identified 08/13/22       Education: Resistance Exercise: - Group verbal and visual presentation on the components of exercise prescription. Introduces F.I.T.T principle from ACSM for exercise prescriptions  Reviews F.I.T.T. principles of resistance exercise including progression. Written material given at graduation.    Education: Exercise & Equipment Safety: - Individual verbal instruction and demonstration of equipment use and safety with use of the equipment. Flowsheet Row Cardiac Rehab from 08/21/2022 in [facility] Cardiac and Pulmonary Rehab  Date 08/13/22  Educator NT   Instruction Review Code 1- Verbalizes Understanding       Education: Exercise Physiology & General Exercise Guidelines: - Group verbal and written instruction with models to review the exercise physiology of the cardiovascular system and associated critical values. Provides general exercise guidelines with specific guidelines to those with heart or lung disease.    Education: Flexibility, Balance, Mind/Body Relaxation: - Group verbal and visual presentation with interactive activity on the components of exercise prescription. Introduces F.I.T.T principle from ACSM for exercise prescriptions. Reviews F.I.T.T. principles of flexibility and balance exercise training including progression. Also discusses the mind body connection.  Reviews various relaxation techniques to help reduce and manage stress (i.e. Deep  breathing, progressive muscle relaxation, and visualization). Balance handout provided to take home. Written material given at graduation.   Activity Barriers & Risk Stratification:  Activity Barriers & Cardiac Risk Stratification - 08/13/22 0949       Activity Barriers & Cardiac Risk Stratification   Activity Barriers [name];Other (comment)    Comments chronic muscle aches, lower back problems, Hx broken left arm    Cardiac Risk Stratification High             6 Minute Walk:  6 Minute Walk     Row Name 08/13/22 0947         6 Minute Walk   Phase Initial     Distance 1165 feet     Walk Time 6 minutes     # of Rest Breaks 0     MPH 2.21     METS 4.06     RPE 13     Perceived Dyspnea  3     VO2 Peak 14.2     Symptoms Yes (comment)     Comments SOB     Resting HR 84 bpm     Resting BP 124/68     Resting Oxygen Saturation  98 %     Exercise Oxygen Saturation  during 6 min walk 97 %     Max Ex. HR 105 bpm     Max Ex. BP 138/72     2 Minute Post BP 94/56              Oxygen Initial Assessment:   Oxygen Re-Evaluation:   Oxygen Discharge  (Final Oxygen Re-Evaluation):   Initial Exercise Prescription:  Initial Exercise Prescription - 08/13/22 0900       Date of Initial Exercise RX and Referring Provider   Date 08/13/22    Referring Provider Dr. Kathlyn Sacramento, MD      Oxygen   Maintain Oxygen Saturation 88% or higher      Treadmill   MPH 1.8    Grade 0.5    Minutes 15    METs 2.5      NuStep   Level 2    SPM 80    Minutes 15    METs 4.06      REL-XR   Level 1    Speed 50    Minutes 15    METs 4.06      Prescription Details   Frequency (times per week) 3    Duration Progress to 30 minutes of continuous aerobic without signs/symptoms of physical distress      Intensity   THRR 40-80% of Max Heartrate 118-152    Ratings of Perceived Exertion 11-13    Perceived Dyspnea 0-4      Progression   Progression Continue to progress workloads to maintain intensity without signs/symptoms of physical distress.      Resistance Training   Training Prescription Yes    Weight 3 lb    Reps 10-15             Perform Capillary Blood Glucose checks as needed.  Exercise Prescription Changes:   Exercise Prescription Changes     Row Name 08/13/22 0900 09/02/22 1300 09/17/22 0800         Response to Exercise   Blood Pressure (Admit) 124/68 122/74 102/68     Blood Pressure (Exercise) 138/72 130/80 146/80     Blood Pressure (Exit) 94/56 118/76 100/56     Heart Rate (Admit) 84 bpm 89 bpm  85 bpm     Heart Rate (Exercise) 105 bpm 152 bpm 137 bpm     Heart Rate (Exit) 80 bpm 117 bpm 74 bpm     Oxygen Saturation (Admit) 98 % -- --     Oxygen Saturation (Exercise) 97 % -- --     Oxygen Saturation (Exit) 100 % -- --     Rating of Perceived Exertion (Exercise) 13 13 11      Perceived Dyspnea (Exercise) 3 -- --     Symptoms SOB none none     Comments 6MWT Results 2nd full week of exercise --     Duration -- Continue with 30 min of aerobic exercise without signs/symptoms of physical distress. Continue with 30 min  of aerobic exercise without signs/symptoms of physical distress.     Intensity -- THRR unchanged THRR unchanged       Progression   Progression -- Continue to progress workloads to maintain intensity without signs/symptoms of physical distress. Continue to progress workloads to maintain intensity without signs/symptoms of physical distress.     Average METs -- 3.95 3.9       Resistance Training   Training Prescription -- Yes Yes     Weight -- 3 lb 3 lb     Reps -- 10-15 10-15       Interval Training   Interval Training -- No No       Treadmill   MPH -- 2.5 2.7     Grade -- 5 2.5     Minutes -- 15 15     METs -- 4.64 4       NuStep   Level -- 5 --     Minutes -- 15 --     METs -- 5.3 --       Recumbant Elliptical   Level -- 3 5     Minutes -- 15 15     METs -- 3.1 2.9       REL-XR   Level -- 7 7     Minutes -- 15 15     METs -- 4.4 6.8       Oxygen   Maintain Oxygen Saturation -- 88% or higher 88% or higher              Exercise Comments:   Exercise Comments     Row Name 08/19/22 0936           Exercise Comments First full day of exercise!  Patient was oriented to gym and equipment including functions, settings, policies, and procedures.  Patient's individual exercise prescription and treatment plan were reviewed.  All starting workloads were established based on the results of the 6 minute walk test done at initial orientation visit.  The plan for exercise progression was also introduced and progression will be customized based on patient's performance and goals.                Exercise Goals and Review:   Exercise Goals     Row Name 08/13/22 0950             Exercise Goals   Increase Physical Activity Yes       Intervention Provide advice, education, support and counseling about physical activity/exercise needs.;Develop an individualized exercise prescription for aerobic and resistive training based on initial evaluation findings, risk  stratification, comorbidities and participant's personal goals.       Expected Outcomes Short Term: Attend rehab on a regular basis to increase amount of  physical activity.;Long Term: Exercising regularly at least 3-5 days a week.;Long Term: Add in home exercise to make exercise part of routine and to increase amount of physical activity.       Increase Strength and Stamina Yes       Intervention Provide advice, education, support and counseling about physical activity/exercise needs.;Develop an individualized exercise prescription for aerobic and resistive training based on initial evaluation findings, risk stratification, comorbidities and participant's personal goals.       Expected Outcomes Short Term: Increase workloads from initial exercise prescription for resistance, speed, and METs.;Short Term: Perform resistance training exercises routinely during rehab and add in resistance training at home;Long Term: Improve cardiorespiratory fitness, muscular endurance and strength as measured by increased METs and functional capacity (6MWT)       Able to understand and use rate of perceived exertion (RPE) scale Yes       Intervention Provide education and explanation on how to use RPE scale       Expected Outcomes Short Term: Able to use RPE daily in rehab to express subjective intensity level;Long Term:  Able to use RPE to guide intensity level when exercising independently       Able to understand and use Dyspnea scale Yes       Intervention Provide education and explanation on how to use Dyspnea scale       Expected Outcomes Short Term: Able to use Dyspnea scale daily in rehab to express subjective sense of shortness of breath during exertion;Long Term: Able to use Dyspnea scale to guide intensity level when exercising independently       Knowledge and understanding of Target Heart Rate Range (THRR) Yes       Intervention Provide education and explanation of THRR including how the numbers were predicted  and where they are located for reference       Expected Outcomes Short Term: Able to state/look up THRR;Long Term: Able to use THRR to govern intensity when exercising independently;Short Term: Able to use daily as guideline for intensity in rehab       Able to check pulse independently Yes       Intervention Provide education and demonstration on how to check pulse in carotid and radial arteries.;Review the importance of being able to check your own pulse for safety during independent exercise       Expected Outcomes Short Term: Able to explain why pulse checking is important during independent exercise;Long Term: Able to check pulse independently and accurately       Understanding of Exercise Prescription Yes       Intervention Provide education, explanation, and written materials on patient's individual exercise prescription       Expected Outcomes Long Term: Able to explain home exercise prescription to exercise independently;Short Term: Able to explain program exercise prescription                Exercise Goals Re-Evaluation :  Exercise Goals Re-Evaluation     Row Name 08/19/22 0936 09/02/22 1340 09/17/22 0902         Exercise Goal Re-Evaluation   Exercise Goals Review Able to understand and use rate of perceived exertion (RPE) scale;Able to understand and use Dyspnea scale;Knowledge and understanding of Target Heart Rate Range (THRR);Understanding of Exercise Prescription Increase Physical Activity;Increase Strength and Stamina;Understanding of Exercise Prescription Increase Physical Activity;Increase Strength and Stamina;Understanding of Exercise Prescription     Comments Reviewed RPE scale, THR and program prescription with pt today.  Pt voiced  understanding and was given a copy of goals to take home. Donald Hart has been doing well for the first couple of weeks he has been at rehab. He has significantly increased on all of his exercises, including the treadmill, XR, REL, and T4. He has  been working an average of 4+ METS on each machine with appropriate RPEs. He has been reaching his THR each session. We will continue to monitor as he progresses in the program. Donald Hart continues to do well in the program. He has continued to work at an overall average MET level above 3.9 METs. He also improved to level 5 on the recumbent elliptical. He increased his treadmill speed to 2.7 mph while maintaining a 2.5% incline as well. We will continue to monitor his progress in the program.     Expected Outcomes Short: Use RPE daily to regulate intensity. Long: Follow program prescription in THR. Short: Continue to increase speed on treadmill gradually Long: Continue to increase overall MET level and strength Short: Continue to gradually increase treadmill workload. Long: Continue to improve strength and stamina.              Discharge Exercise Prescription (Final Exercise Prescription Changes):  Exercise Prescription Changes - 09/17/22 0800       Response to Exercise   Blood Pressure (Admit) 102/68    Blood Pressure (Exercise) 146/80    Blood Pressure (Exit) 100/56    Heart Rate (Admit) 85 bpm    Heart Rate (Exercise) 137 bpm    Heart Rate (Exit) 74 bpm    Rating of Perceived Exertion (Exercise) 11    Symptoms none    Duration Continue with 30 min of aerobic exercise without signs/symptoms of physical distress.    Intensity THRR unchanged      Progression   Progression Continue to progress workloads to maintain intensity without signs/symptoms of physical distress.    Average METs 3.9      Resistance Training   Training Prescription Yes    Weight 3 lb    Reps 10-15      Interval Training   Interval Training No      Treadmill   MPH 2.7    Grade 2.5    Minutes 15    METs 4      Recumbant Elliptical   Level 5    Minutes 15    METs 2.9      REL-XR   Level 7    Minutes 15    METs 6.8      Oxygen   Maintain Oxygen Saturation 88% or higher             Nutrition:   Target Goals: Understanding of nutrition guidelines, daily intake of sodium 1500mg , cholesterol 200mg , calories 30% from fat and 7% or less from saturated fats, daily to have 5 or more servings of fruits and vegetables.  Education: All About Nutrition: -Group instruction provided by verbal, written material, interactive activities, discussions, models, and posters to present general guidelines for heart healthy nutrition including fat, fiber, MyPlate, the role of sodium in heart healthy nutrition, utilization of the nutrition label, and utilization of this knowledge for meal planning. Follow up email sent as well. Written material given at graduation. Flowsheet Row Cardiac Rehab from 08/21/2022 in [facility] Cardiac and Pulmonary Rehab  Education need identified 08/13/22       Biometrics:  Pre Biometrics - 08/13/22 0950       Pre Biometrics   Height 6' 1.5" (1.867 m)  Weight 200 lb 1.6 oz (90.8 kg)    Waist Circumference 37.5 inches    Hip Circumference 38 inches    Waist to Hip Ratio 0.99 %    BMI (Calculated) 26.04    Single Leg Stand 3.8 seconds              Nutrition Therapy Plan and Nutrition Goals:  Nutrition Therapy & Goals - 09/18/22 0825       Personal Nutrition Goals   Comments Donald Hart reports only eating a night around 7pm. He reports that food does not taste right anymore since COVID, he still feels hungry, but he feels that he can only eat a couple of bites of food. He reports that his weight stays around 200lb and has had no significant weight changes. D:sandiwch, hamburger, he also likes sweets Drinks: gatoraide or water or [place] peach tea. He used to drink soda, but has since cut back. He feels like he might throw up when he is eating. He is looking for a new primary care doctor as of right now and has not mentioned it to his doctor.             Nutrition Assessments:  MEDIFICTS Score Key: ?70 Need to make dietary changes  40-70 Heart Healthy Diet ? 40  Therapeutic Level Cholesterol Diet  Flowsheet Row Cardiac Rehab from 08/13/2022 in [facility] Cardiac and Pulmonary Rehab  Picture Your Hart Total Score on Admission 43      Picture Your Hart Scores: [id number] Unhealthy dietary pattern with much room for improvement. 41-50 Dietary pattern unlikely to meet recommendations for good health and room for improvement. 51-60 More healthful dietary pattern, with some room for improvement.  >60 Healthy dietary pattern, although there may be some specific behaviors that could be improved.    Nutrition Goals Re-Evaluation:  Nutrition Goals Re-Evaluation     [place] Name 09/06/22 0927             Goals   Current Weight 200 lb (90.7 kg)       Nutrition Goal Meet with RD       Comment Donald Hart would like to get his diet under control and meet with the RD.       Expected Outcome Short: meet with RD. Long: adhere to a diet that pertains to him.                Nutrition Goals Discharge (Final Nutrition Goals Re-Evaluation):  Nutrition Goals Re-Evaluation - 09/06/22 0927       Goals   Current Weight 200 lb (90.7 kg)    Nutrition Goal Meet with RD    Comment Donald Hart would like to get his diet under control and meet with the RD.    Expected Outcome Short: meet with RD. Long: adhere to a diet that pertains to him.             Psychosocial: Target Goals: Acknowledge presence or absence of significant depression and/or stress, maximize coping skills, provide positive support system. Participant is able to verbalize types and ability to use techniques and skills needed for reducing stress and depression.   Education: Stress, Anxiety, and Depression - Group verbal and visual presentation to define topics covered.  Reviews how body is impacted by stress, anxiety, and depression.  Also discusses healthy ways to reduce stress and to treat/manage anxiety and depression.  Written material given at graduation.   Education: Sleep Hygiene -Provides group  verbal and written instruction about how sleep  can affect your health.  Define sleep hygiene, discuss sleep cycles and impact of sleep habits. Review good sleep hygiene tips.    Initial Review & Psychosocial Screening:  Initial Psych Review & Screening - 08/12/22 1553       Initial Review   Current issues with Current Stress Concerns;Current Depression    Source of Stress Concerns Occupation    Comments Had to quit his job this week.   Would like VR referral.. been treated for depression in past, none of the meds worked. or caused side effects.  Not taking any meds for depression now.      Family Dynamics   Good Support System? Yes   mom     Barriers   Psychosocial barriers to participate in program The patient should benefit from training in stress management and relaxation.      Screening Interventions   Interventions Encouraged to exercise;To provide support and resources with identified psychosocial needs;Provide feedback about the scores to participant    Expected Outcomes Short Term goal: Utilizing psychosocial counselor, staff and physician to assist with identification of specific Stressors or current issues interfering with healing process. Setting desired goal for each stressor or current issue identified.;Long Term Goal: Stressors or current issues are controlled or eliminated.;Short Term goal: Identification and review with participant of any Quality of Life or Depression concerns found by scoring the questionnaire.;Long Term goal: The participant improves quality of Life and PHQ9 Scores as seen by post scores and/or verbalization of changes             Quality of Life Scores:   Quality of Life - 08/13/22 0944       Quality of Life   Select Quality of Life      Quality of Life Scores   Health/Function Pre 10.2 %    Socioeconomic Pre 11 %    Psych/Spiritual Pre 8.43 %    Family Pre 24.1 %    GLOBAL Pre 12.01 %            Scores of 19 and below usually  indicate a poorer quality of life in these areas.  A difference of  2-3 points is a clinically meaningful difference.  A difference of 2-3 points in the total score of the Quality of Life Index has been associated with significant improvement in overall quality of life, self-image, physical symptoms, and general health in studies assessing change in quality of life.  PHQ-9: Review Flowsheet       09/06/2022 08/13/2022 07/21/2015  Depression screen PHQ 2/9  Decreased Interest 3 3 3   Down, Depressed, Hopeless 2 2 1   PHQ - 2 Score 5 5 4   Altered sleeping 1 2 1   Tired, decreased energy 3 3 3   Change in appetite 3 3 3   Feeling bad or failure about yourself  2 2 1   Trouble concentrating 3 3 1   Moving slowly or fidgety/restless 3 3 1   Suicidal thoughts 0 1 0  PHQ-9 Score 20 22 14   Difficult doing work/chores Extremely dIfficult Very difficult Somewhat difficult   Interpretation of Total Score  Total Score Depression Severity:  1-4 = Minimal depression, 5-9 = Mild depression, 10-14 = Moderate depression, 15-19 = Moderately severe depression, 20-27 = Severe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

## 2022-09-18 NOTE — Progress Notes (Signed)
Completed initial RD consultation ?

## 2022-09-18 NOTE — Patient Instructions (Signed)
Medication Instructions:  No changes *If you need a refill on your cardiac medications before your next appointment, please call your pharmacy*   Lab Work: Your provider would like for you to return in the next few days to have the following labs drawn: CMET and Lipid.   Please go to the [facility] entrance and check in at the front desk.  You do not need an appointment.  They are open from 7am-6 pm.  You will need to be fasting.  If you have labs (blood work) drawn today and your tests are completely normal, you will receive your results only by: [place] (if you have MyChart) OR A paper copy in the mail If you have any lab test that is abnormal or we need to change your treatment, we will call you to review the results.   Testing/Procedures: None ordered   Follow-Up: At [facility], you and your health needs are our priority.  As part of our continuing mission to provide you with exceptional heart care, we have created designated Provider Care Teams.  These Care Teams include your primary Cardiologist (physician) and Advanced Practice Providers (APPs -  Physician Assistants and Nurse Practitioners) who all work together to provide you with the care you need, when you need it.  We recommend signing up for the patient portal called "MyChart".  Sign up information is provided on this After Visit Summary.  MyChart is used to connect with patients for Virtual Visits (Telemedicine).  Patients are able to view lab/test results, encounter notes, upcoming appointments, etc.  Non-urgent messages can be sent to your provider as well.   To learn more about what you can do with MyChart, go to [email or web address].    Your next appointment:   3 month(s)  Provider:   You may see Kathlyn Sacramento, MD or one of the following Advanced Practice Providers on your designated Care Team:   Murray Hodgkins, NP

## 2022-09-18 NOTE — Progress Notes (Signed)
Daily Session Note  Patient Details  Name: Donald Hart MRN: [id number] Date of Birth: 08-31-1970 Referring Provider:   Flowsheet Row Cardiac Rehab from 08/13/2022 in [facility] Cardiac and Pulmonary Rehab  Referring Provider Dr. Kathlyn Sacramento, MD       Encounter Date: 09/18/2022  Check In:  Session Check In - 09/18/22 0943       Check-In   Supervising physician immediately available to respond to emergencies See telemetry face sheet for immediately available ER MD    Location [facility]-Cardiac & Pulmonary Rehab    Staff Present Darlyne Russian, RN, ADN;Joseph Tessie Fass, RCP,RRT,BSRT;Noah Tickle, BS, Exercise Physiologist    Virtual Visit No    Medication changes reported     No    Fall or balance concerns reported    No    Warm-up and Cool-down Performed on first and last piece of equipment    Resistance Training Performed Yes    VAD Patient? No    PAD/SET Patient? No      Pain Assessment   Currently in Pain? No/denies                Social History   Tobacco Use  Smoking Status Former   Packs/day: 1.00   Years: 20.00   Additional pack years: 0.00   Total pack years: 20.00   Types: Cigarettes   Quit date: 07/23/2022   Years since quitting: 0.1  Smokeless Tobacco Never  Tobacco Comments   Currently -ano cigarettes since heart attack vapes regularly throughout the week.  Wants to quit    Goals Met:  Independence with exercise equipment Exercise tolerated well No report of concerns or symptoms today Strength training completed today  Goals Unmet:  Not Applicable  Comments: Pt able to follow exercise prescription today without complaint.  Will continue to monitor for progression.    Dr. Emily Filbert is Medical Director for [place].  Dr. Ottie Glazier is Medical Director for [facility] Pulmonary Rehabilitation.

## 2022-09-20 ENCOUNTER — Encounter: Payer: Medicaid Other | Admitting: *Deleted

## 2022-09-20 DIAGNOSIS — I214 Non-ST elevation (NSTEMI) myocardial infarction: Secondary | ICD-10-CM

## 2022-09-20 DIAGNOSIS — Z955 Presence of coronary angioplasty implant and graft: Secondary | ICD-10-CM

## 2022-09-20 NOTE — Progress Notes (Signed)
Daily Session Note  Patient Details  Name: Jahzeel Hogan MRN: [id number] Date of Birth: 1970/07/09 Referring Provider:   Flowsheet Row Cardiac Rehab from 08/13/2022 in [facility] Cardiac and Pulmonary Rehab  Referring Provider Dr. Kathlyn Sacramento, MD       Encounter Date: 09/20/2022  Check In:  Session Check In - 09/20/22 1011       Check-In   Supervising physician immediately available to respond to emergencies See telemetry face sheet for immediately available ER MD    Location [facility]-Cardiac & Pulmonary Rehab    Staff Present Heath Lark, RN, BSN, CCRP;Jessica Bergoo, MA, RCEP, CCRP, CCET;Joseph Meggett, [place]    Virtual Visit No    Medication changes reported     No    Fall or balance concerns reported    No    Warm-up and Cool-down Performed on first and last piece of equipment    Resistance Training Performed Yes    VAD Patient? No    PAD/SET Patient? No      Pain Assessment   Currently in Pain? No/denies                Social History   Tobacco Use  Smoking Status Former   Packs/day: 1.00   Years: 20.00   Additional pack years: 0.00   Total pack years: 20.00   Types: Cigarettes   Quit date: 07/23/2022   Years since quitting: 0.1  Smokeless Tobacco Never  Tobacco Comments   Currently -ano cigarettes since heart attack vapes regularly throughout the week.  Wants to quit    Goals Met:  Independence with exercise equipment Exercise tolerated well No report of concerns or symptoms today  Goals Unmet:  Not Applicable  Comments: Pt able to follow exercise prescription today without complaint.  Will continue to monitor for progression.    Dr. Emily Filbert is Medical Director for [place].  Dr. Ottie Glazier is Medical Director for [facility] Pulmonary Rehabilitation.

## 2022-09-23 ENCOUNTER — Encounter: Payer: Medicaid Other | Admitting: *Deleted

## 2022-09-23 DIAGNOSIS — Z955 Presence of coronary angioplasty implant and graft: Secondary | ICD-10-CM

## 2022-09-23 DIAGNOSIS — I214 Non-ST elevation (NSTEMI) myocardial infarction: Secondary | ICD-10-CM | POA: Diagnosis not present

## 2022-09-23 NOTE — Progress Notes (Signed)
Daily Session Note  Patient Details  Name: Donald Hart MRN: [id number] Date of Birth: 04/01/1971 Referring Provider:   Flowsheet Row Cardiac Rehab from 08/13/2022 in [facility] Cardiac and Pulmonary Rehab  Referring Provider Dr. Kathlyn Sacramento, MD       Encounter Date: 09/23/2022  Check In:  Session Check In - 09/23/22 0930       Check-In   Supervising physician immediately available to respond to emergencies See telemetry face sheet for immediately available ER MD    Location [facility]-Cardiac & Pulmonary Rehab    Staff Present Darlyne Russian, RN, Dimple Nanas, BS, Exercise Physiologist;Kelly Amedeo Plenty, BS, ACSM CEP, Exercise Physiologist    Virtual Visit No    Medication changes reported     No    Fall or balance concerns reported    Yes    Comments fell yesterday, lost balance, did not hit head, fell on left hip, no bruising present per pt. Pt with left hip pain, states he feels ok to exercise today. Pt will let us know of any changes in s/s or worsening pain.    Warm-up and Cool-down Performed on first and last piece of equipment    Resistance Training Performed Yes    VAD Patient? No    PAD/SET Patient? No      Pain Assessment   Currently in Pain? No/denies                Social History   Tobacco Use  Smoking Status Former   Packs/day: 1.00   Years: 20.00   Additional pack years: 0.00   Total pack years: 20.00   Types: Cigarettes   Quit date: 07/23/2022   Years since quitting: 0.1  Smokeless Tobacco Never  Tobacco Comments   Currently -ano cigarettes since heart attack vapes regularly throughout the week.  Wants to quit    Goals Met:  Independence with exercise equipment Exercise tolerated well No report of concerns or symptoms today Strength training completed today  Goals Unmet:  Not Applicable  Comments: Pt able to follow exercise prescription today without complaint.  Will continue to monitor for progression.    Dr. Emily Filbert is Medical  Director for [place].  Dr. Ottie Glazier is Medical Director for [facility] Pulmonary Rehabilitation.

## 2022-09-25 ENCOUNTER — Encounter: Payer: Medicaid Other | Admitting: *Deleted

## 2022-09-25 DIAGNOSIS — Z955 Presence of coronary angioplasty implant and graft: Secondary | ICD-10-CM

## 2022-09-25 DIAGNOSIS — I214 Non-ST elevation (NSTEMI) myocardial infarction: Secondary | ICD-10-CM | POA: Diagnosis not present

## 2022-09-25 NOTE — Progress Notes (Signed)
Daily Session Note  Patient Details  Name: Donald Hart MRN: [id number] Date of Birth: [birth year] Referring Provider:   Flowsheet Row Cardiac Rehab from 08/13/2022 in [facility] Cardiac and Pulmonary Rehab  Referring Provider Dr. Kathlyn Sacramento, MD       Encounter Date: 09/25/2022  Check In:  Session Check In - 09/25/22 0928       Check-In   Supervising physician immediately available to respond to emergencies See telemetry face sheet for immediately available ER MD    Location [facility]-Cardiac & Pulmonary Rehab    Staff Present Hope Budds MS, RDN, Melody Haver, BS, Exercise Physiologist;Megan Tamala Julian, RN, ADN;Naketa Daddario RN, BSN;Joseph Hood, RCP,RRT,BSRT    Virtual Visit No    Medication changes reported     No    Fall or balance concerns reported    No    Warm-up and Cool-down Performed on first and last piece of equipment    Resistance Training Performed Yes    VAD Patient? No    PAD/SET Patient? No      PAD/SET Patient   Completed foot check today? No      Pain Assessment   Currently in Pain? No/denies                Social History   Tobacco Use  Smoking Status Former   Packs/day: 1.00   Years: 20.00   Additional pack years: 0.00   Total pack years: 20.00   Types: Cigarettes   Quit date: 07/23/2022   Years since quitting: 0.1  Smokeless Tobacco Never  Tobacco Comments   Currently -ano cigarettes since heart attack vapes regularly throughout the week.  Wants to quit    Goals Met:  Independence with exercise equipment Exercise tolerated well No report of concerns or symptoms today Strength training completed today  Goals Unmet:  Not Applicable  Comments: Pt able to follow exercise prescription today without complaint.  Will continue to monitor for progression.    Dr. Emily Filbert is Medical Director for [place].  Dr. Ottie Glazier is Medical Director for [facility] Pulmonary Rehabilitation.

## 2022-09-27 ENCOUNTER — Encounter: Payer: Medicaid Other | Admitting: *Deleted

## 2022-09-27 DIAGNOSIS — I214 Non-ST elevation (NSTEMI) myocardial infarction: Secondary | ICD-10-CM | POA: Diagnosis not present

## 2022-09-27 DIAGNOSIS — Z955 Presence of coronary angioplasty implant and graft: Secondary | ICD-10-CM

## 2022-09-27 NOTE — Progress Notes (Signed)
Daily Session Note  Patient Details  Name: Bain Lewi MRN: [id number] Date of Birth: 1970/12/24 Referring Provider:   Flowsheet Row Cardiac Rehab from 08/13/2022 in [facility] Cardiac and Pulmonary Rehab  Referring Provider Dr. Kathlyn Sacramento, MD       Encounter Date: 09/27/2022  Check In:  Session Check In - 09/27/22 1004       Check-In   Supervising physician immediately available to respond to emergencies See telemetry face sheet for immediately available ER MD    Location [facility]-Cardiac & Pulmonary Rehab    Staff Present Heath Lark, RN, BSN, CCRP;Krista Spencer RN, BSN;Joseph [place], [place]    Virtual Visit No    Medication changes reported     No    Fall or balance concerns reported    No    Warm-up and Cool-down Performed on first and last piece of equipment    Resistance Training Performed Yes    VAD Patient? No    PAD/SET Patient? No      Pain Assessment   Currently in Pain? No/denies                Social History   Tobacco Use  Smoking Status Former   Packs/day: 1.00   Years: 20.00   Additional pack years: 0.00   Total pack years: 20.00   Types: Cigarettes   Quit date: 07/23/2022   Years since quitting: 0.1  Smokeless Tobacco Never  Tobacco Comments   Currently -ano cigarettes since heart attack vapes regularly throughout the week.  Wants to quit    Goals Met:  Independence with exercise equipment Exercise tolerated well No report of concerns or symptoms today  Goals Unmet:  Not Applicable  Comments: Pt able to follow exercise prescription today without complaint.  Will continue to monitor for progression.    Dr. Emily Filbert is Medical Director for [place].  Dr. Ottie Glazier is Medical Director for [facility] Pulmonary Rehabilitation.

## 2022-09-30 ENCOUNTER — Encounter: Payer: Medicaid Other | Attending: Cardiovascular Disease | Admitting: *Deleted

## 2022-09-30 DIAGNOSIS — Z955 Presence of coronary angioplasty implant and graft: Secondary | ICD-10-CM | POA: Insufficient documentation

## 2022-09-30 DIAGNOSIS — I214 Non-ST elevation (NSTEMI) myocardial infarction: Secondary | ICD-10-CM | POA: Insufficient documentation

## 2022-09-30 NOTE — Progress Notes (Signed)
Daily Session Note  Patient Details  Name: Brodhi Penilla MRN: [id number] Date of Birth: [birth year] Referring Provider:   Flowsheet Row Cardiac Rehab from 08/13/2022 in [facility] Cardiac and Pulmonary Rehab  Referring Provider Dr. Kathlyn Sacramento, MD       Encounter Date: 09/30/2022  Check In:  Session Check In - 09/30/22 0947       Check-In   Supervising physician immediately available to respond to emergencies See telemetry face sheet for immediately available ER MD    Location [facility]-Cardiac & Pulmonary Rehab    Staff Present Darlyne Russian, RN, Doyce Para, BS, ACSM CEP, Exercise Physiologist;Joseph Tessie Fass, [place]    Virtual Visit No    Medication changes reported     No    Fall or balance concerns reported    No    Tobacco Cessation No Change    Warm-up and Cool-down Performed on first and last piece of equipment    Resistance Training Performed Yes    VAD Patient? No    PAD/SET Patient? No      Pain Assessment   Currently in Pain? No/denies                Social History   Tobacco Use  Smoking Status Former   Packs/day: 1.00   Years: 20.00   Additional pack years: 0.00   Total pack years: 20.00   Types: Cigarettes   Quit date: 07/23/2022   Years since quitting: 0.1  Smokeless Tobacco Never  Tobacco Comments   Currently -ano cigarettes since heart attack vapes regularly throughout the week.  Wants to quit    Goals Met:  Independence with exercise equipment Exercise tolerated well No report of concerns or symptoms today Strength training completed today  Goals Unmet:  Not Applicable  Comments: Pt able to follow exercise prescription today without complaint.  Will continue to monitor for progression.    Dr. Emily Filbert is Medical Director for [place].  Dr. Ottie Glazier is Medical Director for [facility] Pulmonary Rehabilitation.

## 2022-10-02 ENCOUNTER — Encounter: Payer: Medicaid Other | Admitting: *Deleted

## 2022-10-02 DIAGNOSIS — I214 Non-ST elevation (NSTEMI) myocardial infarction: Secondary | ICD-10-CM

## 2022-10-02 DIAGNOSIS — Z955 Presence of coronary angioplasty implant and graft: Secondary | ICD-10-CM

## 2022-10-02 NOTE — Progress Notes (Signed)
Daily Session Note  Patient Details  Name: Whitten Arney MRN: [id number] Date of Birth: Dec 26, 1970 Referring Provider:   Flowsheet Row Cardiac Rehab from 08/13/2022 in [facility] Cardiac and Pulmonary Rehab  Referring Provider Dr. Kathlyn Sacramento, MD       Encounter Date: 10/02/2022  Check In:  Session Check In - 10/02/22 0929       Check-In   Supervising physician immediately available to respond to emergencies See telemetry face sheet for immediately available ER MD    Location [facility]-Cardiac & Pulmonary Rehab    Staff Present Darlyne Russian, RN, ADN;Joseph Tessie Fass, RCP,RRT,BSRT;Noah Tickle, BS, Exercise Physiologist    Virtual Visit No    Medication changes reported     No    Fall or balance concerns reported    No    Warm-up and Cool-down Performed on first and last piece of equipment    Resistance Training Performed Yes    VAD Patient? No    PAD/SET Patient? No      Pain Assessment   Currently in Pain? No/denies                Social History   Tobacco Use  Smoking Status Former   Packs/day: 1.00   Years: 20.00   Additional pack years: 0.00   Total pack years: 20.00   Types: Cigarettes   Quit date: 07/23/2022   Years since quitting: 0.1  Smokeless Tobacco Never  Tobacco Comments   Currently -ano cigarettes since heart attack vapes regularly throughout the week.  Wants to quit    Goals Met:  Independence with exercise equipment Exercise tolerated well No report of concerns or symptoms today Strength training completed today  Goals Unmet:  Not Applicable  Comments: Pt able to follow exercise prescription today without complaint.  Will continue to monitor for progression.    Dr. Emily Filbert is Medical Director for [place].  Dr. Ottie Glazier is Medical Director for [facility] Pulmonary Rehabilitation.

## 2022-10-04 DIAGNOSIS — Z955 Presence of coronary angioplasty implant and graft: Secondary | ICD-10-CM

## 2022-10-04 DIAGNOSIS — I214 Non-ST elevation (NSTEMI) myocardial infarction: Secondary | ICD-10-CM

## 2022-10-04 NOTE — Progress Notes (Signed)
Daily Session Note  Patient Details  Name: Donald Hart MRN: [id number] Date of Birth: [birth year] Referring Provider:   Flowsheet Row Cardiac Rehab from 08/13/2022 in [facility] Cardiac and Pulmonary Rehab  Referring Provider Dr. Lorine Bears, MD       Encounter Date: 10/04/2022  Check In:  Session Check In - 10/04/22 0919       Check-In   Supervising physician immediately available to respond to emergencies See telemetry face sheet for immediately available ER MD    Location [facility]-Cardiac & Pulmonary Rehab    Staff Present Darcel Bayley, RN,BC,MSN;Joseph [place], RCP,RRT,BSRT;Noah [place], [place], Exercise Physiologist    Virtual Visit No    Medication changes reported     No    Fall or balance concerns reported    No    Tobacco Cessation No Change    Warm-up and Cool-down Performed on first and last piece of equipment    Resistance Training Performed Yes    VAD Patient? No    PAD/SET Patient? No      PAD/SET Patient   Completed foot check today? No    Open wounds to report? No      Pain Assessment   Currently in Pain? No/denies    Multiple Pain Sites No                Social History   Tobacco Use  Smoking Status Former   Packs/day: 1.00   Years: 20.00   Additional pack years: 0.00   Total pack years: 20.00   Types: Cigarettes   Quit date: 07/23/2022   Years since quitting: 0.2  Smokeless Tobacco Never  Tobacco Comments   Currently -ano cigarettes since heart attack vapes regularly throughout the week.  Wants to quit    Goals Met:  Independence with exercise equipment Exercise tolerated well No report of concerns or symptoms today  Goals Unmet:  Not Applicable  Comments: Pt able to follow exercise prescription today without complaint.  Will continue to monitor for progression.    Dr. Bethann Punches is Medical Director for [facility] Cardiac Rehabilitation.  Dr. Vida Rigger is Medical Director for [facility] Pulmonary Rehabilitation.

## 2022-10-07 ENCOUNTER — Encounter: Payer: Medicaid Other | Admitting: *Deleted

## 2022-10-07 DIAGNOSIS — Z955 Presence of coronary angioplasty implant and graft: Secondary | ICD-10-CM

## 2022-10-07 DIAGNOSIS — I214 Non-ST elevation (NSTEMI) myocardial infarction: Secondary | ICD-10-CM

## 2022-10-07 NOTE — Progress Notes (Signed)
Daily Session Note  Patient Details  Name: Donald Hart MRN: [id number] Date of Birth: 04-Jun-1971 Referring Provider:   Flowsheet Row Cardiac Rehab from 08/13/2022 in [facility] Cardiac and Pulmonary Rehab  Referring Provider Dr. Lorine Bears, MD       Encounter Date: 10/07/2022  Check In:  Session Check In - 10/07/22 0928       Check-In   Supervising physician immediately available to respond to emergencies See telemetry face sheet for immediately available ER MD    Location [facility]-Cardiac & Pulmonary Rehab    Staff Present Lanny Hurst, RN, Franki Monte, BS, ACSM CEP, Exercise Physiologist;Noah Tickle, BS, Exercise Physiologist    Virtual Visit No    Medication changes reported     Yes    Comments stopped jardiance    Fall or balance concerns reported    No    Warm-up and Cool-down Performed on first and last piece of equipment    Resistance Training Performed Yes    VAD Patient? No    PAD/SET Patient? No      Pain Assessment   Currently in Pain? No/denies                Social History   Tobacco Use  Smoking Status Former   Packs/day: 1.00   Years: 20.00   Additional pack years: 0.00   Total pack years: 20.00   Types: Cigarettes   Quit date: 07/23/2022   Years since quitting: 0.2  Smokeless Tobacco Never  Tobacco Comments   Currently -ano cigarettes since heart attack vapes regularly throughout the week.  Wants to quit    Goals Met:  Independence with exercise equipment Exercise tolerated well No report of concerns or symptoms today Strength training completed today  Goals Unmet:  Not Applicable  Comments: Pt able to follow exercise prescription today without complaint.  Will continue to monitor for progression.    Dr. Bethann Punches is Medical Director for [facility] Cardiac Rehabilitation.  Dr. Vida Rigger is Medical Director for [facility] Pulmonary Rehabilitation.

## 2022-10-09 ENCOUNTER — Encounter: Payer: Medicaid Other | Admitting: *Deleted

## 2022-10-09 DIAGNOSIS — Z955 Presence of coronary angioplasty implant and graft: Secondary | ICD-10-CM

## 2022-10-09 DIAGNOSIS — I214 Non-ST elevation (NSTEMI) myocardial infarction: Secondary | ICD-10-CM | POA: Diagnosis not present

## 2022-10-09 NOTE — Progress Notes (Signed)
Daily Session Note  Patient Details  Name: Donald Hart MRN: [id number] Date of Birth: Jul 27, 1970 Referring Provider:   Flowsheet Row Cardiac Rehab from 08/13/2022 in [facility] Cardiac and Pulmonary Rehab  Referring Provider Dr. Lorine Bears, MD       Encounter Date: 10/09/2022  Check In:  Session Check In - 10/09/22 0936       Check-In   Supervising physician immediately available to respond to emergencies See telemetry face sheet for immediately available ER MD    Location [facility]-Cardiac & Pulmonary Rehab    Staff Present Lanny Hurst, RN, ADN;Jessica Juanetta Gosling, MA, RCEP, CCRP, CCET;Noah Tickle, BS, Exercise Physiologist    Virtual Visit No    Medication changes reported     No    Fall or balance concerns reported    No    Warm-up and Cool-down Performed on first and last piece of equipment    Resistance Training Performed Yes    VAD Patient? No    PAD/SET Patient? No      Pain Assessment   Currently in Pain? No/denies                Social History   Tobacco Use  Smoking Status Former   Packs/day: 1.00   Years: 20.00   Additional pack years: 0.00   Total pack years: 20.00   Types: Cigarettes   Quit date: 07/23/2022   Years since quitting: 0.2  Smokeless Tobacco Never  Tobacco Comments   Currently -ano cigarettes since heart attack vapes regularly throughout the week.  Wants to quit    Goals Met:  Independence with exercise equipment Exercise tolerated well No report of concerns or symptoms today Strength training completed today  Goals Unmet:  Not Applicable  Comments: Pt able to follow exercise prescription today without complaint.  Will continue to monitor for progression.    Dr. Bethann Punches is Medical Director for [facility] Cardiac Rehabilitation.  Dr. Vida Rigger is Medical Director for [facility] Pulmonary Rehabilitation.

## 2022-10-14 ENCOUNTER — Encounter: Payer: Medicaid Other | Admitting: *Deleted

## 2022-10-14 DIAGNOSIS — I214 Non-ST elevation (NSTEMI) myocardial infarction: Secondary | ICD-10-CM

## 2022-10-14 DIAGNOSIS — Z955 Presence of coronary angioplasty implant and graft: Secondary | ICD-10-CM

## 2022-10-14 NOTE — Progress Notes (Signed)
Daily Session Note  Patient Details  Name: Donald Hart MRN: [id number] Date of Birth: [birth year] Referring Provider:   Flowsheet Row Cardiac Rehab from 08/13/2022 in [facility] Cardiac and Pulmonary Rehab  Referring Provider Dr. Lorine Bears, MD       Encounter Date: 10/14/2022  Check In:  Session Check In - 10/14/22 0952       Check-In   Supervising physician immediately available to respond to emergencies See telemetry face sheet for immediately available ER MD    Location [facility]-Cardiac & Pulmonary Rehab    Staff Present Lanny Hurst, RN, Franki Monte, BS, ACSM CEP, Exercise Physiologist;Noah Tickle, BS, Exercise Physiologist    Virtual Visit No    Medication changes reported     No    Fall or balance concerns reported    No    Warm-up and Cool-down Performed on first and last piece of equipment    Resistance Training Performed Yes    VAD Patient? No    PAD/SET Patient? No      Pain Assessment   Currently in Pain? No/denies                Social History   Tobacco Use  Smoking Status Former   Packs/day: 1.00   Years: 20.00   Additional pack years: 0.00   Total pack years: 20.00   Types: Cigarettes   Quit date: 07/23/2022   Years since quitting: 0.2  Smokeless Tobacco Never  Tobacco Comments   Currently -ano cigarettes since heart attack vapes regularly throughout the week.  Wants to quit    Goals Met:  Independence with exercise equipment Exercise tolerated well No report of concerns or symptoms today Strength training completed today  Goals Unmet:  Not Applicable  Comments: Pt able to follow exercise prescription today without complaint.  Will continue to monitor for progression.    Dr. Bethann Punches is Medical Director for [facility] Cardiac Rehabilitation.  Dr. Vida Rigger is Medical Director for [facility] Pulmonary Rehabilitation.

## 2022-10-16 ENCOUNTER — Encounter: Payer: Medicaid Other | Admitting: *Deleted

## 2022-10-16 ENCOUNTER — Encounter: Payer: Self-pay | Admitting: *Deleted

## 2022-10-16 DIAGNOSIS — Z955 Presence of coronary angioplasty implant and graft: Secondary | ICD-10-CM

## 2022-10-16 DIAGNOSIS — I214 Non-ST elevation (NSTEMI) myocardial infarction: Secondary | ICD-10-CM

## 2022-10-16 NOTE — Progress Notes (Signed)
Cardiac Individual Treatment Plan  Patient Details  Name: Donald Hart MRN: [id number] Date of Birth: Mar 18, 1971 Referring Provider:   Flowsheet Row Cardiac Rehab from 08/13/2022 in [facility] Cardiac and Pulmonary Rehab  Referring Provider Dr. Lorine Bears, MD       Initial Encounter Date:  Flowsheet Row Cardiac Rehab from 08/13/2022 in [facility] Cardiac and Pulmonary Rehab  Date 08/13/22       Visit Diagnosis: NSTEMI (non-ST elevation myocardial infarction)  Status post coronary artery stent placement  Patient's Home Medications on Admission:  Current Outpatient Medications:    albuterol (PROVENTIL HFA;VENTOLIN HFA) 108 (90 Base) MCG/ACT inhaler, Inhale 1-2 puffs every 6 (six) hours as needed into the lungs for wheezing or shortness of breath. (Patient not taking: Reported on 09/18/2022), Disp: 1 Inhaler, Rfl: 0   aspirin EC 81 MG tablet, Take 1 tablet (81 mg total) by mouth daily. Swallow whole., Disp: 30 tablet, Rfl: 12   atorvastatin (LIPITOR) 80 MG tablet, Take 80 mg by mouth daily., Disp: , Rfl:    carvedilol (COREG) 25 MG tablet, Take 1 tablet (25 mg total) by mouth 2 (two) times daily., Disp: 180 tablet, Rfl: 3   clopidogrel (PLAVIX) 75 MG tablet, Take 1 tablet (75 mg total) by mouth daily., Disp: 90 tablet, Rfl: 3   colchicine 0.6 MG tablet, Take 0.6 mg by mouth daily as needed. (Patient not taking: Reported on 07/31/2022), Disp: , Rfl:    docusate sodium (COLACE) 100 MG capsule, Take 1 capsule (100 mg total) by mouth 2 (two) times daily as needed for mild constipation. (Patient not taking: Reported on 09/18/2022), Disp: 10 capsule, Rfl: 0   fenofibrate 160 MG tablet, Take 1 tablet (160 mg total) by mouth daily. (Patient taking differently: Take 145 mg by mouth daily.), Disp: 30 tablet, Rfl: 0   folic acid (FOLVITE) 1 MG tablet, Take 1 mg by mouth daily. (Patient not taking: Reported on 09/18/2022), Disp: , Rfl:    gabapentin (NEURONTIN) 800 MG tablet, Take 800 mg by mouth 3  (three) times daily., Disp: , Rfl:    hydrochlorothiazide (HYDRODIURIL) 50 MG tablet, Take 50 mg by mouth daily., Disp: , Rfl:    hydrOXYzine (ATARAX) 10 MG tablet, Take 10 mg by mouth 3 (three) times daily., Disp: , Rfl:    hyoscyamine (LEVSIN, ANASPAZ) 0.125 MG tablet, Take 1 tablet (0.125 mg total) by mouth every 6 (six) hours as needed for cramping. (Patient not taking: Reported on 09/18/2022), Disp: 30 tablet, Rfl: 0   JARDIANCE 25 MG TABS tablet, Take 25 mg by mouth daily., Disp: , Rfl:    losartan (COZAAR) 100 MG tablet, Take 1 tablet (100 mg total) by mouth daily., Disp: 30 tablet, Rfl: 0   magnesium oxide (MAG-OX) 400 MG tablet, Take 400 mg by mouth daily. (Patient not taking: Reported on 09/18/2022), Disp: , Rfl:    Multiple Vitamin (MULTIVITAMIN WITH MINERALS) TABS tablet, Take 1 tablet by mouth daily. (Patient not taking: Reported on 09/18/2022), Disp: , Rfl:    nicotine (NICODERM CQ - DOSED IN MG/24 HOURS) 21 mg/24hr patch, Place 1 patch (21 mg total) onto the skin daily. (Patient not taking: Reported on 07/31/2022), Disp: 28 patch, Rfl: 0   omega-3 acid ethyl esters (LOVAZA) 1 g capsule, Take 2 g by mouth 2 (two) times daily. (Patient not taking: Reported on 09/18/2022), Disp: , Rfl:    omeprazole (PRILOSEC) 20 MG capsule, Take 20 mg by mouth daily as needed., Disp: , Rfl:    spironolactone (  ALDACTONE) 25 MG tablet, Take 25 mg by mouth every morning., Disp: , Rfl:    thiamine 100 MG tablet, Take 1 tablet (100 mg total) by mouth daily. (Patient not taking: Reported on 09/18/2022), Disp: , Rfl:   Past Medical History: Past Medical History:  Diagnosis Date   Acute pancreatitis 07/02/2015   hospitalization at [facility]   CAD (coronary artery disease)    a. 07/2022 NSTEMI/PCI: LM nl, LAD 27m, 50d, RI nl, LCX 12m (2.75x22 Onyx Frontier DES), RCA 20p/m, RPDA 30. EF 55-65%.   Chronic back pain    Diastolic dysfunction    a. 01/2016 Echo: EF 60-65%, GrI DD. Mild LVH, nl RV fxn, mildly dil LA; b.  07/2022 Echo: EF 60-65%, no rwma, nl RV fxn.   Family history of diabetes mellitus    sister   Family history of heart attack    Father.    Family history of stomach cancer    Gout    Hypertension    Hypertriglyceridemia    Remote Alcohol abuse    Tobacco abuse    Type 2 diabetes mellitus (HCC) 07/02/2015    Tobacco Use: Social History   Tobacco Use  Smoking Status Former   Packs/day: 1.00   Years: 20.00   Additional pack years: 0.00   Total pack years: 20.00   Types: Cigarettes   Quit date: 07/23/2022   Years since quitting: 0.2  Smokeless Tobacco Never  Tobacco Comments   Currently -ano cigarettes since heart attack vapes regularly throughout the week.  Wants to quit    Labs: Review Flowsheet  More data exists      Latest Ref Rng & Units 09/27/2016 09/28/2016 09/29/2016 09/30/2016 07/23/2022  Labs for ITP Cardiac and Pulmonary Rehab  Cholestrol 0 - 200 mg/dL - - - - 829   LDL (calc) 0 - 99 mg/dL - - - - 33   HDL-C >56 mg/dL - - - - 27   Trlycerides <150 mg/dL 213  086  578  469  629  647  642  221   Hemoglobin A1c 4.8 - 5.6 % - - - - 6.6      Exercise Target Goals: Exercise Program Goal: Individual exercise prescription set using results from initial 6 min walk test and THRR while considering  patient's activity barriers and safety.   Exercise Prescription Goal: Initial exercise prescription builds to 30-45 minutes a day of aerobic activity, 2-3 days per week.  Home exercise guidelines will be given to patient during program as part of exercise prescription that the participant will acknowledge.   Education: Aerobic Exercise: - Group verbal and visual presentation on the components of exercise prescription. Introduces F.I.T.T principle from ACSM for exercise prescriptions.  Reviews F.I.T.T. principles of aerobic exercise including progression. Written material given at graduation. Flowsheet Row Cardiac Rehab from 10/09/2022 in [facility] Cardiac and Pulmonary Rehab   Education need identified 08/13/22  Date 09/25/22  Educator [facility]  Instruction Review Code 1- Verbalizes Understanding       Education: Resistance Exercise: - Group verbal and visual presentation on the components of exercise prescription. Introduces F.I.T.T principle from ACSM for exercise prescriptions  Reviews F.I.T.T. principles of resistance exercise including progression. Written material given at graduation.    Education: Exercise & Equipment Safety: - Individual verbal instruction and demonstration of equipment use and safety with use of the equipment. Flowsheet Row Cardiac Rehab from 10/09/2022 in [facility] Cardiac and Pulmonary Rehab  Date 08/13/22  Educator NT  Instruction Review Code  1- Verbalizes Understanding       Education: Exercise Physiology & General Exercise Guidelines: - Group verbal and written instruction with models to review the exercise physiology of the cardiovascular system and associated critical values. Provides general exercise guidelines with specific guidelines to those with heart or lung disease.  Flowsheet Row Cardiac Rehab from 10/09/2022 in [facility] Cardiac and Pulmonary Rehab  Date 09/18/22  Educator [facility]  Instruction Review Code 1- Verbalizes Understanding       Education: Flexibility, Balance, Mind/Body Relaxation: - Group verbal and visual presentation with interactive activity on the components of exercise prescription. Introduces F.I.T.T principle from ACSM for exercise prescriptions. Reviews F.I.T.T. principles of flexibility and balance exercise training including progression. Also discusses the mind body connection.  Reviews various relaxation techniques to help reduce and manage stress (i.e. Deep breathing, progressive muscle relaxation, and visualization). Balance handout provided to take home. Written material given at graduation.   Activity Barriers & Risk Stratification:  Activity Barriers & Cardiac Risk Stratification - 08/13/22 0949        Activity Barriers & Cardiac Risk Stratification   Activity Barriers [name];Other (comment)    Comments chronic muscle aches, lower back problems, Hx broken left arm    Cardiac Risk Stratification High             6 Minute Walk:  6 Minute Walk     Row Name 08/13/22 0947         6 Minute Walk   Phase Initial     Distance 1165 feet     Walk Time 6 minutes     # of Rest Breaks 0     MPH 2.21     METS 4.06     RPE 13     Perceived Dyspnea  3     VO2 Peak 14.2     Symptoms Yes (comment)     Comments SOB     Resting HR 84 bpm     Resting BP 124/68     Resting Oxygen Saturation  98 %     Exercise Oxygen Saturation  during 6 min walk 97 %     Max Ex. HR 105 bpm     Max Ex. BP 138/72     2 Minute Post BP 94/56              Oxygen Initial Assessment:   Oxygen Re-Evaluation:   Oxygen Discharge (Final Oxygen Re-Evaluation):   Initial Exercise Prescription:  Initial Exercise Prescription - 08/13/22 0900       Date of Initial Exercise RX and Referring Provider   Date 08/13/22    Referring Provider Dr. Lorine Bears, MD      Oxygen   Maintain Oxygen Saturation 88% or higher      Treadmill   MPH 1.8    Grade 0.5    Minutes 15    METs 2.5      NuStep   Level 2    SPM 80    Minutes 15    METs 4.06      REL-XR   Level 1    Speed 50    Minutes 15    METs 4.06      Prescription Details   Frequency (times per week) 3    Duration Progress to 30 minutes of continuous aerobic without signs/symptoms of physical distress      Intensity   THRR 40-80% of Max Heartrate 118-152    Ratings of Perceived Exertion  11-13    Perceived Dyspnea 0-4      Progression   Progression Continue to progress workloads to maintain intensity without signs/symptoms of physical distress.      Resistance Training   Training Prescription Yes    Weight 3 lb    Reps 10-15             Perform Capillary Blood Glucose checks as needed.  Exercise  Prescription Changes:   Exercise Prescription Changes     Row Name 08/13/22 0900 09/02/22 1300 09/17/22 0800 09/25/22 0900 09/30/22 1200     Response to Exercise   Blood Pressure (Admit) 124/68 122/74 102/68 -- 128/64   Blood Pressure (Exercise) 138/72 130/80 146/80 -- --   Blood Pressure (Exit) 94/56 118/76 100/56 -- 118/78   Heart Rate (Admit) 84 bpm 89 bpm 85 bpm -- 75 bpm   Heart Rate (Exercise) 105 bpm 152 bpm 137 bpm -- 113 bpm   Heart Rate (Exit) 80 bpm 117 bpm 74 bpm -- 86 bpm   Oxygen Saturation (Admit) 98 % -- -- -- --   Oxygen Saturation (Exercise) 97 % -- -- -- --   Oxygen Saturation (Exit) 100 % -- -- -- --   Rating of Perceived Exertion (Exercise) 13 13 11  -- 13   Perceived Dyspnea (Exercise) 3 -- -- -- --   Symptoms SOB none none -- none   Comments Results 2nd full week of exercise -- -- --   Duration -- Continue with 30 min of aerobic exercise without signs/symptoms of physical distress. Continue with 30 min of aerobic exercise without signs/symptoms of physical distress. -- Continue with 30 min of aerobic exercise without signs/symptoms of physical distress.   Intensity -- THRR unchanged THRR unchanged -- THRR unchanged     Progression   Progression -- Continue to progress workloads to maintain intensity without signs/symptoms of physical distress. Continue to progress workloads to maintain intensity without signs/symptoms of physical distress. -- Continue to progress workloads to maintain intensity without signs/symptoms of physical distress.   Average METs -- 3.95 3.9 -- 4.65     Resistance Training   Training Prescription -- Yes Yes -- Yes   Weight -- 3 lb 3 lb -- 5 lb   Reps -- 10-15 10-15 -- 10-15     Interval Training   Interval Training -- No No -- Yes   Equipment -- -- -- -- REL-XR   Comments -- -- -- -- Level 6- 14     Treadmill   MPH -- 2.5 2.7 -- 3   Grade -- 5 2.5 -- 3   Minutes -- 15 15 -- 15   METs -- 4.64 4 -- 4.54     NuStep   Level --  5 -- -- --   Minutes -- 15 -- -- --   METs -- 5.3 -- -- --     Recumbant Elliptical   Level -- 3 5 -- 3.5   Minutes -- 15 15 -- 15   METs -- 3.1 2.9 -- 3.3     REL-XR   Level -- 7 7 -- 10  intervals; level up to 14   Minutes -- 15 15 -- 15   METs -- 4.4 6.8 -- 8.5     Home Exercise Plan   Plans to continue exercise at -- -- -- Home (comment)  Walking at home, looking into getting a gym membership Home (comment)  Walking at home, looking into getting a gym membership  Frequency -- -- -- Add 2 additional days to program exercise sessions. Add 2 additional days to program exercise sessions.   Initial Home Exercises Provided -- -- -- 09/25/22 09/25/22     Oxygen   Maintain Oxygen Saturation -- 88% or higher 88% or higher 88% or higher 88% or higher    Row Name 10/14/22 1400             Response to Exercise   Blood Pressure (Admit) 128/64       Blood Pressure (Exit) 86/56       Heart Rate (Admit) 68 bpm       Heart Rate (Exercise) 111 bpm       Heart Rate (Exit) 85 bpm       Rating of Perceived Exertion (Exercise) 15       Symptoms none       Duration Continue with 30 min of aerobic exercise without signs/symptoms of physical distress.       Intensity THRR unchanged         Progression   Progression Continue to progress workloads to maintain intensity without signs/symptoms of physical distress.       Average METs 4.75         Resistance Training   Training Prescription Yes       Weight 5 lb       Reps 10-15         Interval Training   Interval Training No         Treadmill   MPH 3.1       Grade 4       Minutes 15       METs 5.08         Recumbant Elliptical   Level 3       Minutes 15       METs 3.4         Elliptical   Level 3       Speed 1       Minutes 5         REL-XR   Level 6       Minutes 15       METs 7.6         Home Exercise Plan   Plans to continue exercise at Home (comment)  Walking at home, looking into getting a gym membership        Frequency Add 2 additional days to program exercise sessions.       Initial Home Exercises Provided 09/25/22         Oxygen   Maintain Oxygen Saturation 88% or higher                Exercise Comments:   Exercise Comments     Row Name 08/19/22 0936           Exercise Comments First full day of exercise!  Patient was oriented to gym and equipment including functions, settings, policies, and procedures.  Patient's individual exercise prescription and treatment plan were reviewed.  All starting workloads were established based on the results of the 6 minute walk test done at initial orientation visit.  The plan for exercise progression was also introduced and progression will be customized based on patient's performance and goals.                Exercise Goals and Review:   Exercise Goals     Row Name 08/13/22 [phone]  Exercise Goals   Increase Physical Activity Yes       Intervention Provide advice, education, support and counseling about physical activity/exercise needs.;Develop an individualized exercise prescription for aerobic and resistive training based on initial evaluation findings, risk stratification, comorbidities and participant's personal goals.       Expected Outcomes Short Term: Attend rehab on a regular basis to increase amount of physical activity.;Long Term: Exercising regularly at least 3-5 days a week.;Long Term: Add in home exercise to make exercise part of routine and to increase amount of physical activity.       Increase Strength and Stamina Yes       Intervention Provide advice, education, support and counseling about physical activity/exercise needs.;Develop an individualized exercise prescription for aerobic and resistive training based on initial evaluation findings, risk stratification, comorbidities and participant's personal goals.       Expected Outcomes Short Term: Increase workloads from initial exercise prescription for resistance,  speed, and METs.;Short Term: Perform resistance training exercises routinely during rehab and add in resistance training at home;Long Term: Improve cardiorespiratory fitness, muscular endurance and strength as measured by increased METs and functional capacity ( )       Able to understand and use rate of perceived exertion (RPE) scale Yes       Intervention Provide education and explanation on how to use RPE scale       Expected Outcomes Short Term: Able to use RPE daily in rehab to express subjective intensity level;Long Term:  Able to use RPE to guide intensity level when exercising independently       Able to understand and use Dyspnea scale Yes       Intervention Provide education and explanation on how to use Dyspnea scale       Expected Outcomes Short Term: Able to use Dyspnea scale daily in rehab to express subjective sense of shortness of breath during exertion;Long Term: Able to use Dyspnea scale to guide intensity level when exercising independently       Knowledge and understanding of Target Heart Rate Range (THRR) Yes       Intervention Provide education and explanation of THRR including how the numbers were predicted and where they are located for reference       Expected Outcomes Short Term: Able to state/look up THRR;Long Term: Able to use THRR to govern intensity when exercising independently;Short Term: Able to use daily as guideline for intensity in rehab       Able to check pulse independently Yes       Intervention Provide education and demonstration on how to check pulse in carotid and radial arteries.;Review the importance of being able to check your own pulse for safety during independent exercise       Expected Outcomes Short Term: Able to explain why pulse checking is important during independent exercise;Long Term: Able to check pulse independently and accurately       Understanding of Exercise Prescription Yes       Intervention Provide education, explanation, and written  materials on patient's individual exercise prescription       Expected Outcomes Long Term: Able to explain home exercise prescription to exercise independently;Short Term: Able to explain program exercise prescription                Exercise Goals Re-Evaluation :  Exercise Goals Re-Evaluation     Row Name 08/19/22 0936 09/02/22 1340 09/17/22 0902 09/25/22 0946 09/30/22 1208     Exercise Goal Re-Evaluation  Exercise Goals Review Able to understand and use rate of perceived exertion (RPE) scale;Able to understand and use Dyspnea scale;Knowledge and understanding of Target Heart Rate Range (THRR);Understanding of Exercise Prescription Increase Physical Activity;Increase Strength and Stamina;Understanding of Exercise Prescription Increase Physical Activity;Increase Strength and Stamina;Understanding of Exercise Prescription Increase Physical Activity;Increase Strength and Stamina;Understanding of Exercise Prescription;Able to understand and use rate of perceived exertion (RPE) scale;Knowledge and understanding of Target Heart Rate Range (THRR);Able to understand and use Dyspnea scale;Able to check pulse independently Increase Physical Activity;Increase Strength and Stamina;Understanding of Exercise Prescription   Comments Reviewed RPE scale, THR and program prescription with pt today.  Pt voiced understanding and was given a copy of goals to take home. [name] has been doing well for the first couple of weeks he has been at rehab. He has significantly increased on all of his exercises, including the treadmill, XR, REL, and T4. He has been working an average of 4+ METS on each machine with appropriate RPEs. He has been reaching his THR each session. We will continue to monitor as he progresses in the program. [name] continues to do well in the program. He has continued to work at an overall average MET level above 3.9 METs. He also improved to level 5 on the recumbent elliptical. He increased his treadmill  speed to 2.7 mph while maintaining a 2.5% incline as well. We will continue to monitor his progress in the program. Reviewed home exercise with pt today.  Pt plans to walk at home for exercise. He also is looking into getting a gym membership. Reviewed THR, pulse, RPE, sign and symptoms, pulse oximetery and when to call 911 or MD.  Also discussed weather considerations and indoor options.  Pt voiced understanding. [name] continues to do well in rehab. He has increased to a 3/3% workload on the treadmill. He also started intervals on the XR and is working up to level 14, working up to 8.5 METS! He also started using 5 lb for handweights.  He continues to reach his THR. We will continue to monitor.   Expected Outcomes Short: Use RPE daily to regulate intensity. Long: Follow program prescription in THR. Short: Continue to increase speed on treadmill gradually Long: Continue to increase overall MET level and strength Short: Continue to gradually increase treadmill workload. Long: Continue to improve strength and stamina. Short: Add in two days of exercise at home. Long: Continue to improve strength and stamina. Short: Continue intervals on XR, try on treadmill Long: Continue to increase overall MET level and stamina    Row Name 10/14/22 1424             Exercise Goal Re-Evaluation   Exercise Goals Review Increase Physical Activity;Increase Strength and Stamina;Understanding of Exercise Prescription       Comments [name] is doing well in rehab. He recently improved his overall average MET level to 4.75 METs. He also increased his treadmill workload to a speed of 3.1 mph and an incline of 4%, although he has been inconsistent. He started using the elliptical as well and was able to tolerate 9 minutes at level 1, and 5 minutes at level 3. We will continue to monitor his progress in the program.       Expected Outcomes Short: Stay consistent with increased treadmill workload. Long: Continue to improve strength and  stamina.                Discharge Exercise Prescription (Final Exercise Prescription Changes):  Exercise Prescription Changes -  10/14/22 1400       Response to Exercise   Blood Pressure (Admit) 128/64    Blood Pressure (Exit) 86/56    Heart Rate (Admit) 68 bpm    Heart Rate (Exercise) 111 bpm    Heart Rate (Exit) 85 bpm    Rating of Perceived Exertion (Exercise) 15    Symptoms none    Duration Continue with 30 min of aerobic exercise without signs/symptoms of physical distress.    Intensity THRR unchanged      Progression   Progression Continue to progress workloads to maintain intensity without signs/symptoms of physical distress.    Average METs 4.75      Resistance Training   Training Prescription Yes    Weight 5 lb    Reps 10-15      Interval Training   Interval Training No      Treadmill   MPH 3.1    Grade 4    Minutes 15    METs 5.08      Recumbant Elliptical   Level 3    Minutes 15    METs 3.4      Elliptical   Level 3    Speed 1    Minutes 5      REL-XR   Level 6    Minutes 15    METs 7.6      Home Exercise Plan   Plans to continue exercise at Home (comment)   Walking at home, looking into getting a gym membership   Frequency Add 2 additional days to program exercise sessions.    Initial Home Exercises Provided 09/25/22      Oxygen   Maintain Oxygen Saturation 88% or higher             Nutrition:  Target Goals: Understanding of nutrition guidelines, daily intake of sodium 1500mg , cholesterol 200mg , calories 30% from fat and 7% or less from saturated fats, daily to have 5 or more servings of fruits and vegetables.  Education: All About Nutrition: -Group instruction provided by verbal, written material, interactive activities, discussions, models, and posters to present general guidelines for heart healthy nutrition including fat, fiber, MyPlate, the role of sodium in heart healthy nutrition, utilization of the nutrition label, and  utilization of this knowledge for meal planning. Follow up email sent as well. Written material given at graduation. Flowsheet Row Cardiac Rehab from 10/09/2022 in [facility] Cardiac and Pulmonary Rehab  Education need identified 08/13/22  Date 10/09/22  Educator [facility]  Instruction Review Code 1- Verbalizes Understanding       Biometrics:  Pre Biometrics - 08/13/22 0950       Pre Biometrics   Height 6' 1.5" (1.867 m)    Weight 200 lb 1.6 oz (90.8 kg)    Waist Circumference 37.5 inches    Hip Circumference 38 inches    Waist to Hip Ratio 0.99 %    BMI (Calculated) 26.04    Single Leg Stand 3.8 seconds              Nutrition Therapy Plan and Nutrition Goals:  Nutrition Therapy & Goals - 09/18/22 0825       Nutrition Therapy   Diet Heart healthy, low Na, T2DM MNT, MNT for altered taste    Drug/Food Interactions Statins/Certain Fruits    Protein (specify units) 100-110g    Fiber 30 grams    Whole Grain Foods 3 servings    Saturated Fats 16 max. grams    Fruits and Vegetables  8 servings/day    Sodium 2 grams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 signs/symptoms and action to take for hypo/hyperglycemia.;Provide education about proper nutrition, including hydration, and  aerobic/resistive exercise prescription along with prescribed medications to achieve blood glucose in normal ranges: Fasting glucose 65-99 mg/dL    Expected Outcomes Short Term: Participant verbalizes understanding of the signs/symptoms and immediate care of hyper/hypoglycemia, proper foot care and importance of medication, aerobic/resistive exercise and nutrition plan for blood glucose control.;Long Term: Attainment of HbA1C < 7%.    Hypertension Yes    Intervention Provide education on lifestyle modifcations including regular physical activity/exercise, weight management, moderate sodium restriction and increased consumption of fresh fruit, vegetables, and low fat dairy, alcohol moderation, and smoking cessation.;Monitor prescription use compliance.    Expected Outcomes Short Term: Continued assessment and intervention until BP is < 140/31mm HG in hypertensive participants. < 130/34mm HG in hypertensive participants with diabetes, heart failure or chronic kidney disease.;Long Term: Maintenance  of blood pressure at goal levels.    Lipids Yes    Intervention Provide education and support for participant on nutrition & aerobic/resistive exercise along with prescribed medications to achieve LDL 70mg , HDL >40mg .    Expected Outcomes Short Term: Participant states understanding of desired cholesterol values and is compliant with medications prescribed. Participant is following exercise prescription and nutrition guidelines.;Long Term: Cholesterol controlled with medications as prescribed, with individualized exercise RX and with personalized nutrition plan. Value goals: LDL < 70mg , HDL > 40 mg.             Education:Diabetes - Individual verbal and written instruction to review signs/symptoms of diabetes, desired ranges of glucose level fasting, after meals and with exercise. Acknowledge that pre and post exercise glucose checks will be done for 3 sessions at entry of program. Flowsheet Row Cardiac  Rehab from 10/09/2022 in [facility] Cardiac and Pulmonary Rehab  Education need identified 08/13/22       Core Components/Risk Factors/Patient Goals Review:   Goals and Risk Factor Review     Row Name 09/06/22 0936 09/25/22 0957           Core Components/Risk Factors/Patient Goals Review   Personal Goals Review Other Diabetes;Weight Management/Obesity;Hypertension;Other      Review [name] states that Flonnie Overman is making him short of breath and cannot take it., He is working with his doctor to find a different medication that works for him. [name] states that he is happy with where his weight is at as of now. He plans to maintain his weight between 190-200 lbs. He also states issues with his appetite and sleep regarding his medication. He was encouraged to talk to his doctor about those issues. He has not been checking his BP at home but states he does have a BP cuff. He also has not been checking his blood sugars but was encouraged to do so.      Expected Outcomes Short: talk to doctor about different medication oiptions. Long: take medications routinely. Short: begin to check BP and blood sugars at home. Long: Continue to monitor lifestyle risk factors.               Core Components/Risk Factors/Patient Goals at Discharge (Final Review):   Goals and Risk Factor Review - 09/25/22 0957       Core Components/Risk Factors/Patient Goals Review   Personal Goals Review Diabetes;Weight Management/Obesity;Hypertension;Other    Review [name] states that he is happy with where his weight is at as of now. He plans to maintain his weight between 190-200 lbs. He also states issues with his appetite and sleep regarding his medication. He was encouraged to talk to his doctor about those issues. He has not been checking his BP at home but states he does have a BP cuff. He also has not been checking his blood sugars but was encouraged to do so.    Expected Outcomes Short: begin to check BP and blood sugars at  home. Long: Continue to monitor lifestyle risk factors.             ITP Comments:  ITP Comments     Row Name 08/12/22 1613 08/13/22 0933 08/19/22 0936 08/21/22 1426 09/18/22 0915   ITP Comments Virtual orientation call completed today. he has an appointment on Date: 0213/2024  for EP eval and gym Orientation.  Documentation of diagnosis [facility] be found in [facility] Date: [id number] . Completed and gym orientation. Initial ITP created and sent for review to Dr. Loraine Leriche  Hyacinth Meeker, Medical Director. First full day of exercise!  Patient was oriented to gym and equipment including functions, settings, policies, and procedures.  Patient's individual exercise prescription and treatment plan were reviewed.  All starting workloads were established based on the results of the 6 minute walk test done at initial orientation visit.  The plan for exercise progression was also introduced and progression will be customized based on patient's performance and goals. 30 day review completed. ITP sent to Dr. Bethann Punches, Medical Director of Cardiac Rehab. Continue with ITP unless changes are made by physician. New to rehab, first day 08/19/22. 30 Day review completed. Medical Director ITP review done, changes made as directed, and signed approval by Medical Director.    Row Name 09/18/22 1017 10/16/22 1331         ITP Comments Completed initial RD consultation 30 day review completed. ITP sent to Dr. Bethann Punches, Medical Director of Cardiac Rehab. Continue with ITP unless changes are made by physician.               Comments: 30 day review

## 2022-10-18 DIAGNOSIS — I214 Non-ST elevation (NSTEMI) myocardial infarction: Secondary | ICD-10-CM | POA: Diagnosis not present

## 2022-10-18 DIAGNOSIS — Z955 Presence of coronary angioplasty implant and graft: Secondary | ICD-10-CM

## 2022-10-18 NOTE — Progress Notes (Signed)
Daily Session Note  Patient Details  Name: Donald Hart MRN: [id number] Date of Birth: Feb 12, 1971 Referring Provider:   Flowsheet Row Cardiac Rehab from 08/13/2022 in [facility] Cardiac and Pulmonary Rehab  Referring Provider Dr. Lorine Bears, MD       Encounter Date: 10/18/2022  Check In:  Session Check In - 10/18/22 0922       Check-In   Supervising physician immediately available to respond to emergencies See telemetry face sheet for immediately available ER MD    Location [facility]-Cardiac & Pulmonary Rehab    Staff Present Harlene Ramus, RN, BSN;Joseph [place], RCP,RRT,BSRT;Jessica [place], [place], RCEP, CCRP, CCET    Virtual Visit No    Medication changes reported     No    Fall or balance concerns reported    No    Tobacco Cessation No Change    Warm-up and Cool-down Performed on first and last piece of equipment    Resistance Training Performed Yes    VAD Patient? No    PAD/SET Patient? No      Pain Assessment   Currently in Pain? No/denies                Social History   Tobacco Use  Smoking Status Former   Packs/day: 1.00   Years: 20.00   Additional pack years: 0.00   Total pack years: 20.00   Types: Cigarettes   Quit date: 07/23/2022   Years since quitting: 0.2  Smokeless Tobacco Never  Tobacco Comments   Currently -ano cigarettes since heart attack vapes regularly throughout the week.  Wants to quit    Goals Met:  Independence with exercise equipment Exercise tolerated well No report of concerns or symptoms today Strength training completed today  Goals Unmet:  Not Applicable  Comments: Pt able to follow exercise prescription today without complaint.  Will continue to monitor for progression.   Dr. Bethann Punches is Medical Director for [facility] Cardiac Rehabilitation.  Dr. Vida Rigger is Medical Director for [facility] Pulmonary Rehabilitation.

## 2022-10-21 ENCOUNTER — Encounter: Payer: Medicaid Other | Admitting: *Deleted

## 2022-10-21 DIAGNOSIS — I214 Non-ST elevation (NSTEMI) myocardial infarction: Secondary | ICD-10-CM | POA: Diagnosis not present

## 2022-10-21 DIAGNOSIS — Z955 Presence of coronary angioplasty implant and graft: Secondary | ICD-10-CM

## 2022-10-21 NOTE — Progress Notes (Signed)
Daily Session Note  Patient Details  Name: Donald Hart MRN: [id number] Date of Birth: 11-Mar-1971 Referring Provider:   Flowsheet Row Cardiac Rehab from 08/13/2022 in [facility] Cardiac and Pulmonary Rehab  Referring Provider Dr. Lorine Bears, MD       Encounter Date: 10/21/2022  Check In:  Session Check In - 10/21/22 4098       Check-In   Supervising physician immediately available to respond to emergencies See telemetry face sheet for immediately available ER MD    Location [facility]-Cardiac & Pulmonary Rehab    Staff Present Lanny Hurst, RN, Franki Monte, BS, ACSM CEP, Exercise Physiologist;Noah Tickle, BS, Exercise Physiologist    Virtual Visit No    Medication changes reported     No    Fall or balance concerns reported    No    Warm-up and Cool-down Performed on first and last piece of equipment    Resistance Training Performed Yes    VAD Patient? No    PAD/SET Patient? No      Pain Assessment   Currently in Pain? No/denies                Social History   Tobacco Use  Smoking Status Former   Packs/day: 1.00   Years: 20.00   Additional pack years: 0.00   Total pack years: 20.00   Types: Cigarettes   Quit date: 07/23/2022   Years since quitting: 0.2  Smokeless Tobacco Never  Tobacco Comments   Currently -ano cigarettes since heart attack vapes regularly throughout the week.  Wants to quit    Goals Met:  Independence with exercise equipment Exercise tolerated well No report of concerns or symptoms today Strength training completed today  Goals Unmet:  Not Applicable  Comments: Pt able to follow exercise prescription today without complaint.  Will continue to monitor for progression.    Dr. Bethann Punches is Medical Director for [facility] Cardiac Rehabilitation.  Dr. Vida Rigger is Medical Director for [facility] Pulmonary Rehabilitation.

## 2022-10-23 ENCOUNTER — Encounter: Payer: Medicaid Other | Admitting: *Deleted

## 2022-10-23 DIAGNOSIS — Z955 Presence of coronary angioplasty implant and graft: Secondary | ICD-10-CM

## 2022-10-23 DIAGNOSIS — I214 Non-ST elevation (NSTEMI) myocardial infarction: Secondary | ICD-10-CM | POA: Diagnosis not present

## 2022-10-23 NOTE — Progress Notes (Signed)
Daily Session Note  Patient Details  Name: Donald Hart MRN: [id number] Date of Birth: 04/01/1971 Referring Provider:   Flowsheet Row Cardiac Rehab from 08/13/2022 in [facility] Cardiac and Pulmonary Rehab  Referring Provider Dr. Lorine Bears, MD       Encounter Date: 10/23/2022  Check In:  Session Check In - 10/23/22 4098       Check-In   Supervising physician immediately available to respond to emergencies See telemetry face sheet for immediately available ER MD    Location [facility]-Cardiac & Pulmonary Rehab    Staff Present Lanny Hurst, RN, ADN;Joseph Reino [place], RCP,RRT,BSRT;Noah Tickle, BS, Exercise Physiologist    Virtual Visit No    Medication changes reported     No    Fall or balance concerns reported    No    Warm-up and Cool-down Performed on first and last piece of equipment    Resistance Training Performed Yes    VAD Patient? No    PAD/SET Patient? No      Pain Assessment   Currently in Pain? No/denies                Social History   Tobacco Use  Smoking Status Former   Packs/day: 1.00   Years: 20.00   Additional pack years: 0.00   Total pack years: 20.00   Types: Cigarettes   Quit date: 07/23/2022   Years since quitting: 0.2  Smokeless Tobacco Never  Tobacco Comments   Currently -ano cigarettes since heart attack vapes regularly throughout the week.  Wants to quit    Goals Met:  Independence with exercise equipment Exercise tolerated well No report of concerns or symptoms today Strength training completed today  Goals Unmet:  Not Applicable  Comments: Pt able to follow exercise prescription today without complaint.  Will continue to monitor for progression.    Dr. Bethann Punches is Medical Director for [facility] Cardiac Rehabilitation.  Dr. Vida Rigger is Medical Director for [facility] Pulmonary Rehabilitation.

## 2022-10-25 ENCOUNTER — Encounter: Payer: Medicaid Other | Admitting: *Deleted

## 2022-10-25 DIAGNOSIS — Z955 Presence of coronary angioplasty implant and graft: Secondary | ICD-10-CM

## 2022-10-25 DIAGNOSIS — I214 Non-ST elevation (NSTEMI) myocardial infarction: Secondary | ICD-10-CM

## 2022-10-25 NOTE — Progress Notes (Signed)
Daily Session Note  Patient Details  Name: Donald Hart MRN: [id number] Date of Birth: 15-Mar-1971 Referring Provider:   Flowsheet Row Cardiac Rehab from 08/13/2022 in [facility] Cardiac and Pulmonary Rehab  Referring Provider Dr. Lorine Bears, MD       Encounter Date: 10/25/2022  Check In:  Session Check In - 10/25/22 0937       Check-In   Supervising physician immediately available to respond to emergencies See telemetry face sheet for immediately available ER MD    Location [facility]-Cardiac & Pulmonary Rehab    Staff Present Bess Kinds RN, BSN;Susanne Bice, RN, BSN, CCRP;Other   Johnathan Hausen, BS & [place], HFS   Virtual Visit No    Medication changes reported     No    Fall or balance concerns reported    No    Tobacco Cessation No Change    Warm-up and Cool-down Performed on first and last piece of equipment    Resistance Training Performed Yes    VAD Patient? No    PAD/SET Patient? No      Pain Assessment   Currently in Pain? No/denies                Social History   Tobacco Use  Smoking Status Former   Packs/day: 1.00   Years: 20.00   Additional pack years: 0.00   Total pack years: 20.00   Types: Cigarettes   Quit date: 07/23/2022   Years since quitting: 0.2  Smokeless Tobacco Never  Tobacco Comments   Currently -ano cigarettes since heart attack vapes regularly throughout the week.  Wants to quit    Goals Met:  Independence with exercise equipment Exercise tolerated well No report of concerns or symptoms today Strength training completed today  Goals Unmet:  Not Applicable  Comments: Pt able to follow exercise prescription today without complaint.  Will continue to monitor for progression.    Dr. Bethann Punches is Medical Director for [facility] Cardiac Rehabilitation.  Dr. Vida Rigger is Medical Director for [facility] Pulmonary Rehabilitation.

## 2022-10-28 ENCOUNTER — Encounter: Payer: Medicaid Other | Admitting: *Deleted

## 2022-10-28 DIAGNOSIS — I214 Non-ST elevation (NSTEMI) myocardial infarction: Secondary | ICD-10-CM

## 2022-10-28 DIAGNOSIS — Z955 Presence of coronary angioplasty implant and graft: Secondary | ICD-10-CM

## 2022-10-28 NOTE — Progress Notes (Signed)
Daily Session Note  Patient Details  Name: Donald Hart MRN: [id number] Date of Birth: June 02, 1971 Referring Provider:   Flowsheet Row Cardiac Rehab from 08/13/2022 in [facility] Cardiac and Pulmonary Rehab  Referring Provider Dr. Lorine Bears, MD       Encounter Date: 10/28/2022  Check In:  Session Check In - 10/28/22 0921       Check-In   Supervising physician immediately available to respond to emergencies See telemetry face sheet for immediately available ER MD    Location [facility]-Cardiac & Pulmonary Rehab    Staff Present Lanny Hurst, RN, Franki Monte, BS, ACSM CEP, Exercise Physiologist;Noah Tickle, BS, Exercise Physiologist    Virtual Visit No    Medication changes reported     No    Fall or balance concerns reported    No    Warm-up and Cool-down Performed on first and last piece of equipment    Resistance Training Performed Yes    VAD Patient? No    PAD/SET Patient? No      Pain Assessment   Currently in Pain? No/denies                Social History   Tobacco Use  Smoking Status Former   Packs/day: 1.00   Years: 20.00   Additional pack years: 0.00   Total pack years: 20.00   Types: Cigarettes   Quit date: 07/23/2022   Years since quitting: 0.2  Smokeless Tobacco Never  Tobacco Comments   Currently -ano cigarettes since heart attack vapes regularly throughout the week.  Wants to quit    Goals Met:  Independence with exercise equipment Exercise tolerated well No report of concerns or symptoms today Strength training completed today  Goals Unmet:  Not Applicable  Comments: Pt able to follow exercise prescription today without complaint.  Will continue to monitor for progression.    Dr. Bethann Punches is Medical Director for [facility] Cardiac Rehabilitation.  Dr. Vida Rigger is Medical Director for [facility] Pulmonary Rehabilitation.

## 2022-10-30 ENCOUNTER — Encounter: Payer: Medicaid Other | Attending: Cardiovascular Disease | Admitting: *Deleted

## 2022-10-30 VITALS — Ht 73.5 in | Wt 203.6 lb

## 2022-10-30 DIAGNOSIS — I214 Non-ST elevation (NSTEMI) myocardial infarction: Secondary | ICD-10-CM | POA: Diagnosis not present

## 2022-10-30 DIAGNOSIS — Z955 Presence of coronary angioplasty implant and graft: Secondary | ICD-10-CM | POA: Diagnosis present

## 2022-10-30 NOTE — Progress Notes (Signed)
Daily Session Note  Patient Details  Name: Donald Hart MRN: [id number] Date of Birth: 12-Jan-1971 Referring Provider:   Flowsheet Row Cardiac Rehab from 08/13/2022 in [facility] Cardiac and Pulmonary Rehab  Referring Provider Dr. Lorine Bears, MD       Encounter Date: 10/30/2022  Check In:  Session Check In - 10/30/22 0917       Check-In   Supervising physician immediately available to respond to emergencies See telemetry face sheet for immediately available ER MD    Location [facility]-Cardiac & Pulmonary Rehab    Staff Present Cora Collum, RN, BSN, CCRP;Melissa [place] MS, RDN, LDN;Noah Tickle, BS, Exercise Physiologist;Judiann Celia [place], MA, RCEP, CCRP, CCET    Virtual Visit No    Medication changes reported     No    Fall or balance concerns reported    No    Tobacco Cessation No Change    Warm-up and Cool-down Performed on first and last piece of equipment    Resistance Training Performed Yes    VAD Patient? No    PAD/SET Patient? No      Pain Assessment   Currently in Pain? No/denies                Social History   Tobacco Use  Smoking Status Former   Packs/day: 1.00   Years: 20.00   Additional pack years: 0.00   Total pack years: 20.00   Types: Cigarettes   Quit date: 07/23/2022   Years since quitting: 0.2  Smokeless Tobacco Never  Tobacco Comments   Currently -ano cigarettes since heart attack vapes regularly throughout the week.  Wants to quit    Goals Met:  Proper associated with RPD/PD & O2 Sat Independence with exercise equipment Exercise tolerated well No report of concerns or symptoms today Strength training completed today  Goals Unmet:  Not Applicable  Comments: Pt able to follow exercise prescription today without complaint.  Will continue to monitor for progression.    Dr. Bethann Punches is Medical Director for [facility] Cardiac Rehabilitation.  Dr. Vida Rigger is Medical Director for [facility] Pulmonary Rehabilitation.

## 2022-10-30 NOTE — Patient Instructions (Signed)
Discharge Patient Instructions  Patient Details  Name: Donald Hart MRN: [id number] Date of Birth: December 11, 1970 Referring Provider:  [facility]   Number of Visits: 36  Reason for Discharge:  Patient reached a stable level of exercise. Patient independent in their exercise. Patient has met program and personal goals.  Diagnosis:  NSTEMI (non-ST elevation myocardial infarction) [facility])  Status post coronary artery stent placement  Initial Exercise Prescription:  Initial Exercise Prescription - 08/13/22 0900       Date of Initial Exercise RX and Referring Provider   Date 08/13/22    Referring Provider Dr. Lorine Bears, MD      Oxygen   Maintain Oxygen Saturation 88% or higher      Treadmill   MPH 1.8    Grade 0.5    Minutes 15    METs 2.5      NuStep   Level 2    SPM 80    Minutes 15    METs 4.06      REL-XR   Level 1    Speed 50    Minutes 15    METs 4.06      Prescription Details   Frequency (times per week) 3    Duration Progress to 30 minutes of continuous aerobic without signs/symptoms of physical distress      Intensity   THRR 40-80% of Max Heartrate 118-152    Ratings of Perceived Exertion 11-13    Perceived Dyspnea 0-4      Progression   Progression Continue to progress workloads to maintain intensity without signs/symptoms of physical distress.      Resistance Training   Training Prescription Yes    Weight 3 lb    Reps 10-15             Discharge Exercise Prescription (Final Exercise Prescription Changes):  Exercise Prescription Changes - 10/28/22 1400       Response to Exercise   Blood Pressure (Admit) 108/56    Blood Pressure (Exit) 118/68    Heart Rate (Admit) 68 bpm    Heart Rate (Exercise) 97 bpm    Heart Rate (Exit) 86 bpm    Rating of Perceived Exertion (Exercise) 15    Symptoms none    Duration Continue with 30 min of aerobic exercise without signs/symptoms of physical distress.    Intensity THRR  unchanged      Progression   Progression Continue to progress workloads to maintain intensity without signs/symptoms of physical distress.    Average METs 4.48      Resistance Training   Training Prescription Yes    Weight 5 lb    Reps 10-15      Interval Training   Interval Training No      Treadmill   MPH 3.3    Grade 3.5    Minutes 15    METs 5.12      Recumbant Elliptical   Level 3.5    Minutes 15    METs 3.5      Elliptical   Level 1    Minutes 5      REL-XR   Level 7    Minutes 15      Home Exercise Plan   Plans to continue exercise at Home (comment)   Walking at home, looking into getting a gym membership   Frequency Add 2 additional days to program exercise sessions.    Initial Home Exercises Provided 09/25/22      Oxygen  Maintain Oxygen Saturation 88% or higher             Functional Capacity:  6 Minute Walk     Row Name 08/13/22 0947 10/30/22 0938       6 Minute Walk   Phase Initial Discharge    Distance 1165 feet 1575 feet    Distance % Change -- 35.2 %    Distance Feet Change -- 410 ft    Walk Time 6 minutes 6 minutes    # of Rest Breaks 0 0    MPH 2.21 2.98    METS 4.06 4.58    RPE 13 11    Perceived Dyspnea  3 0    VO2 Peak 14.2 16.02    Symptoms Yes (comment) No    Comments SOB --    Resting HR 84 bpm 66 bpm    Resting BP 124/68 120/70    Resting Oxygen Saturation  98 % 98 %    Exercise Oxygen Saturation  during 6 min walk 97 % 96 %    Max Ex. HR 105 bpm 93 bpm    Max Ex. BP 138/72 132/70    2 Minute Post BP 94/56 --            Nutrition & Weight - Outcomes:  Pre Biometrics - 08/13/22 0950       Pre Biometrics   Height 6' 1.5" (1.867 m)    Weight 200 lb 1.6 oz (90.8 kg)    Waist Circumference 37.5 inches    Hip Circumference 38 inches    Waist to Hip Ratio 0.99 %    BMI (Calculated) 26.04    Single Leg Stand 3.8 seconds             Post Biometrics - 10/30/22 0940        Post  Biometrics   Height 6'  1.5" (1.867 m)    Weight 203 lb 9.6 oz (92.4 kg)    Waist Circumference 35.5 inches    Hip Circumference 38 inches    Waist to Hip Ratio 0.93 %    BMI (Calculated) 26.49    Single Leg Stand 12.4 seconds             Nutrition:  Nutrition Therapy & Goals - 09/18/22 0825       Nutrition Therapy   Diet Heart healthy, low Na, T2DM MNT, MNT for altered taste    Drug/Food Interactions Statins/Certain Fruits    Protein (specify units) 100-110g    Fiber 30 grams    Whole Grain Foods 3 servings    Saturated Fats 16 max. grams    Fruits and Vegetables 8 servings/day    Sodium 2 grams      Personal Nutrition Goals   Nutrition Goal ST: Suggested using plastic utensils, utilizing citrus like lemon, utilizing spices (cinnamon, clove, fennel seed, allspice, black pepper, ginger, peppermint, and horseradish), eating the foods he can tolerate during the day, and experimenting with new foods or recipes (can make a nutrient dense smoothie with spinach, fruit, milk, and protein/healthy fat from peanut butter or greek yogurt for example as well as a chia seed "pudding" that can add additional fiber, fat, and protein), and possible supplementing with a nutritional shake (ensure complete). LT: eat consistently during the day while managing altered taste    Comments 52 y.o. M admitted to rehab s/p NSTEMI with stent placement. PMHx includes T2DM, previous alcohol use, current tobacco use, HTN, HLD, gout, chronic back pain,  GERD. Reviewed relevant medications lipitor, colace, jardiance, nicotine, omeprazole, MVI with minerals, mag-ox, omega-3 FAs, folic acid. Labs reviewed: A1C 6.6 % (07/23/22). Donald Hart reports only eating a night around 7pm. He reports that food does not taste right anymore since COVID, he still feels hungry, but he feels that he can only eat a couple of bites of food as he feels like he might throw up; food tastes like garbage and metallic. Donald Hart also reports struggling with nausea during the day  - this could be due to hunger as well. Donald Hart reports that sweet flavors go over well and he can tolerate fruit, yogurt, peanut butter, corn, peas, cucumbers, green beans, wraps, meat, and chicken. He reports that his weight stays around 200lb and has had no significant weight changes. D:sandwich, hamburger Drinks: gatoraide or water or [place] peach tea. He used to drink soda, but has since cut back. Donald Hart reports he should cut down on sugar-sweetened beverages and eat more health promoting food; discussed how first it is important to address his relationship with food and ensure he is getting adequate nutrition. He is looking for a new primary care doctor as of right now and has not mentioned it to his doctor; encouraged him to let his doctor know as they may be able to provide interventions that will help. Suggested using plastic utensils, utilizing citrus like lemon, utilizing spices (cinnamon, clove, fennel seed, allspice, black pepper, ginger, peppermint, and horseradish), eating the foods he can tolerate during the day, and experimenting with new foods or recipes (can make a nutrient dense smoothie with spinach, fruit, milk, and protein/healthy fat from peanut butter or greek yogurt for example as well as a chia seed "pudding" that can add additional fiber, fat, and protein), and possible supplementing with a nutritional shake (ensure complete).      Intervention Plan   Intervention Prescribe, educate and counsel regarding individualized specific dietary modifications aiming towards targeted core components such as weight, hypertension, lipid management, diabetes, heart failure and other comorbidities.    Expected Outcomes Short Term Goal: Understand basic principles of dietary content, such as calories, fat, sodium, cholesterol and nutrients.;Short Term Goal: A plan has been developed with personal nutrition goals set during dietitian appointment.;Long Term Goal: Adherence to prescribed nutrition plan.

## 2022-11-04 ENCOUNTER — Encounter: Payer: Medicaid Other | Admitting: *Deleted

## 2022-11-04 DIAGNOSIS — I214 Non-ST elevation (NSTEMI) myocardial infarction: Secondary | ICD-10-CM

## 2022-11-04 DIAGNOSIS — Z955 Presence of coronary angioplasty implant and graft: Secondary | ICD-10-CM

## 2022-11-04 NOTE — Progress Notes (Signed)
Daily Session Note  Patient Details  Name: Donald Hart MRN: [id number] Date of Birth: 05-16-1971 Referring Provider:   Flowsheet Row Cardiac Rehab from 08/13/2022 in [facility] Cardiac and Pulmonary Rehab  Referring Provider Dr. Lorine Bears, MD       Encounter Date: 11/04/2022  Check In:  Session Check In - 11/04/22 0924       Check-In   Supervising physician immediately available to respond to emergencies See telemetry face sheet for immediately available ER MD    Location [facility]-Cardiac & Pulmonary Rehab    Staff Present Lanny Hurst, RN, Franki Monte, BS, ACSM CEP, Exercise Physiologist;Noah Tickle, BS, Exercise Physiologist    Virtual Visit No    Medication changes reported     No    Fall or balance concerns reported    No    Warm-up and Cool-down Performed on first and last piece of equipment    Resistance Training Performed Yes    VAD Patient? No    PAD/SET Patient? No      Pain Assessment   Currently in Pain? No/denies                Social History   Tobacco Use  Smoking Status Former   Packs/day: 1.00   Years: 20.00   Additional pack years: 0.00   Total pack years: 20.00   Types: Cigarettes   Quit date: 07/23/2022   Years since quitting: 0.2  Smokeless Tobacco Never  Tobacco Comments   Currently -ano cigarettes since heart attack vapes regularly throughout the week.  Wants to quit    Goals Met:  Independence with exercise equipment Exercise tolerated well No report of concerns or symptoms today Strength training completed today  Goals Unmet:  Not Applicable  Comments: Pt able to follow exercise prescription today without complaint.  Will continue to monitor for progression.    Dr. Bethann Punches is Medical Director for [facility] Cardiac Rehabilitation.  Dr. Vida Rigger is Medical Director for [facility] Pulmonary Rehabilitation.

## 2022-11-06 ENCOUNTER — Encounter: Payer: Medicaid Other | Admitting: *Deleted

## 2022-11-06 DIAGNOSIS — I214 Non-ST elevation (NSTEMI) myocardial infarction: Secondary | ICD-10-CM

## 2022-11-06 DIAGNOSIS — Z955 Presence of coronary angioplasty implant and graft: Secondary | ICD-10-CM

## 2022-11-06 NOTE — Progress Notes (Signed)
Daily Session Note  Patient Details  Name: Donald Hart MRN: [id number] Date of Birth: [birth year] Referring Provider:   Flowsheet Row Cardiac Rehab from 08/13/2022 in [facility] Cardiac and Pulmonary Rehab  Referring Provider Dr. Lorine Bears, MD       Encounter Date: 11/06/2022  Check In:  Session Check In - 11/06/22 0925       Check-In   Supervising physician immediately available to respond to emergencies See telemetry face sheet for immediately available ER MD    Location [facility]-Cardiac & Pulmonary Rehab    Staff Present Lanny Hurst, RN, ADN;Laureen Manson Passey, BS, RRT, CPFT;Noah Tickle, BS, Exercise Physiologist    Virtual Visit No    Medication changes reported     No    Fall or balance concerns reported    No    Warm-up and Cool-down Performed on first and last piece of equipment    Resistance Training Performed Yes    VAD Patient? No    PAD/SET Patient? No      Pain Assessment   Currently in Pain? No/denies                Social History   Tobacco Use  Smoking Status Former   Packs/day: 1.00   Years: 20.00   Additional pack years: 0.00   Total pack years: 20.00   Types: Cigarettes   Quit date: 07/23/2022   Years since quitting: 0.2  Smokeless Tobacco Never  Tobacco Comments   Currently -ano cigarettes since heart attack vapes regularly throughout the week.  Wants to quit    Goals Met:  Independence with exercise equipment Exercise tolerated well No report of concerns or symptoms today Strength training completed today  Goals Unmet:  Not Applicable  Comments: Pt able to follow exercise prescription today without complaint.  Will continue to monitor for progression.    Dr. Bethann Punches is Medical Director for [facility] Cardiac Rehabilitation.  Dr. Vida Rigger is Medical Director for [facility] Pulmonary Rehabilitation.

## 2022-11-08 ENCOUNTER — Encounter: Payer: Medicaid Other | Admitting: *Deleted

## 2022-11-08 DIAGNOSIS — I214 Non-ST elevation (NSTEMI) myocardial infarction: Secondary | ICD-10-CM | POA: Diagnosis not present

## 2022-11-08 DIAGNOSIS — Z955 Presence of coronary angioplasty implant and graft: Secondary | ICD-10-CM

## 2022-11-08 NOTE — Progress Notes (Signed)
Cardiac Individual Treatment Plan  Patient Details  Name: Donald Hart MRN: [id number] Date of Birth: 04-20-1971 Referring Provider:   Flowsheet Row Cardiac Rehab from 08/13/2022 in [facility] Cardiac and Pulmonary Rehab  Referring Provider Dr. Lorine Bears, MD       Initial Encounter Date:  Flowsheet Row Cardiac Rehab from 08/13/2022 in [facility] Cardiac and Pulmonary Rehab  Date 08/13/22       Visit Diagnosis: NSTEMI (non-ST elevation myocardial infarction) [facility])  Status post coronary artery stent placement  Patient's Home Medications on Admission:  Current Outpatient Medications:    albuterol (PROVENTIL HFA;VENTOLIN HFA) 108 (90 Base) MCG/ACT inhaler, Inhale 1-2 puffs every 6 (six) hours as needed into the lungs for wheezing or shortness of breath. (Patient not taking: Reported on 09/18/2022), Disp: 1 Inhaler, Rfl: 0   aspirin EC 81 MG tablet, Take 1 tablet (81 mg total) by mouth daily. Swallow whole., Disp: 30 tablet, Rfl: 12   atorvastatin (LIPITOR) 80 MG tablet, Take 80 mg by mouth daily., Disp: , Rfl:    carvedilol (COREG) 25 MG tablet, Take 1 tablet (25 mg total) by mouth 2 (two) times daily., Disp: 180 tablet, Rfl: 3   clopidogrel (PLAVIX) 75 MG tablet, Take 1 tablet (75 mg total) by mouth daily., Disp: 90 tablet, Rfl: 3   colchicine 0.6 MG tablet, Take 0.6 mg by mouth daily as needed. (Patient not taking: Reported on 07/31/2022), Disp: , Rfl:    docusate sodium (COLACE) 100 MG capsule, Take 1 capsule (100 mg total) by mouth 2 (two) times daily as needed for mild constipation. (Patient not taking: Reported on 09/18/2022), Disp: 10 capsule, Rfl: 0   fenofibrate 160 MG tablet, Take 1 tablet (160 mg total) by mouth daily. (Patient taking differently: Take 145 mg by mouth daily.), Disp: 30 tablet, Rfl: 0   folic acid (FOLVITE) 1 MG tablet, Take 1 mg by mouth daily. (Patient not taking: Reported on 09/18/2022), Disp: , Rfl:    gabapentin (NEURONTIN) 800 MG tablet, Take 800 mg by  mouth 3 (three) times daily., Disp: , Rfl:    hydrochlorothiazide (HYDRODIURIL) 50 MG tablet, Take 50 mg by mouth daily., Disp: , Rfl:    hydrOXYzine (ATARAX) 10 MG tablet, Take 10 mg by mouth 3 (three) times daily., Disp: , Rfl:    hyoscyamine (LEVSIN, ANASPAZ) 0.125 MG tablet, Take 1 tablet (0.125 mg total) by mouth every 6 (six) hours as needed for cramping. (Patient not taking: Reported on 09/18/2022), Disp: 30 tablet, Rfl: 0   JARDIANCE 25 MG TABS tablet, Take 25 mg by mouth daily., Disp: , Rfl:    losartan (COZAAR) 100 MG tablet, Take 1 tablet (100 mg total) by mouth daily., Disp: 30 tablet, Rfl: 0   magnesium oxide (MAG-OX) 400 MG tablet, Take 400 mg by mouth daily. (Patient not taking: Reported on 09/18/2022), Disp: , Rfl:    Multiple Vitamin (MULTIVITAMIN WITH MINERALS) TABS tablet, Take 1 tablet by mouth daily. (Patient not taking: Reported on 09/18/2022), Disp: , Rfl:    nicotine (NICODERM CQ - DOSED IN MG/24 HOURS) 21 mg/24hr patch, Place 1 patch (21 mg total) onto the skin daily. (Patient not taking: Reported on 07/31/2022), Disp: 28 patch, Rfl: 0   omega-3 acid ethyl esters (LOVAZA) 1 g capsule, Take 2 g by mouth 2 (two) times daily. (Patient not taking: Reported on 09/18/2022), Disp: , Rfl:    omeprazole (PRILOSEC) 20 MG capsule, Take 20 mg by mouth daily as needed., Disp: , Rfl:  spironolactone (ALDACTONE) 25 MG tablet, Take 25 mg by mouth every morning., Disp: , Rfl:    thiamine 100 MG tablet, Take 1 tablet (100 mg total) by mouth daily. (Patient not taking: Reported on 09/18/2022), Disp: , Rfl:   Past Medical History: Past Medical History:  Diagnosis Date   Acute pancreatitis 07/02/2015   hospitalization at [facility]   CAD (coronary artery disease)    a. 07/2022 NSTEMI/PCI: LM nl, LAD 73m, 50d, RI nl, LCX 37m (2.75x22 Onyx Frontier DES), RCA 20p/m, RPDA 30. EF 55-65%.   Chronic back pain    Diastolic dysfunction    a. 01/2016 Echo: EF 60-65%, GrI DD. Mild LVH, nl RV fxn, mildly dil LA;  b. 07/2022 Echo: EF 60-65%, no rwma, nl RV fxn.   Family history of diabetes mellitus    sister   Family history of heart attack    Father.    Family history of stomach cancer    Gout    Hypertension    Hypertriglyceridemia    Remote Alcohol abuse    Tobacco abuse    Type 2 diabetes mellitus (HCC) 07/02/2015    Tobacco Use: Social History   Tobacco Use  Smoking Status Former   Packs/day: 1.00   Years: 20.00   Additional pack years: 0.00   Total pack years: 20.00   Types: Cigarettes   Quit date: 07/23/2022   Years since quitting: 0.2  Smokeless Tobacco Never  Tobacco Comments   Currently -ano cigarettes since heart attack vapes regularly throughout the week.  Wants to quit    Labs: Review Flowsheet  More data exists      Latest Ref Rng & Units 09/27/2016 09/28/2016 09/29/2016 09/30/2016 07/23/2022  Labs for ITP Cardiac and Pulmonary Rehab  Cholestrol 0 - 200 mg/dL - - - - 161   LDL (calc) 0 - 99 mg/dL - - - - 33   HDL-C >09 mg/dL - - - - 27   Trlycerides <150 mg/dL 604  540  981  191  478  647  642  221   Hemoglobin A1c 4.8 - 5.6 % - - - - 6.6      Exercise Target Goals: Exercise Program Goal: Individual exercise prescription set using results from initial 6 min walk test and THRR while considering  patient's activity barriers and safety.   Exercise Prescription Goal: Initial exercise prescription builds to 30-45 minutes a day of aerobic activity, 2-3 days per week.  Home exercise guidelines will be given to patient during program as part of exercise prescription that the participant will acknowledge.   Education: Aerobic Exercise: - Group verbal and visual presentation on the components of exercise prescription. Introduces F.I.T.T principle from ACSM for exercise prescriptions.  Reviews F.I.T.T. principles of aerobic exercise including progression. Written material given at graduation. Flowsheet Row Cardiac Rehab from 11/06/2022 in [facility] Cardiac and Pulmonary Rehab   Education need identified 08/13/22  Date 09/25/22  Educator [facility]  Instruction Review Code 1- Verbalizes Understanding       Education: Resistance Exercise: - Group verbal and visual presentation on the components of exercise prescription. Introduces F.I.T.T principle from ACSM for exercise prescriptions  Reviews F.I.T.T. principles of resistance exercise including progression. Written material given at graduation.    Education: Exercise & Equipment Safety: - Individual verbal instruction and demonstration of equipment use and safety with use of the equipment. Flowsheet Row Cardiac Rehab from 11/06/2022 in [facility] Cardiac and Pulmonary Rehab  Date 08/13/22  Educator NT  Instruction Review  Code 1- Verbalizes Understanding       Education: Exercise Physiology & General Exercise Guidelines: - Group verbal and written instruction with models to review the exercise physiology of the cardiovascular system and associated critical values. Provides general exercise guidelines with specific guidelines to those with heart or lung disease.  Flowsheet Row Cardiac Rehab from 11/06/2022 in [facility] Cardiac and Pulmonary Rehab  Date 09/18/22  Educator [facility]  Instruction Review Code 1- Verbalizes Understanding       Education: Flexibility, Balance, Mind/Body Relaxation: - Group verbal and visual presentation with interactive activity on the components of exercise prescription. Introduces F.I.T.T principle from ACSM for exercise prescriptions. Reviews F.I.T.T. principles of flexibility and balance exercise training including progression. Also discusses the mind body connection.  Reviews various relaxation techniques to help reduce and manage stress (i.e. Deep breathing, progressive muscle relaxation, and visualization). Balance handout provided to take home. Written material given at graduation.   Activity Barriers & Risk Stratification:  Activity Barriers & Cardiac Risk Stratification - 08/13/22 0949        Activity Barriers & Cardiac Risk Stratification   Activity Barriers [name];Other (comment)    Comments chronic muscle aches, lower back problems, Hx broken left arm    Cardiac Risk Stratification High             6 Minute Walk:  6 Minute Walk     Row Name 08/13/22 0947 10/30/22 0938       6 Minute Walk   Phase Initial Discharge    Distance 1165 feet 1575 feet    Distance % Change -- 35.2 %    Distance Feet Change -- 410 ft    Walk Time 6 minutes 6 minutes    # of Rest Breaks 0 0    MPH 2.21 2.98    METS 4.06 4.58    RPE 13 11    Perceived Dyspnea  3 0    VO2 Peak 14.2 16.02    Symptoms Yes (comment) No    Comments SOB --    Resting HR 84 bpm 66 bpm    Resting BP 124/68 120/70    Resting Oxygen Saturation  98 % 98 %    Exercise Oxygen Saturation  during 6 min walk 97 % 96 %    Max Ex. HR 105 bpm 93 bpm    Max Ex. BP 138/72 132/70    2 Minute Post BP 94/56 --             Oxygen Initial Assessment:   Oxygen Re-Evaluation:   Oxygen Discharge (Final Oxygen Re-Evaluation):   Initial Exercise Prescription:  Initial Exercise Prescription - 08/13/22 0900       Date of Initial Exercise RX and Referring Provider   Date 08/13/22    Referring Provider Dr. Lorine Bears, MD      Oxygen   Maintain Oxygen Saturation 88% or higher      Treadmill   MPH 1.8    Grade 0.5    Minutes 15    METs 2.5      NuStep   Level 2    SPM 80    Minutes 15    METs 4.06      REL-XR   Level 1    Speed 50    Minutes 15    METs 4.06      Prescription Details   Frequency (times per week) 3    Duration Progress to 30 minutes of continuous aerobic without  signs/symptoms of physical distress      Intensity   THRR 40-80% of Max Heartrate 118-152    Ratings of Perceived Exertion 11-13    Perceived Dyspnea 0-4      Progression   Progression Continue to progress workloads to maintain intensity without signs/symptoms of physical distress.      Resistance  Training   Training Prescription Yes    Weight 3 lb    Reps 10-15             Perform Capillary Blood Glucose checks as needed.  Exercise Prescription Changes:   Exercise Prescription Changes     Row Name 08/13/22 0900 09/02/22 1300 09/17/22 0800 09/25/22 0900 09/30/22 1200     Response to Exercise   Blood Pressure (Admit) 124/68 122/74 102/68 -- 128/64   Blood Pressure (Exercise) 138/72 130/80 146/80 -- --   Blood Pressure (Exit) 94/56 118/76 100/56 -- 118/78   Heart Rate (Admit) 84 bpm 89 bpm 85 bpm -- 75 bpm   Heart Rate (Exercise) 105 bpm 152 bpm 137 bpm -- 113 bpm   Heart Rate (Exit) 80 bpm 117 bpm 74 bpm -- 86 bpm   Oxygen Saturation (Admit) 98 % -- -- -- --   Oxygen Saturation (Exercise) 97 % -- -- -- --   Oxygen Saturation (Exit) 100 % -- -- -- --   Rating of Perceived Exertion (Exercise) 13 13 11  -- 13   Perceived Dyspnea (Exercise) 3 -- -- -- --   Symptoms SOB none none -- none   Comments Results 2nd full week of exercise -- -- --   Duration -- Continue with 30 min of aerobic exercise without signs/symptoms of physical distress. Continue with 30 min of aerobic exercise without signs/symptoms of physical distress. -- Continue with 30 min of aerobic exercise without signs/symptoms of physical distress.   Intensity -- THRR unchanged THRR unchanged -- THRR unchanged     Progression   Progression -- Continue to progress workloads to maintain intensity without signs/symptoms of physical distress. Continue to progress workloads to maintain intensity without signs/symptoms of physical distress. -- Continue to progress workloads to maintain intensity without signs/symptoms of physical distress.   Average METs -- 3.95 3.9 -- 4.65     Resistance Training   Training Prescription -- Yes Yes -- Yes   Weight -- 3 lb 3 lb -- 5 lb   Reps -- 10-15 10-15 -- 10-15     Interval Training   Interval Training -- No No -- Yes   Equipment -- -- -- -- REL-XR   Comments -- -- -- --  Level 6- 14     Treadmill   MPH -- 2.5 2.7 -- 3   Grade -- 5 2.5 -- 3   Minutes -- 15 15 -- 15   METs -- 4.64 4 -- 4.54     NuStep   Level -- 5 -- -- --   Minutes -- 15 -- -- --   METs -- 5.3 -- -- --     Recumbant Elliptical   Level -- 3 5 -- 3.5   Minutes -- 15 15 -- 15   METs -- 3.1 2.9 -- 3.3     REL-XR   Level -- 7 7 -- 10  intervals; level up to 14   Minutes -- 15 15 -- 15   METs -- 4.4 6.8 -- 8.5     Home Exercise Plan   Plans to continue exercise at -- -- -- Home (  comment)  Walking at home, looking into getting a gym membership Home (comment)  Walking at home, looking into getting a gym membership   Frequency -- -- -- Add 2 additional days to program exercise sessions. Add 2 additional days to program exercise sessions.   Initial Home Exercises Provided -- -- -- 09/25/22 09/25/22     Oxygen   Maintain Oxygen Saturation -- 88% or higher 88% or higher 88% or higher 88% or higher    Row Name 10/14/22 1400 10/28/22 1400           Response to Exercise   Blood Pressure (Admit) 128/64 108/56      Blood Pressure (Exit) 86/56 118/68      Heart Rate (Admit) 68 bpm 68 bpm      Heart Rate (Exercise) 111 bpm 97 bpm      Heart Rate (Exit) 85 bpm 86 bpm      Rating of Perceived Exertion (Exercise) 15 15      Symptoms none none      Duration Continue with 30 min of aerobic exercise without signs/symptoms of physical distress. Continue with 30 min of aerobic exercise without signs/symptoms of physical distress.      Intensity THRR unchanged THRR unchanged        Progression   Progression Continue to progress workloads to maintain intensity without signs/symptoms of physical distress. Continue to progress workloads to maintain intensity without signs/symptoms of physical distress.      Average METs 4.75 4.48        Resistance Training   Training Prescription Yes Yes      Weight 5 lb 5 lb      Reps 10-15 10-15        Interval Training   Interval Training No No         Treadmill   MPH 3.1 3.3      Grade 4 3.5      Minutes 15 15      METs 5.08 5.12        Recumbant Elliptical   Level 3 3.5      Minutes 15 15      METs 3.4 3.5        Elliptical   Level 3 1      Speed 1 --      Minutes 5 5        REL-XR   Level 6 7      Minutes 15 15      METs 7.6 --        Home Exercise Plan   Plans to continue exercise at Home (comment)  Walking at home, looking into getting a gym membership Home (comment)  Walking at home, looking into getting a gym membership      Frequency Add 2 additional days to program exercise sessions. Add 2 additional days to program exercise sessions.      Initial Home Exercises Provided 09/25/22 09/25/22        Oxygen   Maintain Oxygen Saturation 88% or higher 88% or higher               Exercise Comments:   Exercise Comments     Row Name 08/19/22 0936 11/08/22 1009         Exercise Comments First full day of exercise!  Patient was oriented to gym and equipment including functions, settings, policies, and procedures.  Patient's individual exercise prescription and treatment plan were reviewed.  All starting workloads were established  based on the results of the 6 minute walk test done at initial orientation visit.  The plan for exercise progression was also introduced and progression will be customized based on patient's performance and goals. [place] graduated today from  rehab with 36 sessions completed.  Details of the patient's exercise prescription and what He needs to do in order to continue the prescription and progress were discussed with patient.  Patient was given a copy of prescription and goals.  Patient verbalized understanding.  Valiant plans to continue to exercise by exercising at home.               Exercise Goals and Review:   Exercise Goals     Row Name 08/13/22 0950             Exercise Goals   Increase Physical Activity Yes       Intervention Provide advice, education, support and  counseling about physical activity/exercise needs.;Develop an individualized exercise prescription for aerobic and resistive training based on initial evaluation findings, risk stratification, comorbidities and participant's personal goals.       Expected Outcomes Short Term: Attend rehab on a regular basis to increase amount of physical activity.;Long Term: Exercising regularly at least 3-5 days a week.;Long Term: Add in home exercise to make exercise part of routine and to increase amount of physical activity.       Increase Strength and Stamina Yes       Intervention Provide advice, education, support and counseling about physical activity/exercise needs.;Develop an individualized exercise prescription for aerobic and resistive training based on initial evaluation findings, risk stratification, comorbidities and participant's personal goals.       Expected Outcomes Short Term: Increase workloads from initial exercise prescription for resistance, speed, and METs.;Short Term: Perform resistance training exercises routinely during rehab and add in resistance training at home;Long Term: Improve cardiorespiratory fitness, muscular endurance and strength as measured by increased METs and functional capacity ( )       Able to understand and use rate of perceived exertion (RPE) scale Yes       Intervention Provide education and explanation on how to use RPE scale       Expected Outcomes Short Term: Able to use RPE daily in rehab to express subjective intensity level;Long Term:  Able to use RPE to guide intensity level when exercising independently       Able to understand and use Dyspnea scale Yes       Intervention Provide education and explanation on how to use Dyspnea scale       Expected Outcomes Short Term: Able to use Dyspnea scale daily in rehab to express subjective sense of shortness of breath during exertion;Long Term: Able to use Dyspnea scale to guide intensity level when exercising independently        Knowledge and understanding of Target Heart Rate Range (THRR) Yes       Intervention Provide education and explanation of THRR including how the numbers were predicted and where they are located for reference       Expected Outcomes Short Term: Able to state/look up THRR;Long Term: Able to use THRR to govern intensity when exercising independently;Short Term: Able to use daily as guideline for intensity in rehab       Able to check pulse independently Yes       Intervention Provide education and demonstration on how to check pulse in carotid and radial arteries.;Review the importance of being able to check your own pulse  for safety during independent exercise       Expected Outcomes Short Term: Able to explain why pulse checking is important during independent exercise;Long Term: Able to check pulse independently and accurately       Understanding of Exercise Prescription Yes       Intervention Provide education, explanation, and written materials on patient's individual exercise prescription       Expected Outcomes Long Term: Able to explain home exercise prescription to exercise independently;Short Term: Able to explain program exercise prescription                Exercise Goals Re-Evaluation :  Exercise Goals Re-Evaluation     Row Name 08/19/22 0936 09/02/22 1340 09/17/22 0902 09/25/22 0946 09/30/22 1208     Exercise Goal Re-Evaluation   Exercise Goals Review Able to understand and use rate of perceived exertion (RPE) scale;Able to understand and use Dyspnea scale;Knowledge and understanding of Target Heart Rate Range (THRR);Understanding of Exercise Prescription Increase Physical Activity;Increase Strength and Stamina;Understanding of Exercise Prescription Increase Physical Activity;Increase Strength and Stamina;Understanding of Exercise Prescription Increase Physical Activity;Increase Strength and Stamina;Understanding of Exercise Prescription;Able to understand and use rate of  perceived exertion (RPE) scale;Knowledge and understanding of Target Heart Rate Range (THRR);Able to understand and use Dyspnea scale;Able to check pulse independently Increase Physical Activity;Increase Strength and Stamina;Understanding of Exercise Prescription   Comments Reviewed RPE scale, THR and program prescription with pt today.  Pt voiced understanding and was given a copy of goals to take home. Donald Hart has been doing well for the first couple of weeks he has been at rehab. He has significantly increased on all of his exercises, including the treadmill, XR, REL, and T4. He has been working an average of 4+ METS on each machine with appropriate RPEs. He has been reaching his THR each session. We will continue to monitor as he progresses in the program. Donald Hart continues to do well in the program. He has continued to work at an overall average MET level above 3.9 METs. He also improved to level 5 on the recumbent elliptical. He increased his treadmill speed to 2.7 mph while maintaining a 2.5% incline as well. We will continue to monitor his progress in the program. Reviewed home exercise with pt today.  Pt plans to walk at home for exercise. He also is looking into getting a gym membership. Reviewed THR, pulse, RPE, sign and symptoms, pulse oximetery and when to call 911 or MD.  Also discussed weather considerations and indoor options.  Pt voiced understanding. Donald Hart continues to do well in rehab. He has increased to a 3/3% workload on the treadmill. He also started intervals on the XR and is working up to level 14, working up to 8.5 METS! He also started using 5 lb for handweights.  He continues to reach his THR. We will continue to monitor.   Expected Outcomes Short: Use RPE daily to regulate intensity. Long: Follow program prescription in THR. Short: Continue to increase speed on treadmill gradually Long: Continue to increase overall MET level and strength Short: Continue to gradually increase treadmill  workload. Long: Continue to improve strength and stamina. Short: Add in two days of exercise at home. Long: Continue to improve strength and stamina. Short: Continue intervals on XR, try on treadmill Long: Continue to increase overall MET level and stamina    Row Name 10/14/22 1424 10/28/22 1459 11/04/22 0922         Exercise Goal Re-Evaluation   Exercise Goals Review  Increase Physical Activity;Increase Strength and Stamina;Understanding of Exercise Prescription Increase Physical Activity;Increase Strength and Stamina;Understanding of Exercise Prescription Increase Physical Activity;Increase Strength and Stamina;Understanding of Exercise Prescription     Comments Donald Hart is doing well in rehab. He recently improved his overall average MET level to 4.75 METs. He also increased his treadmill workload to a speed of 3.1 mph and an incline of 4%, although he has been inconsistent. He started using the elliptical as well and was able to tolerate 9 minutes at level 1, and 5 minutes at level 3. We will continue to monitor his progress in the program. Donald Hart continues to do well in rehab. He tried out the elliptical again and was able to complete 5 minutes. He also working over 5 METs on the treadmill as he increased his workload to 3.3/3.5%. He is due for his post and we hope to see improvement. We will continue to monitor. Donald Hart is doing well in rehab.  He has been walking and doing weights at home on his off days.  He usually will go for about 30 min around the block.  He has noticed that his stamina has gotten better since being in program.  He improved his walk test by 35%!!  He is planning to continue to walk and use weights after graduation.  He is also hoping to be able to return to work.     Expected Outcomes Short: Stay consistent with increased treadmill workload. Long: Continue to improve strength and stamina. Short: Improve on post Long: Continue to increase overall MET level and stamina Short:  Graduate Long: Continue to exercise indpendently              Discharge Exercise Prescription (Final Exercise Prescription Changes):  Exercise Prescription Changes - 10/28/22 1400       Response to Exercise   Blood Pressure (Admit) 108/56    Blood Pressure (Exit) 118/68    Heart Rate (Admit) 68 bpm    Heart Rate (Exercise) 97 bpm    Heart Rate (Exit) 86 bpm    Rating of Perceived Exertion (Exercise) 15    Symptoms none    Duration Continue with 30 min of aerobic exercise without signs/symptoms of physical distress.    Intensity THRR unchanged      Progression   Progression Continue to progress workloads to maintain intensity without signs/symptoms of physical distress.    Average METs 4.48      Resistance Training   Training Prescription Yes    Weight 5 lb    Reps 10-15      Interval Training   Interval Training No      Treadmill   MPH 3.3    Grade 3.5    Minutes 15    METs 5.12      Recumbant Elliptical   Level 3.5    Minutes 15    METs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 hand washing and proper equipment cleaning during exercise session. Flowsheet Row Cardiac Rehab from 11/06/2022 in [facility] Cardiac and Pulmonary  Rehab  Date 08/13/22  Educator NT  Instruction Review Code 1- Verbalizes Understanding       Falls Prevention: - Provides verbal and written material to individual with discussion of falls prevention and safety. Flowsheet Row Cardiac Rehab from 11/06/2022 in [facility] Cardiac and Pulmonary Rehab  Date 08/12/22  Educator SB  Instruction Review Code 1- Verbalizes Understanding       Other: -Provides group and verbal instruction on various topics (see comments)   Knowledge Questionnaire Score:  Knowledge Questionnaire Score - 08/13/22 1610  Knowledge Questionnaire Score   Pre Score 19/26             Core Components/Risk Factors/Patient Goals at Admission:  Personal Goals and Risk Factors at Admission - 08/13/22 0938       Core Components/Risk Factors/Patient Goals on Admission    Weight Management Yes    Intervention Weight Management: Develop a combined nutrition and exercise program designed to reach desired caloric intake, while maintaining appropriate intake of nutrient and fiber, sodium and fats, and appropriate energy expenditure required for the weight goal.;Weight Management: Provide education and appropriate resources to help participant work on and attain dietary goals.    Admit Weight 200 lb 1.6 oz (90.8 kg)    Goal Weight: Long Term 200 lb (90.7 kg)    Expected Outcomes Short Term: Continue to assess and modify interventions until short term weight is achieved;Long Term: Adherence to nutrition and physical activity/exercise program aimed toward attainment of established weight goal;Weight Maintenance: Understanding of the daily nutrition guidelines, which includes 25-35% calories from fat, 7% or less cal from saturated fats, less than 200mg  cholesterol, less than 1.5gm of sodium, & 5 or more servings of fruits and vegetables daily    Tobacco Cessation Yes    Number of packs per day 0   Quit Smoking, Patient is still vaping   Intervention Assist the participant in  steps to quit. Provide individualized education and counseling about committing to Tobacco Cessation, relapse prevention, and pharmacological support that can be provided by physician.;Education officer, environmental, assist with locating and accessing local/national Quit Smoking programs, and support quit date choice.    Expected Outcomes Short Term: Will demonstrate readiness to quit, by selecting a quit date.;Short Term: Will quit all tobacco product use, adhering to prevention of relapse plan.;Long Term: Complete abstinence from all tobacco products for at least 12 months from quit date.    Diabetes Yes    Intervention Provide education about signs/symptoms and action to take for hypo/hyperglycemia.;Provide education about proper nutrition, including hydration, and aerobic/resistive exercise prescription along with prescribed medications to achieve blood glucose in normal ranges: Fasting glucose 65-99 mg/dL    Expected Outcomes Short Term: Participant verbalizes understanding of the signs/symptoms and immediate care of hyper/hypoglycemia, proper foot care and importance of medication, aerobic/resistive exercise and nutrition plan for blood glucose control.;Long Term: Attainment of HbA1C < 7%.    Hypertension Yes    Intervention Provide education on lifestyle modifcations including regular physical activity/exercise, weight management, moderate sodium restriction and increased consumption of fresh fruit, vegetables, and low fat dairy, alcohol moderation, and smoking cessation.;Monitor prescription use compliance.    Expected Outcomes Short Term: Continued assessment and intervention until BP is < 140/78mm HG in hypertensive participants. < 130/42mm HG in hypertensive participants with diabetes, heart failure or chronic kidney disease.;Long Term: Maintenance of blood pressure at goal levels.    Lipids Yes    Intervention Provide education and support for participant on nutrition & aerobic/resistive exercise  along with prescribed medications to achieve LDL 70mg , HDL >40mg .    Expected Outcomes Short Term: Participant states understanding of desired cholesterol values and is compliant with medications prescribed. Participant is following exercise prescription and nutrition guidelines.;Long Term: Cholesterol controlled with medications as prescribed, with individualized exercise RX and with personalized nutrition plan. Value goals: LDL < 70mg , HDL > 40 mg.             Education:Diabetes - Individual verbal and written instruction to review signs/symptoms of diabetes, desired ranges of glucose  level fasting, after meals and with exercise. Acknowledge that pre and post exercise glucose checks will be done for 3 sessions at entry of program. Flowsheet Row Cardiac Rehab from 11/06/2022 in [facility] Cardiac and Pulmonary Rehab  Education need identified 08/13/22       Core Components/Risk Factors/Patient Goals Review:   Goals and Risk Factor Review     Row Name 09/06/22 0936 09/25/22 0957 11/04/22 0925         Core Components/Risk Factors/Patient Goals Review   Personal Goals Review Other Diabetes;Weight Management/Obesity;Hypertension;Other Diabetes;Weight Management/Obesity;Hypertension;Other     Review Donald Hart states that Flonnie Overman is making him short of breath and cannot take it., He is working with his doctor to find a different medication that works for him. Donald Hart states that he is happy with where his weight is at as of now. He plans to maintain his weight between 190-200 lbs. He also states issues with his appetite and sleep regarding his medication. He was encouraged to talk to his doctor about those issues. He has not been checking his BP at home but states he does have a BP cuff. He also has not been checking his blood sugars but was encouraged to do so. Donald Hart is nearing graduation.  His weight is staying steady.   He is still struggling with sleep.  His pressures are doing well in rehab.  His  sugars have been doing well too  He will continue to keep close eye on his risk factors     Expected Outcomes Short: talk to doctor about different medication oiptions. Long: take medications routinely. Short: begin to check BP and blood sugars at home. Long: Continue to monitor lifestyle risk factors. Continue to monitor risk factors              Core Components/Risk Factors/Patient Goals at Discharge (Final Review):   Goals and Risk Factor Review - 11/04/22 0925       Core Components/Risk Factors/Patient Goals Review   Personal Goals Review Diabetes;Weight Management/Obesity;Hypertension;Other    Review Donald Hart is nearing graduation.  His weight is staying steady.   He is still struggling with sleep.  His pressures are doing well in rehab.  His sugars have been doing well too  He will continue to keep close eye on his risk factors    Expected Outcomes Continue to monitor risk factors             ITP Comments:  ITP Comments     Row Name 08/12/22 1613 08/13/22 0933 08/19/22 0936 08/21/22 1426 09/18/22 0915   ITP Comments Virtual orientation call completed today. he has an appointment on Date: 0213/2024  for EP eval and gym Orientation.  Documentation of diagnosis can be found in [facility] Date: [id number] . Completed and gym orientation. Initial ITP created and sent for review to Dr. Bethann Punches, Medical Director. First full day of exercise!  Patient was oriented to gym and equipment including functions, settings, policies, and procedures.  Patient's individual exercise prescription and treatment plan were reviewed.  All starting workloads were established based on the results of the 6 minute walk test done at initial orientation visit.  The plan for exercise progression was also introduced and progression will be customized based on patient's performance and goals. 30 day review completed. ITP sent to Dr. Bethann Punches, Medical Director of Cardiac Rehab. Continue with ITP unless changes are  made by physician. New to rehab, first day 08/19/22. 30 Day review completed. Medical Director ITP review done,  changes made as directed, and signed approval by Medical Director.    Row Name 09/18/22 1017 10/16/22 1331 11/08/22 1009       ITP Comments Completed initial RD consultation 30 day review completed. ITP sent to Dr. Bethann Punches, Medical Director of Cardiac Rehab. Continue with ITP unless changes are made by physician. Ether graduated today from  rehab with 36 sessions completed.  Details of the patient's exercise prescription and what He needs to do in order to continue the prescription and progress were discussed with patient.  Patient was given a copy of prescription and goals.  Patient verbalized understanding.  Thompson plans to continue to exercise by exercising at home.              Comments: DISCHARGE ITP

## 2022-11-08 NOTE — Progress Notes (Signed)
Discharge Note   Donald Hart   DOB:02-May-1971  Donald Hart graduated today from  rehab with 36 sessions completed.  Details of the patient's exercise prescription and what Donald Hart needs to do in order to continue the prescription and progress were discussed with patient.  Patient was given a copy of prescription and goals.  Patient verbalized understanding.  Donald Hart plans to continue to exercise by exercising at home.   6 Minute Walk     Row Name 08/13/22 0947 10/30/22 0938       6 Minute Walk   Phase Initial Discharge    Distance 1165 feet 1575 feet    Distance % Change -- 35.2 %    Distance Feet Change -- 410 ft    Walk Time 6 minutes 6 minutes    # of Rest Breaks 0 0    MPH 2.21 2.98    METS 4.06 4.58    RPE 13 11    Perceived Dyspnea  3 0    VO2 Peak 14.2 16.02    Symptoms Yes (comment) No    Comments SOB --    Resting HR 84 bpm 66 bpm    Resting BP 124/68 120/70    Resting Oxygen Saturation  98 % 98 %    Exercise Oxygen Saturation  during 6 min walk 97 % 96 %    Max Ex. HR 105 bpm 93 bpm    Max Ex. BP 138/72 132/70    2 Minute Post BP 94/56 --            THank you for the referral.

## 2022-11-08 NOTE — Progress Notes (Signed)
Daily Session Note  Patient Details  Name: Donald Hart MRN: [id number] Date of Birth: 09/25/1970 Referring Provider:   Flowsheet Row Cardiac Rehab from 08/13/2022 in [facility] Cardiac and Pulmonary Rehab  Referring Provider Dr. Lorine Bears, MD       Encounter Date: 11/08/2022  Check In:  Session Check In - 11/08/22 1008       Check-In   Supervising physician immediately available to respond to emergencies See telemetry face sheet for immediately available ER MD    Location [facility]-Cardiac & Pulmonary Rehab    Staff Present Cora Collum, RN, BSN, CCRP;Jessica [place], MA, RCEP, CCRP, CCET;Joseph [place], [place]    Virtual Visit No    Medication changes reported     No    Fall or balance concerns reported    No    Warm-up and Cool-down Performed on first and last piece of equipment    Resistance Training Performed Yes    VAD Patient? No    PAD/SET Patient? No      Pain Assessment   Currently in Pain? No/denies                Social History   Tobacco Use  Smoking Status Former   Packs/day: 1.00   Years: 20.00   Additional pack years: 0.00   Total pack years: 20.00   Types: Cigarettes   Quit date: 07/23/2022   Years since quitting: 0.2  Smokeless Tobacco Never  Tobacco Comments   Currently -ano cigarettes since heart attack vapes regularly throughout the week.  Wants to quit    Goals Met:  Independence with exercise equipment Exercise tolerated well Personal goals reviewed No report of concerns or symptoms today  Goals Unmet:  Not Applicable  Comments:  Donald Hart graduated today from  rehab with 36 sessions completed.  Details of the patient's exercise prescription and what He needs to do in order to continue the prescription and progress were discussed with patient.  Patient was given a copy of prescription and goals.  Patient verbalized understanding.  Donald Hart plans to continue to exercise by exercising at home.    Dr. Bethann Punches is Medical  Director for [facility] Cardiac Rehabilitation.  Dr. Vida Rigger is Medical Director for [facility] Pulmonary Rehabilitation.

## 2022-11-20 ENCOUNTER — Emergency Department
Admission: EM | Admit: 2022-11-20 | Discharge: 2022-11-20 | Disposition: A | Discharge status: Home / Self Care | Payer: Medicaid Other | Attending: Emergency Medicine | Admitting: Emergency Medicine

## 2022-11-20 ENCOUNTER — Other Ambulatory Visit: Payer: Self-pay

## 2022-11-20 ENCOUNTER — Encounter: Payer: Self-pay | Admitting: Emergency Medicine

## 2022-11-20 DIAGNOSIS — R45851 Suicidal ideations: Secondary | ICD-10-CM

## 2022-11-20 DIAGNOSIS — F4320 Adjustment disorder, unspecified: Secondary | ICD-10-CM | POA: Insufficient documentation

## 2022-11-20 DIAGNOSIS — T426X2A Poisoning by other antiepileptic and sedative-hypnotic drugs, intentional self-harm, initial encounter: Secondary | ICD-10-CM | POA: Insufficient documentation

## 2022-11-20 LAB — CBC
HCT: 51.3 % (ref 39.0–52.0)
Hemoglobin: 17.6 g/dL — ABNORMAL HIGH (ref 13.0–17.0)
MCH: 29.6 pg (ref 26.0–34.0)
MCHC: 34.3 g/dL (ref 30.0–36.0)
MCV: 86.2 fL (ref 80.0–100.0)
Platelets: 366 10*3/uL (ref 150–400)
RBC: 5.95 MIL/uL — ABNORMAL HIGH (ref 4.22–5.81)
RDW: 12.1 % (ref 11.5–15.5)
WBC: 11.2 10*3/uL — ABNORMAL HIGH (ref 4.0–10.5)
nRBC: 0 % (ref 0.0–0.2)

## 2022-11-20 LAB — COMPREHENSIVE METABOLIC PANEL
ALT: 26 U/L (ref 0–44)
AST: 25 U/L (ref 15–41)
Albumin: 5.2 g/dL — ABNORMAL HIGH (ref 3.5–5.0)
Alkaline Phosphatase: 52 U/L (ref 38–126)
Anion gap: 13 (ref 5–15)
BUN: 20 mg/dL (ref 6–20)
CO2: 19 mmol/L — ABNORMAL LOW (ref 22–32)
Calcium: 9.7 mg/dL (ref 8.9–10.3)
Chloride: 105 mmol/L (ref 98–111)
Creatinine, Ser: 1.06 mg/dL (ref 0.61–1.24)
GFR, Estimated: >60 mL/min (ref >60)
Glucose, Bld: 194 mg/dL — ABNORMAL HIGH (ref 70–99)
Potassium: 3.8 mmol/L (ref 3.5–5.1)
Sodium: 137 mmol/L (ref 135–145)
Total Bilirubin: 1.6 mg/dL — ABNORMAL HIGH (ref 0.3–1.2)
Total Protein: 8.7 g/dL — ABNORMAL HIGH (ref 6.5–8.1)

## 2022-11-20 LAB — URINE DRUG SCREEN, QUALITATIVE (ARMC ONLY)
Amphetamines, Ur Screen: NOT DETECTED
Barbiturates, Ur Screen: NOT DETECTED
Benzodiazepine, Ur Scrn: NOT DETECTED
Cannabinoid 50 Ng, Ur ~~LOC~~: POSITIVE — AB
Cocaine Metabolite,Ur ~~LOC~~: NOT DETECTED
MDMA (Ecstasy)Ur Screen: POSITIVE — AB
Methadone Scn, Ur: NOT DETECTED
Opiate, Ur Screen: NOT DETECTED
Phencyclidine (PCP) Ur S: NOT DETECTED
Tricyclic, Ur Screen: NOT DETECTED

## 2022-11-20 LAB — ETHANOL: Alcohol, Ethyl (B): <10 mg/dL (ref <10)

## 2022-11-20 LAB — SALICYLATE LEVEL: Salicylate Lvl: <7 mg/dL — ABNORMAL LOW (ref 7.0–30.0)

## 2022-11-20 LAB — ACETAMINOPHEN LEVEL: Acetaminophen (Tylenol), Serum: <10 ug/mL — ABNORMAL LOW (ref 10–30)

## 2022-11-20 MED ORDER — EMPAGLIFLOZIN 25 MG PO TABS: 25.0000 mg | ORAL_TABLET | Freq: Every day | ORAL | Status: DC

## 2022-11-20 MED ORDER — ONDANSETRON HCL 4 MG PO TABS: 4.0000 mg | ORAL_TABLET | Freq: Three times a day (TID) | ORAL | Status: DC | PRN

## 2022-11-20 MED ORDER — CARVEDILOL 25 MG PO TABS
25.0000 mg | ORAL_TABLET | Freq: Two times a day (BID) | ORAL | Status: DC
  Filled 2022-11-20: qty 1

## 2022-11-20 MED ORDER — PANTOPRAZOLE SODIUM 40 MG PO TBEC: 40.0000 mg | DELAYED_RELEASE_TABLET | Freq: Every day | ORAL | Status: DC

## 2022-11-20 MED ORDER — CLOPIDOGREL BISULFATE 75 MG PO TABS: 75.0000 mg | ORAL_TABLET | Freq: Every day | ORAL | Status: DC

## 2022-11-20 MED ORDER — IBUPROFEN 600 MG PO TABS: 600.0000 mg | ORAL_TABLET | Freq: Three times a day (TID) | ORAL | Status: DC | PRN

## 2022-11-20 MED ORDER — ALUM & MAG HYDROXIDE-SIMETH 200-200-20 MG/5ML PO SUSP: 30.0000 mL | Freq: Four times a day (QID) | ORAL | Status: DC | PRN

## 2022-11-20 MED ORDER — ATORVASTATIN CALCIUM 20 MG PO TABS: 80.0000 mg | ORAL_TABLET | Freq: Every day | ORAL | Status: DC

## 2022-11-20 MED ORDER — SPIRONOLACTONE 25 MG PO TABS: 25.0000 mg | ORAL_TABLET | Freq: Every morning | ORAL | Status: DC

## 2022-11-20 MED ORDER — ASPIRIN 81 MG PO TBEC: 81.0000 mg | DELAYED_RELEASE_TABLET | Freq: Every day | ORAL | Status: DC

## 2022-11-20 MED ORDER — AMLODIPINE BESYLATE 5 MG PO TABS: 10.0000 mg | ORAL_TABLET | Freq: Every day | ORAL | Status: DC

## 2022-11-20 NOTE — ED Notes (Signed)
Patient Belongings: -gray tshirt -Lonya Johannesen tennis shoes -white socks -denim shorts -wallet -vape -brown belt -blue underwear

## 2022-11-20 NOTE — ED Triage Notes (Signed)
Patient to ED via PD IVC'd for psych evaluation. Patient took an unknown amount of medication to try and kill himself per family. Patient denying SI at this time. One of the medication was gabapentin- unsure of how many. Patient stating "I pray God will kill me but I won't."
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
## 2022-11-20 NOTE — ED Provider Notes (Addendum)
[facility] Provider Note    Event Date/Time   First MD Initiated Contact with Patient 11/20/22 1546     (approximate)   History   Chief Complaint: Psychiatric Evaluation   HPI  Donald Hart is a 52 y.o. male with a history of hypertension, diabetes, polysubstance abuse who was brought to the ED under involuntary commitment.  Patient reportedly took gabapentin overdose in a suicide attempt.  Patient states that he cannot remember how much he took or when he took it.  He tells me that he is not willing to talk to anybody at this hospital and wants his stuff so that he can be discharged.  He is uncooperative with interview and refuses to provide any further details.  He will not say if he took any other medications or used any drugs today.     Physical Exam   Triage Vital Signs: ED Triage Vitals  Enc Vitals Group     BP 11/20/22 1206 (!) 158/93     Pulse Rate 11/20/22 1206 (!) 105     Resp 11/20/22 1206 18     Temp 11/20/22 1206 98.8 F (37.1 C)     Temp Source 11/20/22 1206 Oral     SpO2 11/20/22 1206 96 %     Weight --      Height --      Head Circumference --      Peak Flow --      Pain Score 11/20/22 1207 0     Pain Loc --      Pain Edu? --      Excl. in GC? --     Most recent vital signs: Vitals:   11/20/22 1206  BP: (!) 158/93  Pulse: (!) 105  Resp: 18  Temp: 98.8 F (37.1 C)  SpO2: 96%    General: Awake, no distress.  Oriented x 3.  Clear speech, normal language CV:  Good peripheral perfusion.  Resp:  Normal effort.  Abd:  No distention.  Other:  No wounds   ED Results / Procedures / Treatments   Labs (all labs ordered are listed, but only abnormal results are displayed) Labs Reviewed  COMPREHENSIVE METABOLIC PANEL - Abnormal; Notable for the following components:      Result Value   CO2 19 (*)    Glucose, Bld 194 (*)    Total Protein 8.7 (*)    Albumin 5.2 (*)    Total Bilirubin 1.6 (*)    All other  components within normal limits  SALICYLATE LEVEL - Abnormal; Notable for the following components:   Salicylate Lvl <7.0 (*)    All other components within normal limits  ACETAMINOPHEN LEVEL - Abnormal; Notable for the following components:   Acetaminophen (Tylenol), Serum <10 (*)    All other components within normal limits  CBC - Abnormal; Notable for the following components:   WBC 11.2 (*)    RBC 5.95 (*)    Hemoglobin 17.6 (*)    All other components within normal limits  URINE DRUG SCREEN, QUALITATIVE ([facility] ONLY) - Abnormal; Notable for the following components:   MDMA (Ecstasy)Ur Screen POSITIVE (*)    Cannabinoid 50 Ng, Ur [place] POSITIVE (*)    All other components within normal limits  ETHANOL     EKG    RADIOLOGY    PROCEDURES:  Procedures   MEDICATIONS ORDERED IN ED: Medications  ibuprofen (ADVIL) tablet 600 mg (has no administration in time range)  ondansetron (  ZOFRAN) tablet 4 mg (has no administration in time range)  alum & mag hydroxide-simeth (MAALOX/MYLANTA) 200-200-20 MG/5ML suspension 30 mL (has no administration in time range)  amLODipine (NORVASC) tablet 10 mg (has no administration in time range)  aspirin EC tablet 81 mg (has no administration in time range)  atorvastatin (LIPITOR) tablet 80 mg (has no administration in time range)  carvedilol (COREG) tablet 25 mg (has no administration in time range)  clopidogrel (PLAVIX) tablet 75 mg (has no administration in time range)  empagliflozin (JARDIANCE) tablet 25 mg (has no administration in time range)  pantoprazole (PROTONIX) EC tablet 40 mg (has no administration in time range)  spironolactone (ALDACTONE) tablet 25 mg (has no administration in time range)     IMPRESSION / MDM / ASSESSMENT AND PLAN / ED COURSE  I reviewed the triage vital signs and the nursing notes.  Patient's presentation is most consistent with acute presentation with potential threat to life or bodily function.  Patient  brought to the ED under involuntary commitment due to suspected gabapentin overdose.  Currently he is awake alert, nontoxic, no signs of intoxication.  Vital signs unremarkable.  Medically stable.  Will request psychiatry evaluation.  The patient has been placed in psychiatric observation due to the need to provide a safe environment for the patient while obtaining psychiatric consultation and evaluation, as well as ongoing medical and medication management to treat the patient's condition.  The patient has been placed under full IVC at this time.   ----------------------------------------- 6:52 PM on 11/20/2022 ----------------------------------------- Seen by psychiatry, cleared for discharge after providing a thorough history and collateral obtained from his mother.  Note reviewed, provides reassuring assessment.  IVC rescinded.  Stable for discharge     FINAL CLINICAL IMPRESSION(S) / ED DIAGNOSES   Final diagnoses:  Adjustment disorder, unspecified type     Rx / DC Orders   ED Discharge Orders     None        Note:  This document was prepared using Dragon voice recognition software and may include unintentional dictation errors.   Sharman Cheek, MD 11/20/22 1613    Sharman Cheek, MD 11/20/22 [phone]

## 2022-11-20 NOTE — ED Notes (Signed)
Np is talking to Patient at this time, and He is remaining calm, security is with her, no behavioral issues noted.

## 2022-12-19 ENCOUNTER — Encounter: Payer: Self-pay | Admitting: Nurse Practitioner

## 2022-12-19 ENCOUNTER — Ambulatory Visit: Payer: Medicaid Other | Attending: Nurse Practitioner | Admitting: Nurse Practitioner

## 2022-12-19 VITALS — BP 118/78 | HR 80 | Ht 74.0 in | Wt 205.2 lb

## 2022-12-19 DIAGNOSIS — E781 Pure hyperglyceridemia: Secondary | ICD-10-CM

## 2022-12-19 DIAGNOSIS — E785 Hyperlipidemia, unspecified: Secondary | ICD-10-CM

## 2022-12-19 DIAGNOSIS — I2510 Atherosclerotic heart disease of native coronary artery without angina pectoris: Secondary | ICD-10-CM | POA: Diagnosis not present

## 2022-12-19 DIAGNOSIS — E1169 Type 2 diabetes mellitus with other specified complication: Secondary | ICD-10-CM | POA: Diagnosis not present

## 2022-12-19 DIAGNOSIS — I10 Essential (primary) hypertension: Secondary | ICD-10-CM

## 2022-12-19 DIAGNOSIS — Z7984 Long term (current) use of oral hypoglycemic drugs: Secondary | ICD-10-CM

## 2022-12-19 NOTE — Patient Instructions (Signed)
Medication Instructions:  Your physician recommends that you continue on your current medications as directed. Please refer to the Current Medication list given to you today.  *If you need a refill on your cardiac medications before your next appointment, please call your pharmacy*   Lab Work: none If you have labs (blood work) drawn today and your tests are completely normal, you will receive your results only by: MyChart Message (if you have MyChart) OR A paper copy in the mail If you have any lab test that is abnormal or we need to change your treatment, we will call you to review the results.   Testing/Procedures: none  Follow-Up: At [facility], you and your health needs are our priority.  As part of our continuing mission to provide you with exceptional heart care, we have created designated Provider Care Teams.  These Care Teams include your primary Cardiologist (physician) and Advanced Practice Providers (APPs -  Physician Assistants and Nurse Practitioners) who all work together to provide you with the care you need, when you need it.  We recommend signing up for the patient portal called "MyChart".  Sign up information is provided on this After Visit Summary.  MyChart is used to connect with patients for Virtual Visits (Telemedicine).  Patients are able to view lab/test results, encounter notes, upcoming appointments, etc.  Non-urgent messages can be sent to your provider as well.   To learn more about what you can do with MyChart, go to [email or web address].    Your next appointment:   6 month(s)  Provider:   Lorine Bears, MD

## 2022-12-19 NOTE — Progress Notes (Signed)
Office Visit    Patient Name: Donald Hart Date of Encounter: 12/19/2022  Primary Care Provider:  Jodi Marble, NP Primary Cardiologist:  Donald Bears, MD  Chief Complaint    52 y.o. y/o male with history of CAD s/p NSTEMI/PCI of the LCX in 07/2022, pancreatitis, chronic hypertriglyceridemia, remote alcohol abuse, tobacco abuse, diabetes, hypertension, hyperlipidemia, gout, chronic back pain, GERD, and family history of premature CAD, who presents for CAD follow-up.  Past Medical History    Past Medical History:  Diagnosis Date   Acute pancreatitis 07/02/2015   hospitalization at [facility]   CAD (coronary artery disease)    a. 07/2022 NSTEMI/PCI: LM nl, LAD 68m, 50d, RI nl, LCX 71m (2.75x22 Onyx Frontier DES), RCA 20p/m, RPDA 30. EF 55-65%.   Chronic back pain    Diastolic dysfunction    a. 01/2016 Echo: EF 60-65%, GrI DD. Mild LVH, nl RV fxn, mildly dil LA; b. 07/2022 Echo: EF 60-65%, no rwma, nl RV fxn.   Family history of diabetes mellitus    sister   Family history of heart attack    Father.    Family history of stomach cancer    Gout    Hypertension    Hypertriglyceridemia    Remote Alcohol abuse    Tobacco abuse    Type 2 diabetes mellitus (HCC) 07/02/2015   Past Surgical History:  Procedure Laterality Date   CORONARY STENT INTERVENTION N/A 07/23/2022   Procedure: CORONARY STENT INTERVENTION;  Surgeon: Donald Ouch, MD;  Location: [facility] INVASIVE CV LAB;  Service: Cardiovascular;  Laterality: N/A;   LEFT HEART CATH AND CORONARY ANGIOGRAPHY N/A 07/23/2022   Procedure: LEFT HEART CATH AND CORONARY ANGIOGRAPHY;  Surgeon: Donald Ouch, MD;  Location: [facility] INVASIVE CV LAB;  Service: Cardiovascular;  Laterality: N/A;   NO PAST SURGERIES      Allergies  Allergies  Allergen Reactions   Losartan     cramping    History of Present Illness      52 y.o. y/o male with the above past medical history including CAD, pancreatitis, chronic  hypertriglyceridemia, remote alcohol abuse, tobacco abuse, diabetes, hypertension, hyperlipidemia, gout, chronic back pain, GERD, and family history of premature CAD. In 2017, he required hospitalization for pancreatitis and hypertriglyceridemia (6751) requiring plasmapheresis and insulin drip, complicated by distributive shock and respiratory failure requiring intubation, further complicated by MRSA pneumonia, bacteremia, AKI, and deconditioning. Echo at that time showed normal LV function. He was readmitted with pancreatitis in 2018 was evaluated by our team in the setting of elevated triglycerides with recommendation for outpatient cardiology follow-up. On July 22, 2022, he was admitted to [facility] with substernal chest tightness, which on further questioning, he reported to be progressive over the preceding year. He was initially hypotensive and ECG was unremarkable. He ruled in for non-STEMI. Diagnostic catheterization revealed severe left circumflex disease, which was successfully treated with a drug-eluting stent. Echo during hospitalization showed normal LV function.  Following discharge, he required discontinuation of Brilinta and initiation of clopidogrel therapy in the setting of dyspnea associated with Brilinta.  Dyspnea subsequently resolved.   [name] was last seen in cardiology clinic in March of this year, at which time he was participating in cardiac rehabilitation and doing well.  Unfortunately, he required ED evaluation in May due to possible suicide attempt (gabapentin overdose).  Tox screen + for MDMA and cannabinoid.  He was seen by psych and discharged home after it was determined that he only took 2  gabapentin and was not trying to harm himself.  From a cardiac standpoint, he has mostly done well.  He recently ran out of gabapentin and last week he had some sharp pain in his chest, back, and down his legs.  Pain could last all day.  Once he refilled his gabapentin, symptoms  resolved.  He has not had any symptoms similar to prior angina and denies dyspnea, palpitations, PND, orthopnea, dizziness, syncope, edema, or early satiety.  He has been compliant with his cardiac medications.  He continues to vape daily legal level of THC available at most vape stores.  He says it helps to calm him down better than psych meds.  Home Medications    Current Meds  Medication Sig   albuterol (PROVENTIL HFA;VENTOLIN HFA) 108 (90 Base) MCG/ACT inhaler Inhale 1-2 puffs every 6 (six) hours as needed into the lungs for wheezing or shortness of breath.   amLODipine (NORVASC) 10 MG tablet Take 10 mg by mouth daily.   aspirin EC 81 MG tablet Take 1 tablet (81 mg total) by mouth daily. Swallow whole.   atorvastatin (LIPITOR) 80 MG tablet Take 80 mg by mouth daily.   carvedilol (COREG) 25 MG tablet Take 1 tablet (25 mg total) by mouth 2 (two) times daily.   clopidogrel (PLAVIX) 75 MG tablet Take 1 tablet (75 mg total) by mouth daily.   fenofibrate (TRICOR) 145 MG tablet Take 145 mg by mouth daily.   gabapentin (NEURONTIN) 800 MG tablet Take 800 mg by mouth 3 (three) times daily.   hydrochlorothiazide (HYDRODIURIL) 50 MG tablet Take 50 mg by mouth daily.   JARDIANCE 25 MG TABS tablet Take 25 mg by mouth daily.   losartan (COZAAR) 100 MG tablet Take 1 tablet (100 mg total) by mouth daily.   nicotine (NICODERM CQ - DOSED IN MG/24 HOURS) 21 mg/24hr patch Place 1 patch (21 mg total) onto the skin daily.   omeprazole (PRILOSEC) 20 MG capsule Take 20 mg by mouth daily as needed.    Review of Systems    Was having sharp pain that resolved w/ gabapentin.  He denies angina, palpitations, dyspnea, pnd, orthopnea, n, v, dizziness, syncope, edema, weight gain, or early satiety.  All other systems reviewed and are otherwise negative except as noted above.    Physical Exam    VS:  BP 118/78 (BP Location: Left Arm, Patient Position: Sitting, Cuff Size: Normal)   Pulse 80   Ht 6\' 2"  (1.88 m)   Wt  205 lb 4 oz (93.1 kg)   SpO2 98%   BMI 26.35 kg/m  , BMI Body mass index is 26.35 kg/m.     GEN: Well nourished, well developed, in no acute distress. HEENT: normal. Neck: Supple, no JVD, carotid bruits, or masses. Cardiac: RRR, no murmurs, rubs, or gallops. No clubbing, cyanosis, edema.  Radials 2+/PT 2+ and equal bilaterally.  Respiratory:  Respirations regular and unlabored, clear to auscultation bilaterally. GI: Soft, nontender, nondistended, BS + x 4. MS: no deformity or atrophy. Skin: warm and dry, no rash. Neuro:  Strength and sensation are intact. Psych: Normal affect.  Accessory Clinical Findings    ECG personally reviewed by me today -    RSR, 80 - no acute changes.  Lab Results  Component Value Date   WBC 11.2 (H) 11/20/2022   HGB 17.6 (H) 11/20/2022   HCT 51.3 11/20/2022   MCV 86.2 11/20/2022   PLT 366 11/20/2022   Lab Results  Component Value Date  CREATININE 1.06 11/20/2022   BUN 20 11/20/2022   NA 137 11/20/2022   K 3.8 11/20/2022   CL 105 11/20/2022   CO2 19 (L) 11/20/2022   Lab Results  Component Value Date   ALT 26 11/20/2022   AST 25 11/20/2022   ALKPHOS 52 11/20/2022   BILITOT 1.6 (H) 11/20/2022   Lab Results  Component Value Date   CHOL 104 07/23/2022   HDL 27 (L) 07/23/2022   LDLCALC 33 07/23/2022   TRIG 221 (H) 07/23/2022   CHOLHDL 3.9 07/23/2022    Lab Results  Component Value Date   HGBA1C 6.6 (H) 07/23/2022    Assessment & Plan    1.  CAD:  s/p NSTEMI in 07/2022 w/ PCI/DES to the LCX.  He has completed cardiac rehab and generally has done well.  Recent sharp chest pain assoc w/ back and leg pain after running out of gabapentin.  This resolved following resumption of gabapentin.  Denies symptoms similar to prior angina or dyspnea.  Compliant w/ meds and tolerating well.  Cont asa, plavix, ? blocker, statin, and fibrate.  2.  Primary HTN:  BP stable on ? blocker, ccb, and hydrochlorothiazide.  His list still has losartan on it,  though he isn't sure if he is taking as he prev told us that he was intolerant.  3.  HL/HTG:  LDL 33 w/ TG of 221 in January.  LFTs nl in May.  Cont statin and fibrate.  4.  DMII:  A1c 6.6 in January.  On empagliflozin and followed by PCP.  5.  Tobacco Use/Vape usage:  Has remained off of cigarettes.  Still vaping a low-level THC product that is sold legally - helps to calm his nerves.  6.  Neuropathic Pain:  improved since resuming gabapentin.  7.  Disposition:  f/u in 6 mos or sooner if necessary.  Nicolasa Ducking, NP 12/19/2022, 4:27 PM

## 2023-03-23 DIAGNOSIS — R7982 Elevated C-reactive protein (CRP): Secondary | ICD-10-CM | POA: Insufficient documentation

## 2023-03-23 DIAGNOSIS — G894 Chronic pain syndrome: Secondary | ICD-10-CM | POA: Insufficient documentation

## 2023-03-23 DIAGNOSIS — F1020 Alcohol dependence, uncomplicated: Secondary | ICD-10-CM | POA: Insufficient documentation

## 2023-03-23 DIAGNOSIS — M1990 Unspecified osteoarthritis, unspecified site: Secondary | ICD-10-CM | POA: Insufficient documentation

## 2023-03-23 DIAGNOSIS — M899 Disorder of bone, unspecified: Secondary | ICD-10-CM | POA: Insufficient documentation

## 2023-03-23 DIAGNOSIS — Z789 Other specified health status: Secondary | ICD-10-CM | POA: Insufficient documentation

## 2023-03-23 DIAGNOSIS — Z79899 Other long term (current) drug therapy: Secondary | ICD-10-CM | POA: Insufficient documentation

## 2023-03-23 NOTE — Patient Instructions (Signed)

## 2023-03-23 NOTE — Progress Notes (Unsigned)
Patient: Donald Hart  Service Category: E/M  Provider: Oswaldo Done, MD  DOB: 07/21/1970  DOS: 03/24/2023  Referring Provider: Jodi Marble, NP  MRN: [id number]  Setting: Ambulatory outpatient  PCP: Jodi Marble, NP  Type: New Patient  Specialty: Interventional Pain Management    Location: Office  Delivery: Face-to-face     Primary Reason(s) for Visit: Encounter for initial evaluation of one or more chronic problems (new to examiner) potentially causing chronic pain, and posing a threat to normal musculoskeletal function. (Level of risk: High) CC: No chief complaint on file.  HPI  Donald Hart is a 52 y.o. year old, male patient, who comes for the first time to our practice referred by Jodi Marble, NP for our initial evaluation of his chronic pain. He has Pancreatitis, acute; Insulin dependent diabetes mellitus; Alcohol abuse; ARF (acute renal failure) (HCC); Leukocytosis; Acute on chronic pancreatitis (HCC); Hypertriglyceridemia; Atypical chest pain; Liver lesion; Polysubstance abuse (HCC); Left-sided chest pain; Uncontrolled type 2 diabetes mellitus with hyperglycemia, with long-term current use of insulin (HCC); Elevated lipase; Hypotension; Family history of premature CAD; History of substance use disorder; Alcohol use disorder; Tobacco use disorder; Hypokalemia; Non-ST elevation (NSTEMI) myocardial infarction [facility]); Essential hypertension; Suicidal ideation; Chronic pain syndrome; Pharmacologic therapy; Disorder of skeletal system; Problems influencing health status; Type 2 diabetes mellitus treated with insulin (HCC); Abnormal liver enzymes; Anesthesia of skin; Arthritis; Chronic fatigue; Depression; Diabetic polyneuropathy associated with type 2 diabetes mellitus (HCC); Difficulty sleeping; GERD (gastroesophageal reflux disease); Gout; MRSA (methicillin resistant staphylococcus aureus) pneumonia (HCC); MRSA bacteremia; Neck pain; Chronic low back pain; Tobacco abuse; AKI  (acute kidney injury) (HCC); Glucose intolerance (impaired glucose tolerance); Chronic pancreatitis due to chronic alcoholism (HCC); and Elevated C-reactive protein (CRP) on their problem list. Today he comes in for evaluation of his No chief complaint on file.  Pain Assessment: Location:     Radiating:   Onset:   Duration:   Quality:   Severity:  /10 (subjective, self-reported pain score)  Effect on ADL:   Timing:   Modifying factors:   BP:    HR:    Onset and Duration: {Hx; Onset and Duration:210120511} Cause of pain: {Hx; Cause:210120521} Severity: {Pain Severity:210120502} Timing: {Symptoms; Timing:210120501} Aggravating Factors: {Causes; Aggravating pain factors:210120507} Alleviating Factors: {Causes; Alleviating Factors:210120500} Associated Problems: {Hx; Associated problems:210120515} Quality of Pain: {Hx; Symptom quality or Descriptor:210120531} Previous Examinations or Tests: {Hx; Previous examinations or test:210120529} Previous Treatments: {Hx; Previous Treatment:210120503}  Donald Hart is being evaluated for possible interventional pain management therapies for the treatment of his chronic pain.   ***  Donald Hart has been informed that this initial visit was an evaluation only.  On the follow up appointment I will go over the results, including ordered tests and available interventional therapies. At that time he will have the opportunity to decide whether to proceed with offered therapies or not. In the event that Donald Hart prefers avoiding interventional options, this will conclude our involvement in the case.  Medication management recommendations may be provided upon request.  Patient informed that diagnostic tests may be ordered to assist in identifying underlying causes, narrow the list of differential diagnoses and aid in determining candidacy for (or contraindications to) planned therapeutic interventions.  Historic Controlled Substance Pharmacotherapy Review   PMP and historical list of controlled substances: ***  Most recently prescribed opioid analgesics:   *** MME/day: *** mg/day  Historical Monitoring: The patient  reports no history of drug use. List of prior UDS Testing: Lab Results  Component  Value Date   MDMA POSITIVE (A) 11/20/2022   MDMA NONE DETECTED 07/23/2022   MDMA NONE DETECTED 11/18/2015   MDMA NONE DETECTED 01/19/2015   MDMA NEGATIVE 09/22/2013   COCAINSCRNUR NONE DETECTED 11/20/2022   COCAINSCRNUR NONE DETECTED 07/23/2022   COCAINSCRNUR NONE DETECTED 11/18/2015   COCAINSCRNUR NONE DETECTED 01/19/2015   COCAINSCRNUR POSITIVE 09/22/2013   PCPSCRNUR NONE DETECTED 11/20/2022   PCPSCRNUR NONE DETECTED 07/23/2022   PCPSCRNUR NONE DETECTED 11/18/2015   PCPSCRNUR NONE DETECTED 01/19/2015   PCPSCRNUR NEGATIVE 09/22/2013   THCU POSITIVE (A) 11/20/2022   THCU POSITIVE (A) 07/23/2022   THCU NONE DETECTED 11/18/2015   THCU NONE DETECTED 01/19/2015   THCU POSITIVE 09/22/2013   ETH <10 11/20/2022   ETH <10 07/23/2022   ETH 145 (H) 01/30/2018   ETH <5 11/18/2015   Historical Background Evaluation: [place] PMP: PDMP reviewed during this encounter. Review of the past 19-months conducted.             PMP NARX Score Report:  Narcotic: 020 Sedative: 010 Stimulant: 000 [place] Department of public safety, offender search: Engineer, mining Information) Non-contributory Risk Assessment Profile: Aberrant behavior: None observed or detected today Risk factors for fatal opioid overdose: None identified today PMP NARX Overdose Risk Score: 330 Fatal overdose hazard ratio (HR): Calculation deferred Non-fatal overdose hazard ratio (HR): Calculation deferred Risk of opioid abuse or dependence: 0.7-3.0% with doses <= 36 MME/day and 6.1-26% with doses >= 120 MME/day. Substance use disorder (SUD) risk level: See below Personal History of Substance Abuse (SUD-Substance use disorder):  Alcohol:    Illegal Drugs:    Rx Drugs:    ORT Risk Level calculation:     ORT Scoring interpretation table:  Score <3 = Low Risk for SUD  Score between 4-7 = Moderate Risk for SUD  Score >8 = High Risk for Opioid Abuse   PHQ-2 Depression Scale:  Total score:    PHQ-2 Scoring interpretation table: (Score and probability of major depressive disorder)  Score 0 = No depression  Score 1 = 15.4% Probability  Score 2 = 21.1% Probability  Score 3 = 38.4% Probability  Score 4 = 45.5% Probability  Score 5 = 56.4% Probability  Score 6 = 78.6% Probability   PHQ-9 Depression Scale:  Total score:    PHQ-9 Scoring interpretation table:  Score 0-4 = No depression  Score 5-9 = Mild depression  Score 10-14 = Moderate depression  Score 15-19 = Moderately severe depression  Score 20-27 = Severe depression (2.4 times higher risk of SUD and 2.89 times higher risk of overuse)   Pharmacologic Plan: As per protocol, I have not taken over any controlled substance management, pending the results of ordered tests and/or consults.            Initial impression: Pending review of available data and ordered tests.  Meds   Current Outpatient Medications:    albuterol (PROVENTIL HFA;VENTOLIN HFA) 108 (90 Base) MCG/ACT inhaler, Inhale 1-2 puffs every 6 (six) hours as needed into the lungs for wheezing or shortness of breath., Disp: 1 Inhaler, Rfl: 0   amLODipine (NORVASC) 10 MG tablet, Take 10 mg by mouth daily., Disp: , Rfl:    aspirin EC 81 MG tablet, Take 1 tablet (81 mg total) by mouth daily. Swallow whole., Disp: 30 tablet, Rfl: 12   atorvastatin (LIPITOR) 80 MG tablet, Take 80 mg by mouth daily., Disp: , Rfl:    carvedilol (COREG) 25 MG tablet, Take 1 tablet (25 mg total) by mouth 2 (  two) times daily., Disp: 180 tablet, Rfl: 3   clopidogrel (PLAVIX) 75 MG tablet, Take 1 tablet (75 mg total) by mouth daily., Disp: 90 tablet, Rfl: 3   fenofibrate (TRICOR) 145 MG tablet, Take 145 mg by mouth daily., Disp: , Rfl:    fenofibrate 160 MG tablet, Take 1 tablet (160 mg total) by  mouth daily. (Patient not taking: Reported on 11/20/2022), Disp: 30 tablet, Rfl: 0   folic acid (FOLVITE) 1 MG tablet, Take 1 mg by mouth daily. (Patient not taking: Reported on 12/19/2022), Disp: , Rfl:    gabapentin (NEURONTIN) 800 MG tablet, Take 800 mg by mouth 3 (three) times daily., Disp: , Rfl:    hydrochlorothiazide (HYDRODIURIL) 50 MG tablet, Take 50 mg by mouth daily., Disp: , Rfl:    hydrOXYzine (ATARAX) 10 MG tablet, Take 10 mg by mouth 3 (three) times daily. (Patient not taking: Reported on 12/19/2022), Disp: , Rfl:    hyoscyamine (LEVSIN, ANASPAZ) 0.125 MG tablet, Take 1 tablet (0.125 mg total) by mouth every 6 (six) hours as needed for cramping. (Patient not taking: Reported on 09/18/2022), Disp: 30 tablet, Rfl: 0   JARDIANCE 25 MG TABS tablet, Take 25 mg by mouth daily., Disp: , Rfl:    losartan (COZAAR) 100 MG tablet, Take 1 tablet (100 mg total) by mouth daily., Disp: 30 tablet, Rfl: 0   magnesium oxide (MAG-OX) 400 MG tablet, Take 400 mg by mouth daily. (Patient not taking: Reported on 09/18/2022), Disp: , Rfl:    Multiple Vitamin (MULTIVITAMIN WITH MINERALS) TABS tablet, Take 1 tablet by mouth daily. (Patient not taking: Reported on 09/18/2022), Disp: , Rfl:    nicotine (NICODERM CQ - DOSED IN MG/24 HOURS) 21 mg/24hr patch, Place 1 patch (21 mg total) onto the skin daily., Disp: 28 patch, Rfl: 0   omega-3 acid ethyl esters (LOVAZA) 1 g capsule, Take 2 g by mouth 2 (two) times daily. (Patient not taking: Reported on 09/18/2022), Disp: , Rfl:    omeprazole (PRILOSEC) 20 MG capsule, Take 20 mg by mouth daily as needed., Disp: , Rfl:    spironolactone (ALDACTONE) 25 MG tablet, Take 25 mg by mouth every morning. (Patient not taking: Reported on 12/19/2022), Disp: , Rfl:    thiamine 100 MG tablet, Take 1 tablet (100 mg total) by mouth daily. (Patient not taking: Reported on 09/18/2022), Disp: , Rfl:   Imaging Review  Ankle Imaging: Ankle-R DG Complete: Results for orders placed during the  hospital encounter of 07/03/15 DG Ankle Complete Right  Narrative CLINICAL DATA:  Posterior ankle and calcaneus pain. No known injury.  EXAM: RIGHT ANKLE - COMPLETE 3+ VIEW  COMPARISON:  None.  FINDINGS: Bone mineralization is normal. No fracture line or displaced fracture fragment seen. No acute- appearing cortical irregularity or osseous lesion. Ankle mortise is symmetric. No significant arthropathy at the tibiotalar joint space. Mild degenerative spurring noted within the midfoot. Adjacent soft tissues are unremarkable.  IMPRESSION: 1. Perhaps mild degenerative change within the midfoot. No significant degenerative change at the tibiotalar joint space. 2. No acute findings.   Electronically Signed By: Bary Richard M.D. On: 07/09/2015 10:33  Elbow Imaging: Elbow-L DG Complete: Results for orders placed during the hospital encounter of 09/11/21 DG Elbow Complete Left  Narrative CLINICAL DATA:  Fall.  Fall 2 weeks ago  EXAM: LEFT ELBOW - COMPLETE 3+ VIEW  COMPARISON:  None.  FINDINGS: Fracture through the neck of the radial head. Fractures is horizontal and parallel to the articular surface. Fracture  does not enter the articular surface. No dislocation.  IMPRESSION: Horizontal fracture of the neck of the radial head.   Electronically Signed By: Genevive Bi M.D. On: 09/11/2021 16:42  Complexity Note: Imaging results reviewed.                         ROS  Cardiovascular: {Hx; Cardiovascular History:210120525} Pulmonary or Respiratory: {Hx; Pumonary and/or Respiratory History:210120523} Neurological: {Hx; Neurological:210120504} Psychological-Psychiatric: {Hx; Psychological-Psychiatric History:210120512} Gastrointestinal: {Hx; Gastrointestinal:210120527} Genitourinary: {Hx; Genitourinary:210120506} Hematological: {Hx; Hematological:210120510} Endocrine: {Hx; Endocrine history:210120509} Rheumatologic: {Hx;  Rheumatological:210120530} Musculoskeletal: {Hx; Musculoskeletal:210120528} Work History: {Hx; Work history:210120514}  Allergies  Mr. Rabuck is allergic to losartan.  Laboratory Chemistry Profile   Renal Lab Results  Component Value Date   BUN 20 11/20/2022   CREATININE 1.06 11/20/2022   GFRAA >60 01/30/2018   GFRNONAA >60 11/20/2022   PROTEINUR NEGATIVE 07/23/2022     Electrolytes Lab Results  Component Value Date   NA 137 11/20/2022   K 3.8 11/20/2022   CL 105 11/20/2022   CALCIUM 9.7 11/20/2022   MG 1.9 07/23/2022     Hepatic Lab Results  Component Value Date   AST 25 11/20/2022   ALT 26 11/20/2022   ALBUMIN 5.2 (H) 11/20/2022   ALKPHOS 52 11/20/2022   LIPASE 92 (H) 07/23/2022     ID Lab Results  Component Value Date   HIV Non Reactive 07/23/2022   SARSCOV2NAA NEGATIVE 07/23/2022   MRSAPCR NEGATIVE 09/21/2016     Bone No results found for: "VD25OH", "VD125OH2TOT", "XW9604VW0", "JW1191YN8", "25OHVITD1", "25OHVITD2", "25OHVITD3", "TESTOFREE", "TESTOSTERONE"   Endocrine Lab Results  Component Value Date   GLUCOSE 194 (H) 11/20/2022   GLUCOSEU >=500 (A) 07/23/2022   HGBA1C 6.6 (H) 07/23/2022   TSH 0.580 09/20/2016     Neuropathy Lab Results  Component Value Date   HGBA1C 6.6 (H) 07/23/2022   HIV Non Reactive 07/23/2022     CNS No results found for: "COLORCSF", "APPEARCSF", "RBCCOUNTCSF", "WBCCSF", "POLYSCSF", "LYMPHSCSF", "EOSCSF", "PROTEINCSF", "GLUCCSF", "JCVIRUS", "CSFOLI", "IGGCSF", "LABACHR", "ACETBL"   Inflammation (CRP: Acute  ESR: Chronic) Lab Results  Component Value Date   CRP 1.5 (H) 07/23/2022   ESRSEDRATE 2 07/23/2022   LATICACIDVEN 1.4 07/03/2015     Rheumatology No results found for: "RF", "ANA", "LABURIC", "URICUR", "LYMEIGGIGMAB", "LYMEABIGMQN", "HLAB27"   Coagulation Lab Results  Component Value Date   INR 1.1 07/23/2022   LABPROT 13.6 07/23/2022   APTT 27 07/23/2022   PLT 366 11/20/2022     Cardiovascular Lab  Results  Component Value Date   BNP 30.0 11/18/2015   CKTOTAL 841 (H) 01/19/2015   CKMB < 0.5 (L) 08/17/2012   TROPONINI <0.03 01/30/2018   HGB 17.6 (H) 11/20/2022   HCT 51.3 11/20/2022     Screening Lab Results  Component Value Date   SARSCOV2NAA NEGATIVE 07/23/2022   MRSAPCR NEGATIVE 09/21/2016   HIV Non Reactive 07/23/2022     Cancer No results found for: "CEA", "CA125", "LABCA2"   Allergens No results found for: "ALMOND", "APPLE", "ASPARAGUS", "AVOCADO", "BANANA", "BARLEY", "BASIL", "BAYLEAF", "GREENBEAN", "LIMABEAN", "WHITEBEAN", "BEEFIGE", "REDBEET", "BLUEBERRY", "BROCCOLI", "CABBAGE", "MELON", "CARROT", "CASEIN", "CASHEWNUT", "CAULIFLOWER", "CELERY"     Note: Lab results reviewed.  PFSH  Drug: Mr. Denk  reports no history of drug use. Alcohol:  reports that he does not currently use alcohol after a past usage of about 12.0 standard drinks of alcohol per week. Tobacco:  reports that he quit smoking about 7 months ago. His smoking use included cigarettes.  He started smoking about 20 years ago. He has a 20 pack-year smoking history. He has never used smokeless tobacco. Medical:  has a past medical history of Acute pancreatitis (07/02/2015), CAD (coronary artery disease), Chronic back pain, Diastolic dysfunction, Family history of diabetes mellitus, Family history of heart attack, Family history of stomach cancer, Gout, Hypertension, Hypertriglyceridemia, Remote Alcohol abuse, Tobacco abuse, and Type 2 diabetes mellitus (HCC) (07/02/2015). Family: family history includes Diabetes in his father, sister, and sister; Heart attack (age of onset: 17 - 18) in his father; Stomach cancer in his father. He was adopted.  Past Surgical History:  Procedure Laterality Date   CORONARY STENT INTERVENTION N/A 07/23/2022   Procedure: CORONARY STENT INTERVENTION;  Surgeon: Iran Ouch, MD;  Location: [facility] INVASIVE CV LAB;  Service: Cardiovascular;  Laterality: N/A;   LEFT HEART CATH AND  CORONARY ANGIOGRAPHY N/A 07/23/2022   Procedure: LEFT HEART CATH AND CORONARY ANGIOGRAPHY;  Surgeon: Iran Ouch, MD;  Location: [facility] INVASIVE CV LAB;  Service: Cardiovascular;  Laterality: N/A;   NO PAST SURGERIES     Active Ambulatory Problems    Diagnosis Date Noted   ARF (acute renal failure) (HCC) 01/19/2015   Leukocytosis 01/19/2015   Pancreatitis, acute 07/03/2015   Insulin dependent diabetes mellitus 06/01/2015   Alcohol abuse    Acute on chronic pancreatitis (HCC) 09/20/2016   Hypertriglyceridemia 01/28/2011   Atypical chest pain 09/25/2016   Liver lesion 09/25/2016   Polysubstance abuse (HCC) 09/25/2016   Left-sided chest pain 07/23/2022   Uncontrolled type 2 diabetes mellitus with hyperglycemia, with long-term current use of insulin (HCC) 07/23/2022   Elevated lipase 07/23/2022   Hypotension 07/23/2022   Family history of premature CAD 07/23/2022   History of substance use disorder 07/23/2022   Alcohol use disorder 07/23/2022   Tobacco use disorder 02/15/2016   Hypokalemia 07/23/2022   Non-ST elevation (NSTEMI) myocardial infarction (HCC) 07/23/2022   Essential hypertension 01/28/2011   Suicidal ideation 11/20/2022   Chronic pain syndrome 03/23/2023   Pharmacologic therapy 03/23/2023   Disorder of skeletal system 03/23/2023   Problems influencing health status 03/23/2023   Type 2 diabetes mellitus treated with insulin (HCC) 03/04/2016   Abnormal liver enzymes 03/12/2017   Anesthesia of skin 07/25/2016   Arthritis 03/23/2023   Chronic fatigue 11/14/2016   Depression 09/28/2014   Diabetic polyneuropathy associated with type 2 diabetes mellitus (HCC) 11/14/2016   Difficulty sleeping 11/14/2016   GERD (gastroesophageal reflux disease) 01/28/2011   Gout 01/28/2011   MRSA (methicillin resistant staphylococcus aureus) pneumonia (HCC) 03/04/2016   MRSA bacteremia 03/04/2016   Neck pain 09/25/2012   Chronic low back pain 01/28/2011   Tobacco abuse 01/28/2011    AKI (acute kidney injury) (HCC) 03/04/2016   Glucose intolerance (impaired glucose tolerance) 01/28/2011   Chronic pancreatitis due to chronic alcoholism (HCC) 03/23/2023   Elevated C-reactive protein (CRP) 03/23/2023   Resolved Ambulatory Problems    Diagnosis Date Noted   No Resolved Ambulatory Problems   Past Medical History:  Diagnosis Date   Acute pancreatitis 07/02/2015   CAD (coronary artery disease)    Chronic back pain    Diastolic dysfunction    Family history of diabetes mellitus    Family history of heart attack    Family history of stomach cancer    Hypertension    Type 2 diabetes mellitus (HCC) 07/02/2015   Constitutional Exam  General appearance: Well nourished, well developed, and well hydrated. In no apparent acute distress There were no vitals filed for  this visit. BMI Assessment: Estimated body mass index is 26.35 kg/m as calculated from the following:   Height as of 12/19/22: 6\' 2"  (1.88 m).   Weight as of 12/19/22: 205 lb 4 oz (93.1 kg).  BMI interpretation table: BMI level Category Range association with higher incidence of chronic pain  <18 kg/m2 Underweight   18.5-24.9 kg/m2 Ideal body weight   25-29.9 kg/m2 Overweight Increased incidence by 20%  30-34.9 kg/m2 Obese (Class I) Increased incidence by 68%  35-39.9 kg/m2 Severe obesity (Class II) Increased incidence by 136%  >40 kg/m2 Extreme obesity (Class III) Increased incidence by 254%   Patient's current BMI Ideal Body weight  There is no height or weight on file to calculate BMI. Patient weight not recorded   BMI Readings from Last 4 Encounters:  12/19/22 26.35 kg/m  10/30/22 26.50 kg/m  09/18/22 26.12 kg/m  08/13/22 26.04 kg/m   Wt Readings from Last 4 Encounters:  12/19/22 205 lb 4 oz (93.1 kg)  10/30/22 203 lb 9.6 oz (92.4 kg)  09/18/22 203 lb 6.4 oz (92.3 kg)  08/13/22 200 lb 1.6 oz (90.8 kg)    Psych/Mental status: Alert, oriented x 3 (person, place, & time)       Eyes:  PERLA Respiratory: No evidence of acute respiratory distress  Assessment  Primary Diagnosis & Pertinent Problem List: The primary encounter diagnosis was Chronic pain syndrome. Diagnoses of Pharmacologic therapy, Disorder of skeletal system, Problems influencing health status, Chronic pancreatitis due to chronic alcoholism (HCC), and Elevated C-reactive protein (CRP) were also pertinent to this visit.  Visit Diagnosis (New problems to examiner): 1. Chronic pain syndrome   2. Pharmacologic therapy   3. Disorder of skeletal system   4. Problems influencing health status   5. Chronic pancreatitis due to chronic alcoholism (HCC)   6. Elevated C-reactive protein (CRP)    Plan of Care (Initial workup plan)  Note: Mr. Brittan was reminded that as per protocol, today's visit has been an evaluation only. We have not taken over the patient's controlled substance management.  Problem-specific plan: No problem-specific Assessment & Plan notes found for this encounter.  Lab Orders  No laboratory test(s) ordered today   Imaging Orders  No imaging studies ordered today   Referral Orders  No referral(s) requested today   Procedure Orders    No procedure(s) ordered today   Pharmacotherapy (current): Medications ordered:  No orders of the defined types were placed in this encounter.  Medications administered during this visit: Vladimir Creeks "[name]" had no medications administered during this visit.   Analgesic Pharmacotherapy:  Opioid Analgesics: For patients currently taking or requesting to take opioid analgesics, in accordance with [facility] Guidelines, we will assess their risks and indications for the use of these substances. After completing our evaluation, we may offer recommendations, but we no longer take patients for medication management. The prescribing physician will ultimately decide, based on his/her training and level of comfort whether to adopt any of  the recommendations, including whether or not to prescribe such medicines.  Membrane stabilizer: To be determined at a later time  Muscle relaxant: To be determined at a later time  NSAID: To be determined at a later time  Other analgesic(s): To be determined at a later time   Interventional management options: Mr. Wallett was informed that there is no guarantee that he would be a candidate for interventional therapies. The decision will be based on the results of diagnostic studies, as well as  Mr. Pickard's risk profile.  Procedure(s) under consideration:  Pending results of ordered studies      Interventional Therapies  Risk Factors  Considerations  Medical Comorbidities:     Planned  Pending:      Under consideration:   Pending   Completed:   None at this time   Therapeutic  Palliative (PRN) options:   None established   Completed by other providers:   None reported       Provider-requested follow-up: No follow-ups on file.  Future Appointments  Date Time Provider Department Center  03/24/2023  2:00 PM Delano Metz, MD [facility] None  06/20/2023  8:00 AM Iran Ouch, MD [facility] None    Duration of encounter: *** minutes.  Total time on encounter, as per AMA guidelines included both the face-to-face and non-face-to-face time personally spent by the physician and/or other qualified health care professional(s) on the day of the encounter (includes time in activities that require the physician or other qualified health care professional and does not include time in activities normally performed by clinical staff). Physician's time may include the following activities when performed: Preparing to see the patient (e.g., pre-charting review of records, searching for previously ordered imaging, lab work, and nerve conduction tests) Review of prior analgesic pharmacotherapies. Reviewing PMP Interpreting ordered tests (e.g., lab work, imaging, nerve conduction  tests) Performing post-procedure evaluations, including interpretation of diagnostic procedures Obtaining and/or reviewing separately obtained history Performing a medically appropriate examination and/or evaluation Counseling and educating the patient/family/caregiver Ordering medications, tests, or procedures Referring and communicating with other health care professionals (when not separately reported) Documenting clinical information in the electronic or other health record Independently interpreting results (not separately reported) and communicating results to the patient/ family/caregiver Care coordination (not separately reported)  Note by: Oswaldo Done, MD (TTS technology used. I apologize for any typographical errors that were not detected and corrected.) Date: 03/24/2023; Time: 1:30 PM

## 2023-03-24 ENCOUNTER — Ambulatory Visit (HOSPITAL_BASED_OUTPATIENT_CLINIC_OR_DEPARTMENT_OTHER): Payer: MEDICAID | Admitting: Pain Medicine

## 2023-03-24 DIAGNOSIS — G894 Chronic pain syndrome: Secondary | ICD-10-CM

## 2023-03-24 DIAGNOSIS — R7982 Elevated C-reactive protein (CRP): Secondary | ICD-10-CM

## 2023-03-24 DIAGNOSIS — G8929 Other chronic pain: Secondary | ICD-10-CM

## 2023-03-24 DIAGNOSIS — F1020 Alcohol dependence, uncomplicated: Secondary | ICD-10-CM

## 2023-03-24 DIAGNOSIS — Z79899 Other long term (current) drug therapy: Secondary | ICD-10-CM

## 2023-03-24 DIAGNOSIS — Z789 Other specified health status: Secondary | ICD-10-CM

## 2023-03-24 DIAGNOSIS — Z91199 Patient's noncompliance with other medical treatment and regimen due to unspecified reason: Secondary | ICD-10-CM

## 2023-03-24 DIAGNOSIS — M899 Disorder of bone, unspecified: Secondary | ICD-10-CM

## 2023-04-10 NOTE — Patient Instructions (Signed)

## 2023-04-10 NOTE — Progress Notes (Unsigned)
Patient: Donald Hart  Service Category: E/M  Provider: Oswaldo Done, MD  DOB: June 21, 1971  DOS: 04/14/2023  Referring Provider: Jodi Marble, NP  MRN: [id number]  Setting: Ambulatory outpatient  PCP: Jodi Marble, NP  Type: New Patient  Specialty: Interventional Pain Management    Location: Office  Delivery: Face-to-face     Primary Reason(s) for Visit: Encounter for initial evaluation of one or more chronic problems (new to examiner) potentially causing chronic pain, and posing a threat to normal musculoskeletal function. (Level of risk: High) CC: No chief complaint on file.  HPI  Donald Hart is a 52 y.o. year old, male patient, who comes for the first time to our practice referred by Jodi Marble, NP for our initial evaluation of his chronic pain. He has Pancreatitis, acute; Insulin dependent diabetes mellitus; Alcohol abuse; ARF (acute renal failure) (HCC); Leukocytosis; Acute on chronic pancreatitis (HCC); Hypertriglyceridemia; Atypical chest pain; Liver lesion; Polysubstance abuse (HCC); Left-sided chest pain; Uncontrolled type 2 diabetes mellitus with hyperglycemia, with long-term current use of insulin (HCC); Elevated lipase; Hypotension; Family history of premature CAD; History of substance use disorder; Alcohol use disorder; Tobacco use disorder; Hypokalemia; Non-ST elevation (NSTEMI) myocardial infarction [facility]); Essential hypertension; Suicidal ideation; Chronic pain syndrome; Pharmacologic therapy; Disorder of skeletal system; Problems influencing health status; Type 2 diabetes mellitus treated with insulin (HCC); Abnormal liver enzymes; Anesthesia of skin; Arthritis; Chronic fatigue; Depression; Diabetic polyneuropathy associated with type 2 diabetes mellitus (HCC); Difficulty sleeping; GERD (gastroesophageal reflux disease); Gout; MRSA (methicillin resistant staphylococcus aureus) pneumonia (HCC); MRSA bacteremia; Neck pain; Chronic low back pain; Tobacco abuse; AKI  (acute kidney injury) (HCC); Glucose intolerance (impaired glucose tolerance); Chronic pancreatitis due to chronic alcoholism (HCC); and Elevated C-reactive protein (CRP) on their problem list. Today he comes in for evaluation of his No chief complaint on file.  Pain Assessment: Location:     Radiating:   Onset:   Duration:   Quality:   Severity:  /10 (subjective, self-reported pain score)  Effect on ADL:   Timing:   Modifying factors:   BP:    HR:    Onset and Duration: {Hx; Onset and Duration:210120511} Cause of pain: {Hx; Cause:210120521} Severity: {Pain Severity:210120502} Timing: {Symptoms; Timing:210120501} Aggravating Factors: {Causes; Aggravating pain factors:210120507} Alleviating Factors: {Causes; Alleviating Factors:210120500} Associated Problems: {Hx; Associated problems:210120515} Quality of Pain: {Hx; Symptom quality or Descriptor:210120531} Previous Examinations or Tests: {Hx; Previous examinations or test:210120529} Previous Treatments: {Hx; Previous Treatment:210120503}  Donald Hart is being evaluated for possible interventional pain management therapies for the treatment of his chronic pain. (03/24/2023) NO-SHOW to initial evaluation.  ***  Donald Hart has been informed that this initial visit was an evaluation only.  On the follow up appointment I will go over the results, including ordered tests and available interventional therapies. At that time he will have the opportunity to decide whether to proceed with offered therapies or not. In the event that Donald Hart prefers avoiding interventional options, this will conclude our involvement in the case.  Medication management recommendations may be provided upon request.  Patient informed that diagnostic tests may be ordered to assist in identifying underlying causes, narrow the list of differential diagnoses and aid in determining candidacy for (or contraindications to) planned therapeutic interventions.  Historic  Controlled Substance Pharmacotherapy Review  PMP and historical list of controlled substances: Gabapentin 800 mg tablet, 1 tab p.o. 3 times daily (90/month) (# 270) (last filled on 02/19/2023); hydrocodone/APAP 5/325 (# 12) (last filled on 09/11/2021) Most recently prescribed opioid analgesics:  None MME/day: 0 mg/day  Historical Monitoring: The patient  reports no history of drug use. List of prior UDS Testing: Lab Results  Component Value Date   MDMA POSITIVE (A) 11/20/2022   MDMA NONE DETECTED 07/23/2022   MDMA NONE DETECTED 11/18/2015   MDMA NONE DETECTED 01/19/2015   MDMA NEGATIVE 09/22/2013   COCAINSCRNUR NONE DETECTED 11/20/2022   COCAINSCRNUR NONE DETECTED 07/23/2022   COCAINSCRNUR NONE DETECTED 11/18/2015   COCAINSCRNUR NONE DETECTED 01/19/2015   COCAINSCRNUR POSITIVE 09/22/2013   PCPSCRNUR NONE DETECTED 11/20/2022   PCPSCRNUR NONE DETECTED 07/23/2022   PCPSCRNUR NONE DETECTED 11/18/2015   PCPSCRNUR NONE DETECTED 01/19/2015   PCPSCRNUR NEGATIVE 09/22/2013   THCU POSITIVE (A) 11/20/2022   THCU POSITIVE (A) 07/23/2022   THCU NONE DETECTED 11/18/2015   THCU NONE DETECTED 01/19/2015   THCU POSITIVE 09/22/2013   ETH <10 11/20/2022   ETH <10 07/23/2022   ETH 145 (H) 01/30/2018   ETH <5 11/18/2015   Historical Background Evaluation: [place] PMP: PDMP reviewed during this encounter. Review of the past 73-months conducted.             PMP NARX Score Report:  Narcotic: 020 Sedative: 010 Stimulant: 000 [place] Department of public safety, offender search: Engineer, mining Information) Non-contributory Risk Assessment Profile: Aberrant behavior: None observed or detected today Risk factors for fatal opioid overdose: None identified today PMP NARX Overdose Risk Score: 330 Fatal overdose hazard ratio (HR): Calculation deferred Non-fatal overdose hazard ratio (HR): Calculation deferred Risk of opioid abuse or dependence: 0.7-3.0% with doses <= 36 MME/day and 6.1-26% with doses >= 120  MME/day. Substance use disorder (SUD) risk level: See below Personal History of Substance Abuse (SUD-Substance use disorder):  Alcohol:    Illegal Drugs:    Rx Drugs:    ORT Risk Level calculation:    ORT Scoring interpretation table:  Score <3 = Low Risk for SUD  Score between 4-7 = Moderate Risk for SUD  Score >8 = High Risk for Opioid Abuse   PHQ-2 Depression Scale:  Total score:    PHQ-2 Scoring interpretation table: (Score and probability of major depressive disorder)  Score 0 = No depression  Score 1 = 15.4% Probability  Score 2 = 21.1% Probability  Score 3 = 38.4% Probability  Score 4 = 45.5% Probability  Score 5 = 56.4% Probability  Score 6 = 78.6% Probability   PHQ-9 Depression Scale:  Total score:    PHQ-9 Scoring interpretation table:  Score 0-4 = No depression  Score 5-9 = Mild depression  Score 10-14 = Moderate depression  Score 15-19 = Moderately severe depression  Score 20-27 = Severe depression (2.4 times higher risk of SUD and 2.89 times higher risk of overuse)   Pharmacologic Plan: As per protocol, I have not taken over any controlled substance management, pending the results of ordered tests and/or consults.            Initial impression: Pending review of available data and ordered tests.  Meds   Current Outpatient Medications:    albuterol (PROVENTIL HFA;VENTOLIN HFA) 108 (90 Base) MCG/ACT inhaler, Inhale 1-2 puffs every 6 (six) hours as needed into the lungs for wheezing or shortness of breath., Disp: 1 Inhaler, Rfl: 0   amLODipine (NORVASC) 10 MG tablet, Take 10 mg by mouth daily., Disp: , Rfl:    aspirin EC 81 MG tablet, Take 1 tablet (81 mg total) by mouth daily. Swallow whole., Disp: 30 tablet, Rfl: 12   atorvastatin (LIPITOR) 80 MG tablet, Take  80 mg by mouth daily., Disp: , Rfl:    carvedilol (COREG) 25 MG tablet, Take 1 tablet (25 mg total) by mouth 2 (two) times daily., Disp: 180 tablet, Rfl: 3   clopidogrel (PLAVIX) 75 MG tablet, Take 1  tablet (75 mg total) by mouth daily., Disp: 90 tablet, Rfl: 3   fenofibrate (TRICOR) 145 MG tablet, Take 145 mg by mouth daily., Disp: , Rfl:    fenofibrate 160 MG tablet, Take 1 tablet (160 mg total) by mouth daily. (Patient not taking: Reported on 11/20/2022), Disp: 30 tablet, Rfl: 0   folic acid (FOLVITE) 1 MG tablet, Take 1 mg by mouth daily. (Patient not taking: Reported on 12/19/2022), Disp: , Rfl:    gabapentin (NEURONTIN) 800 MG tablet, Take 800 mg by mouth 3 (three) times daily., Disp: , Rfl:    hydrochlorothiazide (HYDRODIURIL) 50 MG tablet, Take 50 mg by mouth daily., Disp: , Rfl:    hydrOXYzine (ATARAX) 10 MG tablet, Take 10 mg by mouth 3 (three) times daily. (Patient not taking: Reported on 12/19/2022), Disp: , Rfl:    hyoscyamine (LEVSIN, ANASPAZ) 0.125 MG tablet, Take 1 tablet (0.125 mg total) by mouth every 6 (six) hours as needed for cramping. (Patient not taking: Reported on 09/18/2022), Disp: 30 tablet, Rfl: 0   JARDIANCE 25 MG TABS tablet, Take 25 mg by mouth daily., Disp: , Rfl:    losartan (COZAAR) 100 MG tablet, Take 1 tablet (100 mg total) by mouth daily., Disp: 30 tablet, Rfl: 0   magnesium oxide (MAG-OX) 400 MG tablet, Take 400 mg by mouth daily. (Patient not taking: Reported on 09/18/2022), Disp: , Rfl:    Multiple Vitamin (MULTIVITAMIN WITH MINERALS) TABS tablet, Take 1 tablet by mouth daily. (Patient not taking: Reported on 09/18/2022), Disp: , Rfl:    nicotine (NICODERM CQ - DOSED IN MG/24 HOURS) 21 mg/24hr patch, Place 1 patch (21 mg total) onto the skin daily., Disp: 28 patch, Rfl: 0   omega-3 acid ethyl esters (LOVAZA) 1 g capsule, Take 2 g by mouth 2 (two) times daily. (Patient not taking: Reported on 09/18/2022), Disp: , Rfl:    omeprazole (PRILOSEC) 20 MG capsule, Take 20 mg by mouth daily as needed., Disp: , Rfl:    spironolactone (ALDACTONE) 25 MG tablet, Take 25 mg by mouth every morning. (Patient not taking: Reported on 12/19/2022), Disp: , Rfl:    thiamine 100 MG  tablet, Take 1 tablet (100 mg total) by mouth daily. (Patient not taking: Reported on 09/18/2022), Disp: , Rfl:   Imaging Review  Ankle Imaging: Ankle-R DG Complete: Results for orders placed during the hospital encounter of 07/03/15 DG Ankle Complete Right  Narrative CLINICAL DATA:  Posterior ankle and calcaneus pain. No known injury.  EXAM: RIGHT ANKLE - COMPLETE 3+ VIEW  COMPARISON:  None.  FINDINGS: Bone mineralization is normal. No fracture line or displaced fracture fragment seen. No acute- appearing cortical irregularity or osseous lesion. Ankle mortise is symmetric. No significant arthropathy at the tibiotalar joint space. Mild degenerative spurring noted within the midfoot. Adjacent soft tissues are unremarkable.  IMPRESSION: 1. Perhaps mild degenerative change within the midfoot. No significant degenerative change at the tibiotalar joint space. 2. No acute findings.   Electronically Signed By: Bary Richard M.D. On: 07/09/2015 10:33  Elbow Imaging: Elbow-L DG Complete: Results for orders placed during the hospital encounter of 09/11/21 DG Elbow Complete Left  Narrative CLINICAL DATA:  Fall.  Fall 2 weeks ago  EXAM: LEFT ELBOW - COMPLETE 3+  VIEW  COMPARISON:  None.  FINDINGS: Fracture through the neck of the radial head. Fractures is horizontal and parallel to the articular surface. Fracture does not enter the articular surface. No dislocation.  IMPRESSION: Horizontal fracture of the neck of the radial head.   Electronically Signed By: Genevive Bi M.D. On: 09/11/2021 16:42  Complexity Note: Imaging results reviewed.                         ROS  Cardiovascular: {Hx; Cardiovascular History:210120525} Pulmonary or Respiratory: {Hx; Pumonary and/or Respiratory History:210120523} Neurological: {Hx; Neurological:210120504} Psychological-Psychiatric: {Hx; Psychological-Psychiatric History:210120512} Gastrointestinal: {Hx;  Gastrointestinal:210120527} Genitourinary: {Hx; Genitourinary:210120506} Hematological: {Hx; Hematological:210120510} Endocrine: {Hx; Endocrine history:210120509} Rheumatologic: {Hx; Rheumatological:210120530} Musculoskeletal: {Hx; Musculoskeletal:210120528} Work History: {Hx; Work history:210120514}  Allergies  Mr. Gaffin is allergic to losartan.  Laboratory Chemistry Profile   Renal Lab Results  Component Value Date   BUN 20 11/20/2022   CREATININE 1.06 11/20/2022   GFRAA >60 01/30/2018   GFRNONAA >60 11/20/2022   PROTEINUR NEGATIVE 07/23/2022     Electrolytes Lab Results  Component Value Date   NA 137 11/20/2022   K 3.8 11/20/2022   CL 105 11/20/2022   CALCIUM 9.7 11/20/2022   MG 1.9 07/23/2022     Hepatic Lab Results  Component Value Date   AST 25 11/20/2022   ALT 26 11/20/2022   ALBUMIN 5.2 (H) 11/20/2022   ALKPHOS 52 11/20/2022   LIPASE 92 (H) 07/23/2022     ID Lab Results  Component Value Date   HIV Non Reactive 07/23/2022   SARSCOV2NAA NEGATIVE 07/23/2022   MRSAPCR NEGATIVE 09/21/2016     Bone No results found for: "VD25OH", "VD125OH2TOT", "JX9147WG9", "FA2130QM5", "25OHVITD1", "25OHVITD2", "25OHVITD3", "TESTOFREE", "TESTOSTERONE"   Endocrine Lab Results  Component Value Date   GLUCOSE 194 (H) 11/20/2022   GLUCOSEU >=500 (A) 07/23/2022   HGBA1C 6.6 (H) 07/23/2022   TSH 0.580 09/20/2016     Neuropathy Lab Results  Component Value Date   HGBA1C 6.6 (H) 07/23/2022   HIV Non Reactive 07/23/2022     CNS No results found for: "COLORCSF", "APPEARCSF", "RBCCOUNTCSF", "WBCCSF", "POLYSCSF", "LYMPHSCSF", "EOSCSF", "PROTEINCSF", "GLUCCSF", "JCVIRUS", "CSFOLI", "IGGCSF", "LABACHR", "ACETBL"   Inflammation (CRP: Acute  ESR: Chronic) Lab Results  Component Value Date   CRP 1.5 (H) 07/23/2022   ESRSEDRATE 2 07/23/2022   LATICACIDVEN 1.4 07/03/2015     Rheumatology No results found for: "RF", "ANA", "LABURIC", "URICUR", "LYMEIGGIGMAB",  "LYMEABIGMQN", "HLAB27"   Coagulation Lab Results  Component Value Date   INR 1.1 07/23/2022   LABPROT 13.6 07/23/2022   APTT 27 07/23/2022   PLT 366 11/20/2022     Cardiovascular Lab Results  Component Value Date   BNP 30.0 11/18/2015   CKTOTAL 841 (H) 01/19/2015   CKMB < 0.5 (L) 08/17/2012   TROPONINI <0.03 01/30/2018   HGB 17.6 (H) 11/20/2022   HCT 51.3 11/20/2022     Screening Lab Results  Component Value Date   SARSCOV2NAA NEGATIVE 07/23/2022   MRSAPCR NEGATIVE 09/21/2016   HIV Non Reactive 07/23/2022     Cancer No results found for: "CEA", "CA125", "LABCA2"   Allergens No results found for: "ALMOND", "APPLE", "ASPARAGUS", "AVOCADO", "BANANA", "BARLEY", "BASIL", "BAYLEAF", "GREENBEAN", "LIMABEAN", "WHITEBEAN", "BEEFIGE", "REDBEET", "BLUEBERRY", "BROCCOLI", "CABBAGE", "MELON", "CARROT", "CASEIN", "CASHEWNUT", "CAULIFLOWER", "CELERY"     Note: Lab results reviewed.  PFSH  Drug: Mr. Hyun  reports no history of drug use. Alcohol:  reports that he does not currently use alcohol after a past usage  of about 12.0 standard drinks of alcohol per week. Tobacco:  reports that he quit smoking about 8 months ago. His smoking use included cigarettes. He started smoking about 20 years ago. He has a 20 pack-year smoking history. He has never used smokeless tobacco. Medical:  has a past medical history of Acute pancreatitis (07/02/2015), CAD (coronary artery disease), Chronic back pain, Diastolic dysfunction, Family history of diabetes mellitus, Family history of heart attack, Family history of stomach cancer, Gout, Hypertension, Hypertriglyceridemia, Remote Alcohol abuse, Tobacco abuse, and Type 2 diabetes mellitus (HCC) (07/02/2015). Family: family history includes Diabetes in his father, sister, and sister; Heart attack (age of onset: 35 - 68) in his father; Stomach cancer in his father. He was adopted.  Past Surgical History:  Procedure Laterality Date   CORONARY STENT  INTERVENTION N/A 07/23/2022   Procedure: CORONARY STENT INTERVENTION;  Surgeon: Iran Ouch, MD;  Location: [facility] INVASIVE CV LAB;  Service: Cardiovascular;  Laterality: N/A;   LEFT HEART CATH AND CORONARY ANGIOGRAPHY N/A 07/23/2022   Procedure: LEFT HEART CATH AND CORONARY ANGIOGRAPHY;  Surgeon: Iran Ouch, MD;  Location: [facility] INVASIVE CV LAB;  Service: Cardiovascular;  Laterality: N/A;   NO PAST SURGERIES     Active Ambulatory Problems    Diagnosis Date Noted   ARF (acute renal failure) (HCC) 01/19/2015   Leukocytosis 01/19/2015   Pancreatitis, acute 07/03/2015   Insulin dependent diabetes mellitus 06/01/2015   Alcohol abuse    Acute on chronic pancreatitis (HCC) 09/20/2016   Hypertriglyceridemia 01/28/2011   Atypical chest pain 09/25/2016   Liver lesion 09/25/2016   Polysubstance abuse (HCC) 09/25/2016   Left-sided chest pain 07/23/2022   Uncontrolled type 2 diabetes mellitus with hyperglycemia, with long-term current use of insulin (HCC) 07/23/2022   Elevated lipase 07/23/2022   Hypotension 07/23/2022   Family history of premature CAD 07/23/2022   History of substance use disorder 07/23/2022   Alcohol use disorder 07/23/2022   Tobacco use disorder 02/15/2016   Hypokalemia 07/23/2022   Non-ST elevation (NSTEMI) myocardial infarction (HCC) 07/23/2022   Essential hypertension 01/28/2011   Suicidal ideation 11/20/2022   Chronic pain syndrome 03/23/2023   Pharmacologic therapy 03/23/2023   Disorder of skeletal system 03/23/2023   Problems influencing health status 03/23/2023   Type 2 diabetes mellitus treated with insulin (HCC) 03/04/2016   Abnormal liver enzymes 03/12/2017   Anesthesia of skin 07/25/2016   Arthritis 03/23/2023   Chronic fatigue 11/14/2016   Depression 09/28/2014   Diabetic polyneuropathy associated with type 2 diabetes mellitus (HCC) 11/14/2016   Difficulty sleeping 11/14/2016   GERD (gastroesophageal reflux disease) 01/28/2011   Gout 01/28/2011    MRSA (methicillin resistant staphylococcus aureus) pneumonia (HCC) 03/04/2016   MRSA bacteremia 03/04/2016   Neck pain 09/25/2012   Chronic low back pain 01/28/2011   Tobacco abuse 01/28/2011   AKI (acute kidney injury) (HCC) 03/04/2016   Glucose intolerance (impaired glucose tolerance) 01/28/2011   Chronic pancreatitis due to chronic alcoholism (HCC) 03/23/2023   Elevated C-reactive protein (CRP) 03/23/2023   Resolved Ambulatory Problems    Diagnosis Date Noted   No Resolved Ambulatory Problems   Past Medical History:  Diagnosis Date   Acute pancreatitis 07/02/2015   CAD (coronary artery disease)    Chronic back pain    Diastolic dysfunction    Family history of diabetes mellitus    Family history of heart attack    Family history of stomach cancer    Hypertension    Type 2 diabetes mellitus (HCC) 07/02/2015  Constitutional Exam  General appearance: Well nourished, well developed, and well hydrated. In no apparent acute distress There were no vitals filed for this visit. BMI Assessment: Estimated body mass index is 26.35 kg/m as calculated from the following:   Height as of 12/19/22: 6\' 2"  (1.88 m).   Weight as of 12/19/22: 205 lb 4 oz (93.1 kg).  BMI interpretation table: BMI level Category Range association with higher incidence of chronic pain  <18 kg/m2 Underweight   18.5-24.9 kg/m2 Ideal body weight   25-29.9 kg/m2 Overweight Increased incidence by 20%  30-34.9 kg/m2 Obese (Class I) Increased incidence by 68%  35-39.9 kg/m2 Severe obesity (Class II) Increased incidence by 136%  >40 kg/m2 Extreme obesity (Class III) Increased incidence by 254%   Patient's current BMI Ideal Body weight  There is no height or weight on file to calculate BMI. Patient weight not recorded   BMI Readings from Last 4 Encounters:  12/19/22 26.35 kg/m  10/30/22 26.50 kg/m  09/18/22 26.12 kg/m  08/13/22 26.04 kg/m   Wt Readings from Last 4 Encounters:  12/19/22 205 lb 4 oz (93.1  kg)  10/30/22 203 lb 9.6 oz (92.4 kg)  09/18/22 203 lb 6.4 oz (92.3 kg)  08/13/22 200 lb 1.6 oz (90.8 kg)    Psych/Mental status: Alert, oriented x 3 (person, place, & time)       Eyes: PERLA Respiratory: No evidence of acute respiratory distress  Assessment  Primary Diagnosis & Pertinent Problem List: The primary encounter diagnosis was Chronic pain syndrome. Diagnoses of Pharmacologic therapy, Disorder of skeletal system, and Problems influencing health status were also pertinent to this visit.  Visit Diagnosis (New problems to examiner): 1. Chronic pain syndrome   2. Pharmacologic therapy   3. Disorder of skeletal system   4. Problems influencing health status    Plan of Care (Initial workup plan)  Note: Mr. Portela was reminded that as per protocol, today's visit has been an evaluation only. We have not taken over the patient's controlled substance management.  Problem-specific plan: No problem-specific Assessment & Plan notes found for this encounter.  Lab Orders  No laboratory test(s) ordered today   Imaging Orders  No imaging studies ordered today   Referral Orders  No referral(s) requested today   Procedure Orders    No procedure(s) ordered today   Pharmacotherapy (current): Medications ordered:  No orders of the defined types were placed in this encounter.  Medications administered during this visit: Vladimir Creeks "[name]" had no medications administered during this visit.   Analgesic Pharmacotherapy:  Opioid Analgesics: For patients currently taking or requesting to take opioid analgesics, in accordance with [facility] Guidelines, we will assess their risks and indications for the use of these substances. After completing our evaluation, we may offer recommendations, but we no longer take patients for medication management. The prescribing physician will ultimately decide, based on his/her training and level of comfort whether to adopt any  of the recommendations, including whether or not to prescribe such medicines.  Membrane stabilizer: To be determined at a later time  Muscle relaxant: To be determined at a later time  NSAID: To be determined at a later time  Other analgesic(s): To be determined at a later time   Interventional management options: Mr. Picklesimer was informed that there is no guarantee that he would be a candidate for interventional therapies. The decision will be based on the results of diagnostic studies, as well as Mr. Pittmon's risk profile.  Procedure(s) under consideration:  Pending results of ordered studies      Interventional Therapies  Risk Factors  Considerations  Medical Comorbidities:  SUD (ETOH, THC, Tobacco)  Depression  suicidal ideation  Hx. MI  CAD  HTN  T2IDDM  CHF     Planned  Pending:      Under consideration:   Pending   Completed:   None at this time   Therapeutic  Palliative (PRN) options:   None established   Completed by other providers:   None reported      Provider-requested follow-up: No follow-ups on file.  Future Appointments  Date Time Provider Department Center  04/14/2023  9:00 AM Delano Metz, MD [facility] None  06/20/2023  8:00 AM Iran Ouch, MD [facility] None    Duration of encounter: *** minutes.  Total time on encounter, as per AMA guidelines included both the face-to-face and non-face-to-face time personally spent by the physician and/or other qualified health care professional(s) on the day of the encounter (includes time in activities that require the physician or other qualified health care professional and does not include time in activities normally performed by clinical staff). Physician's time may include the following activities when performed: Preparing to see the patient (e.g., pre-charting review of records, searching for previously ordered imaging, lab work, and nerve conduction tests) Review of prior analgesic  pharmacotherapies. Reviewing PMP Interpreting ordered tests (e.g., lab work, imaging, nerve conduction tests) Performing post-procedure evaluations, including interpretation of diagnostic procedures Obtaining and/or reviewing separately obtained history Performing a medically appropriate examination and/or evaluation Counseling and educating the patient/family/caregiver Ordering medications, tests, or procedures Referring and communicating with other health care professionals (when not separately reported) Documenting clinical information in the electronic or other health record Independently interpreting results (not separately reported) and communicating results to the patient/ family/caregiver Care coordination (not separately reported)  Note by: Oswaldo Done, MD (TTS technology used. I apologize for any typographical errors that were not detected and corrected.) Date: 04/14/2023; Time: 1:38 PM

## 2023-04-14 ENCOUNTER — Ambulatory Visit (HOSPITAL_BASED_OUTPATIENT_CLINIC_OR_DEPARTMENT_OTHER): Payer: MEDICAID | Admitting: Pain Medicine

## 2023-04-14 DIAGNOSIS — Z789 Other specified health status: Secondary | ICD-10-CM

## 2023-04-14 DIAGNOSIS — G894 Chronic pain syndrome: Secondary | ICD-10-CM

## 2023-04-14 DIAGNOSIS — Z79899 Other long term (current) drug therapy: Secondary | ICD-10-CM

## 2023-04-14 DIAGNOSIS — M899 Disorder of bone, unspecified: Secondary | ICD-10-CM

## 2023-04-14 DIAGNOSIS — G8929 Other chronic pain: Secondary | ICD-10-CM

## 2023-06-20 ENCOUNTER — Ambulatory Visit: Payer: MEDICAID | Attending: Cardiovascular Disease | Admitting: Cardiovascular Disease

## 2023-06-20 NOTE — Progress Notes (Deleted)
Cardiology Office Note   Date:  06/20/2023   ID:  Suyog, Penix [birth year], MRN [id number]  PCP:  Jodi Marble, NP  Cardiologist:   Lorine Bears, MD   No chief complaint on file.     History of Present Illness: Donald Hart is a 52 y.o. male who presents for a follow-up visit regarding coronary artery disease. He has known history of pancreatitis, chronic hypertriglyceridemia, remote alcohol abuse, tobacco use, diabetes mellitus, essential hypertension, hyperlipidemia, gout, chronic back pain and GERD.  He was hospitalized in January 2024 with non-STEMI.  Cardiac catheterization showed severe one-vessel coronary artery disease involving the mid left circumflex with moderately calcified coronary vessels.  I performed successful PCI and drug-eluting stent placement to the left circumflex.  Brilinta was subsequently switched to clopidogrel due to dyspnea.  Echocardiogram showed normal LV systolic function.  He attended cardiac rehab.  Past Medical History:  Diagnosis Date   Acute pancreatitis 07/02/2015   hospitalization at [facility]   CAD (coronary artery disease)    a. 07/2022 NSTEMI/PCI: LM nl, LAD 25m, 50d, RI nl, LCX 60m (2.75x22 Onyx Frontier DES), RCA 20p/m, RPDA 30. EF 55-65%.   Chronic back pain    Diastolic dysfunction    a. 01/2016 Echo: EF 60-65%, GrI DD. Mild LVH, nl RV fxn, mildly dil LA; b. 07/2022 Echo: EF 60-65%, no rwma, nl RV fxn.   Family history of diabetes mellitus    sister   Family history of heart attack    Father.    Family history of stomach cancer    Gout    Hypertension    Hypertriglyceridemia    Remote Alcohol abuse    Tobacco abuse    Type 2 diabetes mellitus (HCC) 07/02/2015    Past Surgical History:  Procedure Laterality Date   CORONARY STENT INTERVENTION N/A 07/23/2022   Procedure: CORONARY STENT INTERVENTION;  Surgeon: Iran Ouch, MD;  Location: [facility] INVASIVE CV LAB;  Service: Cardiovascular;  Laterality:  N/A;   LEFT HEART CATH AND CORONARY ANGIOGRAPHY N/A 07/23/2022   Procedure: LEFT HEART CATH AND CORONARY ANGIOGRAPHY;  Surgeon: Iran Ouch, MD;  Location: [facility] INVASIVE CV LAB;  Service: Cardiovascular;  Laterality: N/A;   NO PAST SURGERIES       Current Outpatient Medications  Medication Sig Dispense Refill   albuterol (PROVENTIL HFA;VENTOLIN HFA) 108 (90 Base) MCG/ACT inhaler Inhale 1-2 puffs every 6 (six) hours as needed into the lungs for wheezing or shortness of breath. 1 Inhaler 0   amLODipine (NORVASC) 10 MG tablet Take 10 mg by mouth daily.     aspirin EC 81 MG tablet Take 1 tablet (81 mg total) by mouth daily. Swallow whole. 30 tablet 12   atorvastatin (LIPITOR) 80 MG tablet Take 80 mg by mouth daily.     carvedilol (COREG) 25 MG tablet Take 1 tablet (25 mg total) by mouth 2 (two) times daily. 180 tablet 3   clopidogrel (PLAVIX) 75 MG tablet Take 1 tablet (75 mg total) by mouth daily. 90 tablet 3   fenofibrate (TRICOR) 145 MG tablet Take 145 mg by mouth daily.     fenofibrate 160 MG tablet Take 1 tablet (160 mg total) by mouth daily. (Patient not taking: Reported on 11/20/2022) 30 tablet 0   folic acid (FOLVITE) 1 MG tablet Take 1 mg by mouth daily. (Patient not taking: Reported on 12/19/2022)     gabapentin (NEURONTIN) 800 MG tablet Take 800 mg by mouth 3 (three) times  daily.     hydrochlorothiazide (HYDRODIURIL) 50 MG tablet Take 50 mg by mouth daily.     hydrOXYzine (ATARAX) 10 MG tablet Take 10 mg by mouth 3 (three) times daily. (Patient not taking: Reported on 12/19/2022)     hyoscyamine (LEVSIN, ANASPAZ) 0.125 MG tablet Take 1 tablet (0.125 mg total) by mouth every 6 (six) hours as needed for cramping. (Patient not taking: Reported on 09/18/2022) 30 tablet 0   JARDIANCE 25 MG TABS tablet Take 25 mg by mouth daily.     losartan (COZAAR) 100 MG tablet Take 1 tablet (100 mg total) by mouth daily. 30 tablet 0   magnesium oxide (MAG-OX) 400 MG tablet Take 400 mg by mouth daily.  (Patient not taking: Reported on 09/18/2022)     Multiple Vitamin (MULTIVITAMIN WITH MINERALS) TABS tablet Take 1 tablet by mouth daily. (Patient not taking: Reported on 09/18/2022)     nicotine (NICODERM CQ - DOSED IN MG/24 HOURS) 21 mg/24hr patch Place 1 patch (21 mg total) onto the skin daily. 28 patch 0   omega-3 acid ethyl esters (LOVAZA) 1 g capsule Take 2 g by mouth 2 (two) times daily. (Patient not taking: Reported on 09/18/2022)     omeprazole (PRILOSEC) 20 MG capsule Take 20 mg by mouth daily as needed.     spironolactone (ALDACTONE) 25 MG tablet Take 25 mg by mouth every morning. (Patient not taking: Reported on 12/19/2022)     thiamine 100 MG tablet Take 1 tablet (100 mg total) by mouth daily. (Patient not taking: Reported on 09/18/2022)     No current facility-administered medications for this visit.    Allergies:   Losartan    Social History:  The patient  reports that he quit smoking about 10 months ago. His smoking use included cigarettes. He started smoking about 20 years ago. He has a 20 pack-year smoking history. He has never used smokeless tobacco. He reports that he does not currently use alcohol after a past usage of about 12.0 standard drinks of alcohol per week. He reports that he does not use drugs.   Family History:  The patient's ***family history includes Diabetes in his father, sister, and sister; Heart attack (age of onset: 74 - 66) in his father; Stomach cancer in his father. He was adopted.    ROS:  Please see the history of present illness.   Otherwise, review of systems are positive for {NONE DEFAULTED:18576}.   All other systems are reviewed and negative.    PHYSICAL EXAM: VS:  There were no vitals taken for this visit. , BMI There is no height or weight on file to calculate BMI. GEN: Well nourished, well developed, in no acute distress  HEENT: normal  Neck: no JVD, carotid bruits, or masses Cardiac: ***RRR; no murmurs, rubs, or gallops,no edema  Respiratory:   clear to auscultation bilaterally, normal work of breathing GI: soft, nontender, nondistended, + BS MS: no deformity or atrophy  Skin: warm and dry, no rash Neuro:  Strength and sensation are intact Psych: euthymic mood, full affect   EKG:  EKG {ACTION; IS/IS ZOX:[id number]} ordered today. The ekg ordered today demonstrates ***   Recent Labs: 07/23/2022: Magnesium 1.9 11/20/2022: ALT 26; BUN 20; Creatinine, Ser 1.06; Hemoglobin 17.6; Platelets 366; Potassium 3.8; Sodium 137    Lipid Panel    Component Value Date/Time   CHOL 104 07/23/2022 0530   TRIG 221 (H) 07/23/2022 0530   HDL 27 (L) 07/23/2022 0530   CHOLHDL 3.9 07/23/2022  0530   VLDL 44 (H) 07/23/2022 0530   LDLCALC 33 07/23/2022 0530      Wt Readings from Last 3 Encounters:  12/19/22 205 lb 4 oz (93.1 kg)  10/30/22 203 lb 9.6 oz (92.4 kg)  09/18/22 203 lb 6.4 oz (92.3 kg)      Other studies Reviewed: Additional studies/ records that were reviewed today include: ***. Review of the above records demonstrates: ***      No data to display            ASSESSMENT AND PLAN:  1.  Coronary artery disease involving native coronary arteries without angina:  2.  Essential hypertension:  3.  Mixed hyperlipidemia:  4.  Diabetes mellitus:    Disposition:   FU with *** in {gen number 1-61:096045} {Days to years:10300}  Signed,  Lorine Bears, MD  06/20/2023 7:51 AM    [facility]

## 2023-07-22 ENCOUNTER — Encounter: Payer: Self-pay | Admitting: *Deleted

## 2023-07-25 ENCOUNTER — Other Ambulatory Visit: Payer: Self-pay | Admitting: Cardiovascular Disease

## 2023-08-14 ENCOUNTER — Ambulatory Visit: Payer: MEDICAID | Admitting: Family Medicine

## 2023-09-11 ENCOUNTER — Encounter: Payer: Self-pay | Admitting: Physician Assistant

## 2023-09-11 ENCOUNTER — Ambulatory Visit: Payer: MEDICAID | Admitting: Physician Assistant

## 2023-09-11 VITALS — BP 120/74 | HR 75 | Temp 97.8°F | Resp 16 | Ht 74.0 in | Wt 197.0 lb

## 2023-09-11 DIAGNOSIS — G629 Polyneuropathy, unspecified: Secondary | ICD-10-CM

## 2023-09-11 DIAGNOSIS — E1165 Type 2 diabetes mellitus with hyperglycemia: Secondary | ICD-10-CM

## 2023-09-11 DIAGNOSIS — F411 Generalized anxiety disorder: Secondary | ICD-10-CM | POA: Diagnosis not present

## 2023-09-11 DIAGNOSIS — Z7689 Persons encountering health services in other specified circumstances: Secondary | ICD-10-CM

## 2023-09-11 DIAGNOSIS — F331 Major depressive disorder, recurrent, moderate: Secondary | ICD-10-CM | POA: Diagnosis not present

## 2023-09-11 LAB — POCT GLYCOSYLATED HEMOGLOBIN (HGB A1C): Hemoglobin A1C: 7 % — AB (ref 4.0–5.6)

## 2023-09-11 MED ORDER — ESCITALOPRAM OXALATE 5 MG PO TABS: 5.0000 mg | ORAL_TABLET | Freq: Every day | ORAL | 2 refills | Status: AC

## 2023-09-11 NOTE — Progress Notes (Signed)
 [facility] [address] [place], [place] [address]  Internal MEDICINE  Office Visit Note  Patient Name: Donald Hart  [id number]  [id number]  Date of Service: 09/20/2023   Complaints/HPI Pt is here for establishment of PCP. Chief Complaint  Patient presents with   New Patient (Initial Visit)    depression   Diabetes   Hypertension   HPI Pt is here to establish care -Was seeing [facility] -Missed last cardiology appt and needs to reschedule. Hx of NSTEMI last year -DM2 takes Jardiance -Reports hx of alcohol abuse, quit February 14, 2018. But now likes to drink sodas a lot, lots of mtn dew and trying to get away from this -HTN well controlled -Taking a lot of gabapentin--unclear who prescribed this.Taking this for neuropathy. Will refer to neurology -Been feeling depressed, does not like going out in public. Doesn't like interacting with people. Has previously tried wellbutrin but had S/E. Unsure which other meds he has tried -Planning to see [facility] but was told it is first come first serve and would like referral somewhere to schedule appts where he would not have to arrive early and wait with other people. Discussed starting medication in meantime -does pizza delivery  Flowsheet Row Office Visit from 09/11/2023 in [facility]  PHQ-2 Total Score 1        Current Medication: Outpatient Encounter Medications as of 09/11/2023  Medication Sig Note   amLODipine (NORVASC) 10 MG tablet Take 10 mg by mouth daily.    aspirin EC 81 MG tablet Take 1 tablet (81 mg total) by mouth daily. Swallow whole.    atorvastatin (LIPITOR) 80 MG tablet Take 80 mg by mouth daily.    carvedilol (COREG) 25 MG tablet Take 1 tablet (25 mg total) by mouth 2 (two) times daily.    escitalopram (LEXAPRO) 5 MG tablet Take 1 tablet (5 mg total) by mouth daily.    fenofibrate (TRICOR) 145 MG tablet Take 145 mg by mouth daily.    gabapentin (NEURONTIN) 800 MG  tablet Take 800 mg by mouth 3 (three) times daily.    JARDIANCE 25 MG TABS tablet Take 25 mg by mouth daily.    omeprazole (PRILOSEC) 20 MG capsule Take 20 mg by mouth daily as needed.    [DISCONTINUED] clopidogrel (PLAVIX) 75 MG tablet Take 1 tablet (75 mg total) by mouth daily.    [DISCONTINUED] albuterol (PROVENTIL HFA;VENTOLIN HFA) 108 (90 Base) MCG/ACT inhaler Inhale 1-2 puffs every 6 (six) hours as needed into the lungs for wheezing or shortness of breath.    [DISCONTINUED] fenofibrate 160 MG tablet Take 1 tablet (160 mg total) by mouth daily. (Patient not taking: Reported on 11/20/2022)    [DISCONTINUED] folic acid (FOLVITE) 1 MG tablet Take 1 mg by mouth daily. (Patient not taking: Reported on 12/19/2022)    [DISCONTINUED] hydrochlorothiazide (HYDRODIURIL) 50 MG tablet Take 50 mg by mouth daily. (Patient not taking: Reported on 09/11/2023)    [DISCONTINUED] hydrOXYzine (ATARAX) 10 MG tablet Take 10 mg by mouth 3 (three) times daily. (Patient not taking: Reported on 12/19/2022)    [DISCONTINUED] hyoscyamine (LEVSIN, ANASPAZ) 0.125 MG tablet Take 1 tablet (0.125 mg total) by mouth every 6 (six) hours as needed for cramping. (Patient not taking: Reported on 09/18/2022)    [DISCONTINUED] losartan (COZAAR) 100 MG tablet Take 1 tablet (100 mg total) by mouth daily.    [DISCONTINUED] magnesium oxide (MAG-OX) 400 MG tablet Take 400 mg by mouth daily. (Patient not taking:  Reported on 09/18/2022)    [DISCONTINUED] Multiple Vitamin (MULTIVITAMIN WITH MINERALS) TABS tablet Take 1 tablet by mouth daily. (Patient not taking: Reported on 09/18/2022)    [DISCONTINUED] nicotine (NICODERM CQ - DOSED IN MG/24 HOURS) 21 mg/24hr patch Place 1 patch (21 mg total) onto the skin daily. 12/19/2022: PRN   [DISCONTINUED] omega-3 acid ethyl esters (LOVAZA) 1 g capsule Take 2 g by mouth 2 (two) times daily. (Patient not taking: Reported on 09/18/2022)    [DISCONTINUED] spironolactone (ALDACTONE) 25 MG tablet Take 25 mg by mouth  every morning. (Patient not taking: Reported on 12/19/2022)    [DISCONTINUED] thiamine 100 MG tablet Take 1 tablet (100 mg total) by mouth daily. (Patient not taking: Reported on 09/18/2022)    No facility-administered encounter medications on file as of 09/11/2023.    Surgical History: Past Surgical History:  Procedure Laterality Date   CORONARY STENT INTERVENTION N/A 07/23/2022   Procedure: CORONARY STENT INTERVENTION;  Surgeon: Iran Ouch, MD;  Location: [facility] INVASIVE CV LAB;  Service: Cardiovascular;  Laterality: N/A;   LEFT HEART CATH AND CORONARY ANGIOGRAPHY N/A 07/23/2022   Procedure: LEFT HEART CATH AND CORONARY ANGIOGRAPHY;  Surgeon: Iran Ouch, MD;  Location: [facility] INVASIVE CV LAB;  Service: Cardiovascular;  Laterality: N/A;   NO PAST SURGERIES      Medical History: Past Medical History:  Diagnosis Date   Acute pancreatitis 07/02/2015   hospitalization at [facility]   CAD (coronary artery disease)    a. 07/2022 NSTEMI/PCI: LM nl, LAD 63m, 50d, RI nl, LCX 30m (2.75x22 Onyx Frontier DES), RCA 20p/m, RPDA 30. EF 55-65%.   Chronic back pain    Diastolic dysfunction    a. 01/2016 Echo: EF 60-65%, GrI DD. Mild LVH, nl RV fxn, mildly dil LA; b. 07/2022 Echo: EF 60-65%, no rwma, nl RV fxn.   Family history of diabetes mellitus    sister   Family history of heart attack    Father.    Family history of stomach cancer    Gout    Hypertension    Hypertriglyceridemia    Remote Alcohol abuse    Tobacco abuse    Type 2 diabetes mellitus (HCC) 07/02/2015    Family History: Family History  Adopted: Yes  Problem Relation Age of Onset   Heart attack Father 76 - 4   Stomach cancer Father    Diabetes Father    Diabetes Sister    Diabetes Sister     Social History   Socioeconomic History   Marital status: Legally Separated    Spouse name: Not on file   Number of children: Not on file   Years of education: Not on file   Highest education level: 12th grade  Occupational  History   Not on file  Tobacco Use   Smoking status: Former    Current packs/day: 0.00    Average packs/day: 1 pack/day for 20.0 years (20.0 ttl pk-yrs)    Types: Cigarettes, E-cigarettes    Start date: 07/23/2002    Quit date: 07/23/2022    Years since quitting: 1.1   Smokeless tobacco: Never   Tobacco comments:    Currently -ano cigarettes since heart attack vapes regularly throughout the week.  Wants to quit  Vaping Use   Vaping status: Every Day  Substance and Sexual Activity   Alcohol use: Not Currently    Alcohol/week: 12.0 standard drinks of alcohol    Types: 12 Cans of beer per week    Comment: none since  2018   Drug use: No   Sexual activity: Yes    Partners: Male    Birth control/protection: None  Other Topics Concern   Not on file  Social History Narrative   [place].  Works in a Naval architect.  Does not routinely exercise.   Social Drivers of Health   Financial Resource Strain: Patient Declined (08/13/2023)   Overall Financial Resource Strain (CARDIA)    Difficulty of Paying Living Expenses: Patient declined  Food Insecurity: No Food Insecurity (08/13/2023)   Hunger Vital Sign    Worried About Running Out of Food in the Last Year: Never true    Ran Out of Food in the Last Year: Never true  Transportation Needs: Unmet Transportation Needs (08/13/2023)   PRAPARE - Transportation    Lack of Transportation (Medical): Yes    Lack of Transportation (Non-Medical): Yes  Physical Activity: Sufficiently Active (08/13/2023)   Exercise Vital Sign    Days of Exercise per Week: 5 days    Minutes of Exercise per Session: 150+ min  Stress: Stress Concern Present (08/13/2023)   [employer] of Occupational Health - Occupational Stress Questionnaire    Feeling of Stress : Very much  Social Connections: Moderately Isolated (08/13/2023)   Social Connection and Isolation Panel [NHANES]    Frequency of Communication with Friends and Family:  Twice a week    Frequency of Social Gatherings with Friends and Family: Twice a week    Attends Religious Services: 1 to 4 times per year    Active Member of [employer] or Organizations: No    Attends Engineer, structural: Not on file    Marital Status: Divorced  Intimate Partner Violence: Not At Risk (07/23/2022)   Humiliation, Afraid, Rape, and Kick questionnaire    Fear of Current or Ex-Partner: No    Emotionally Abused: No    Physically Abused: No    Sexually Abused: No     Review of Systems  Constitutional:  Negative for chills, fatigue and unexpected weight change.  HENT:  Positive for postnasal drip. Negative for congestion, rhinorrhea, sneezing and sore throat.   Eyes:  Negative for redness.  Respiratory:  Negative for cough, chest tightness and shortness of breath.   Cardiovascular:  Negative for chest pain and palpitations.  Gastrointestinal:  Negative for abdominal pain, constipation, diarrhea, nausea and vomiting.  Genitourinary:  Negative for dysuria and frequency.  Musculoskeletal:  Negative for arthralgias, back pain, joint swelling and neck pain.  Skin:  Negative for rash.  Neurological:  Positive for numbness. Negative for tremors.  Hematological:  Negative for adenopathy. Does not bruise/bleed easily.  Psychiatric/Behavioral:  Positive for behavioral problems (Depression). Negative for sleep disturbance and suicidal ideas. The patient is nervous/anxious.     Vital Signs: BP 120/74   Pulse 75   Temp 97.8 F (36.6 C)   Resp 16   Ht 6\' 2"  (1.88 m)   Wt 197 lb (89.4 kg)   SpO2 95%   BMI 25.29 kg/m    Physical Exam Vitals and nursing note reviewed.  Constitutional:      General: He is not in acute distress.    Appearance: Normal appearance. He is well-developed. He is not diaphoretic.  HENT:     Head: Normocephalic and atraumatic.  Neck:     Thyroid: No thyromegaly.     Vascular: No JVD.     Trachea: No tracheal deviation.  Cardiovascular:      Rate and  Rhythm: Normal rate and regular rhythm.     Heart sounds: Normal heart sounds. No murmur heard.    No friction rub. No gallop.  Pulmonary:     Effort: Pulmonary effort is normal. No respiratory distress.     Breath sounds: [name] or rales.  Chest:     Chest wall: No tenderness.  Abdominal:     General: Bowel sounds are normal.  Musculoskeletal:        General: Normal range of motion.     Cervical back: Normal range of motion and neck supple.  Lymphadenopathy:     Cervical: No cervical adenopathy.  Skin:    General: Skin is warm and dry.  Neurological:     Mental Status: He is alert.  Psychiatric:        Thought Content: Thought content normal.        Judgment: Judgment normal.       Assessment/Plan: 1. Type 2 diabetes mellitus with hyperglycemia, unspecified whether long term insulin use (HCC) (Primary) - POCT HgB A1C is 7.0, will continue to work on diet and exercise and continue Jardiance  2. Neuropathy Taking high dose gabapentin from outside provider, Will refer to neurology - Ambulatory referral to Neurology  3. Moderate episode of recurrent major depressive disorder (HCC) Will start lexapro and titrate as needed, Will refer to psych - Ambulatory referral to Psychiatry - escitalopram (LEXAPRO) 5 MG tablet; Take 1 tablet (5 mg total) by mouth daily.  Dispense: 30 tablet; Refill: 2  4. GAD (generalized anxiety disorder) - Ambulatory referral to Psychiatry - escitalopram (LEXAPRO) 5 MG tablet; Take 1 tablet (5 mg total) by mouth daily.  Dispense: 30 tablet; Refill: 2  5. Encounter to establish care Will request records   General Counseling: giuliano preece understanding of the findings of todays visit and agrees with plan of treatment. I have discussed any further diagnostic evaluation that may be needed or ordered today. We also reviewed his medications today. he has been encouraged to call the office with any questions or concerns that should  arise related to todays visit.    Counseling:    Orders Placed This Encounter  Procedures   Ambulatory referral to Neurology   Ambulatory referral to Psychiatry   POCT HgB A1C    Meds ordered this encounter  Medications   escitalopram (LEXAPRO) 5 MG tablet    Sig: Take 1 tablet (5 mg total) by mouth daily.    Dispense:  30 tablet    Refill:  2     This patient was seen by Lynn Ito, PA-C in collaboration with Dr. Beverely Risen as a part of collaborative care agreement.   Time spent:35 Minutes

## 2023-09-12 ENCOUNTER — Other Ambulatory Visit: Payer: Self-pay | Admitting: Cardiovascular Disease

## 2023-09-15 ENCOUNTER — Telehealth: Payer: Self-pay | Admitting: Physician Assistant

## 2023-09-15 NOTE — Telephone Encounter (Signed)
 Awaiting 09/11/23 office notes for Neurology referral-Toni

## 2023-09-24 ENCOUNTER — Telehealth: Payer: Self-pay | Admitting: Physician Assistant

## 2023-09-24 NOTE — Telephone Encounter (Signed)
 Neurology referral sent via [facility] to [facility]. Notified patient. Gave telephone # [phone]

## 2023-10-06 ENCOUNTER — Other Ambulatory Visit: Payer: Self-pay | Admitting: Physician Assistant

## 2023-10-06 DIAGNOSIS — F411 Generalized anxiety disorder: Secondary | ICD-10-CM

## 2023-10-06 DIAGNOSIS — F331 Major depressive disorder, recurrent, moderate: Secondary | ICD-10-CM

## 2023-10-23 ENCOUNTER — Ambulatory Visit: Payer: MEDICAID | Admitting: Physician Assistant

## 2024-01-01 ENCOUNTER — Other Ambulatory Visit: Payer: Self-pay | Admitting: Cardiovascular Disease

## 2024-01-08 ENCOUNTER — Telehealth: Payer: Self-pay | Admitting: Physician Assistant

## 2024-01-08 NOTE — Telephone Encounter (Signed)
 Per [place] w/ [facility], referral has been closed due to patient not returning calls -Andree

## 2024-04-12 ENCOUNTER — Ambulatory Visit: Payer: MEDICAID | Admitting: Psychiatry

## 2024-04-19 ENCOUNTER — Other Ambulatory Visit: Payer: Self-pay | Admitting: Nurse Practitioner

## 2024-04-19 DIAGNOSIS — Z87891 Personal history of nicotine dependence: Secondary | ICD-10-CM

## 2024-04-26 ENCOUNTER — Ambulatory Visit: Payer: MEDICAID

## 2024-05-10 ENCOUNTER — Ambulatory Visit (INDEPENDENT_AMBULATORY_CARE_PROVIDER_SITE_OTHER): Payer: MEDICAID | Admitting: Urology

## 2024-05-10 ENCOUNTER — Encounter: Payer: Self-pay | Admitting: Urology

## 2024-05-10 VITALS — BP 142/79 | HR 59 | Ht 74.0 in | Wt 190.0 lb

## 2024-05-10 DIAGNOSIS — N401 Enlarged prostate with lower urinary tract symptoms: Secondary | ICD-10-CM

## 2024-05-10 DIAGNOSIS — R3911 Hesitancy of micturition: Secondary | ICD-10-CM

## 2024-05-10 DIAGNOSIS — R300 Dysuria: Secondary | ICD-10-CM | POA: Diagnosis not present

## 2024-05-10 LAB — BLADDER SCAN AMB NON-IMAGING: Scan Result: 26

## 2024-05-10 MED ORDER — TAMSULOSIN HCL 0.4 MG PO CAPS: 0.4000 mg | ORAL_CAPSULE | Freq: Every day | ORAL | 0 refills | Status: DC

## 2024-05-10 NOTE — Progress Notes (Signed)
 05/10/2024 10:54 AM   Donald Hart, Donald Hart  Referring provider: Debarah Catheryn PARAS, FNP [address] [place],  [place] [address]  Chief Complaint  Patient presents with   Dysuria    HPI: Donald Hart is a 53 y.o. male referred for evaluation of lower abdominal pain and lower urinary tract symptoms   2-year history of bothersome lower urinary tract symptoms including urinary hesitancy, weak urinary stream and postvoid dribbling.  Notes dysuria on occasions.  Denies gross hematuria IPSS today 24/35 Has also noted mild lower abdominal discomfort bilateral groin region with 2 palpable masses.  CT angio chest/abdomen/pelvis 07/2022 showed no GU abnormalities/calculi and bilateral inguinal hernias Also complains of chronic lumbosacral pain PSA August 2024 was 0.5   PMH: Past Medical History:  Diagnosis Date   Acute pancreatitis 07/02/2015   hospitalization at [facility]   CAD (coronary artery disease)    a. 07/2022 NSTEMI/PCI: LM nl, LAD 83m, 50d, RI nl, LCX 6m (2.75x22 Onyx Frontier DES), RCA 20p/m, RPDA 30. EF 55-65%.   Chronic back pain    Diastolic dysfunction    a. 01/2016 Echo: EF 60-65%, GrI DD. Mild LVH, nl RV fxn, mildly dil LA; b. 07/2022 Echo: EF 60-65%, no rwma, nl RV fxn.   Family history of diabetes mellitus    sister   Family history of heart attack    Father.    Family history of stomach cancer    Gout    Hypertension    Hypertriglyceridemia    Remote Alcohol abuse    Tobacco abuse    Type 2 diabetes mellitus (HCC) 07/02/2015    Surgical History: Past Surgical History:  Procedure Laterality Date   CORONARY STENT INTERVENTION N/A 07/23/2022   Procedure: CORONARY STENT INTERVENTION;  Surgeon: Darron Deatrice LABOR, MD;  Location: [facility] INVASIVE CV LAB;  Service: Cardiovascular;  Laterality: N/A;   LEFT HEART CATH AND CORONARY ANGIOGRAPHY N/A 07/23/2022   Procedure: LEFT HEART CATH AND CORONARY ANGIOGRAPHY;  Surgeon: Darron Deatrice LABOR, MD;   Location: [facility] INVASIVE CV LAB;  Service: Cardiovascular;  Laterality: N/A;   NO PAST SURGERIES      Home Medications:  Allergies as of 05/10/2024       Reactions   Losartan     cramping        Medication List        Accurate as of May 10, 2024 10:54 AM. If you have any questions, ask your nurse or doctor.          amLODipine  10 MG tablet Commonly known as: NORVASC  Take 10 mg by mouth daily.   aspirin  EC 81 MG tablet Take 1 tablet (81 mg total) by mouth daily. Swallow whole.   atorvastatin  80 MG tablet Commonly known as: LIPITOR Take 80 mg by mouth daily.   carvedilol  25 MG tablet Commonly known as: COREG  Take 1 tablet (25 mg total) by mouth 2 (two) times daily.   clopidogrel  75 MG tablet Commonly known as: PLAVIX  TAKE 1 TABLET BY MOUTH EVERY DAY   escitalopram  5 MG tablet Commonly known as: Lexapro  Take 1 tablet (5 mg total) by mouth daily.   fenofibrate  145 MG tablet Commonly known as: TRICOR  Take 145 mg by mouth daily.   gabapentin  800 MG tablet Commonly known as: NEURONTIN  Take 800 mg by mouth 3 (three) times daily.   hydrochlorothiazide 25 MG tablet Commonly known as: HYDRODIURIL HYDROCHLOROTHIAZIDE 25 MG TABS   Jardiance  25 MG Tabs tablet Generic drug: empagliflozin  Take 25  mg by mouth daily.   omeprazole 20 MG capsule Commonly known as: PRILOSEC Take 20 mg by mouth daily as needed.        Allergies:  Allergies  Allergen Reactions   Losartan      cramping    Family History: Family History  Adopted: Yes  Problem Relation Age of Onset   Heart attack Father 81 - 68   Stomach cancer Father    Diabetes Father    Diabetes Sister    Diabetes Sister     Social History:  reports that he quit smoking about 21 months ago. His smoking use included cigarettes and e-cigarettes. He started smoking about 21 years ago. He has a 20 pack-year smoking history. He has never used smokeless tobacco. He reports that he does not currently use  alcohol after a past usage of about 12.0 standard drinks of alcohol per week. He reports that he does not use drugs.   Physical Exam: BP (!) 142/79   Pulse (!) 59   Ht 6' 2 (1.88 m)   Wt 190 lb (86.2 kg)   BMI 24.39 kg/m   Constitutional:  Alert, No acute distress. HEENT: [place] AT Respiratory: Normal respiratory effort, no increased work of breathing. GU: Phallus without lesions.  Testes descended bilateral without masses or tenderness.  Bilateral inguinal hernias.  Declined DRE Psychiatric: Normal mood and affect.   Assessment & Plan:    1.  BPH with LUTS Severe lower urinary tract symptoms PVR today 26 mL Trial tamsulosin 0.4 mg daily PA follow-up 1 month symptom reassessment  2.  Bilateral inguinal hernias Minimally symptomatic Instructed to request general surgery evaluation to PCP should he develop symptoms  3.  Lumbosacral pain Musculoskeletal etiology Recommend PCP evaluation   Glendia JAYSON Barba, MD  [place] [address] [place], [place] [address] [phone]

## 2024-06-09 ENCOUNTER — Ambulatory Visit: Payer: MEDICAID | Admitting: Physician Assistant

## 2024-06-09 VITALS — BP 147/89 | HR 78 | Ht 74.0 in | Wt 190.0 lb

## 2024-06-09 DIAGNOSIS — K4020 Bilateral inguinal hernia, without obstruction or gangrene, not specified as recurrent: Secondary | ICD-10-CM | POA: Diagnosis not present

## 2024-06-09 DIAGNOSIS — N401 Enlarged prostate with lower urinary tract symptoms: Secondary | ICD-10-CM | POA: Diagnosis not present

## 2024-06-09 DIAGNOSIS — N500 Atrophy of testis: Secondary | ICD-10-CM | POA: Diagnosis not present

## 2024-06-09 DIAGNOSIS — R3911 Hesitancy of micturition: Secondary | ICD-10-CM | POA: Diagnosis not present

## 2024-06-09 DIAGNOSIS — R300 Dysuria: Secondary | ICD-10-CM | POA: Diagnosis not present

## 2024-06-09 LAB — BLADDER SCAN AMB NON-IMAGING

## 2024-06-09 NOTE — Patient Instructions (Signed)

## 2024-06-09 NOTE — Progress Notes (Signed)
 06/09/2024 10:24 AM   Donald Hart Level Nov 20, 1970 [id number]  CC: Chief Complaint  Patient presents with   Follow-up   Bladder Prolapse   HPI: Donald Hart is a 53 y.o. male with PMH diabetes on Jardiance , CAD, bilateral inguinal hernias, chronic lumbosacral pain, and lower abdominal pain with LUTS who presents today for recheck on Flomax .   Today he reports no change in his obstructive voiding symptoms on Flomax .  He has continued dysuria.  He describes bilateral inguinal pain, R>L.  He feels that his right testicle is not in his scrotum.  IPSS 22/terrible as below, previously 24. PVR 50mL.   IPSS     Row Name 06/09/24 1000         International Prostate Symptom Score   How often have you had the sensation of not emptying your bladder? Almost always     How often have you had to urinate less than every two hours? Less than 1 in 5 times     How often have you found you stopped and started again several times when you urinated? Almost always     How often have you found it difficult to postpone urination? Not at All     How often have you had a weak urinary stream? Almost always     How often have you had to strain to start urination? Almost always     How many times did you typically get up at night to urinate? 1 Time     Total IPSS Score 22       Quality of Life due to urinary symptoms   If you were to spend the rest of your life with your urinary condition just the way it is now how would you feel about that? Terrible         PMH: Past Medical History:  Diagnosis Date   Acute pancreatitis 07/02/2015   hospitalization at [facility]   CAD (coronary artery disease)    a. 07/2022 NSTEMI/PCI: LM nl, LAD 64m, 50d, RI nl, LCX 70m (2.75x22 Onyx Frontier DES), RCA 20p/m, RPDA 30. EF 55-65%.   Chronic back pain    Diastolic dysfunction    a. 01/2016 Echo: EF 60-65%, GrI DD. Mild LVH, nl RV fxn, mildly dil LA; b. 07/2022 Echo: EF 60-65%, no rwma, nl RV fxn.   Family  history of diabetes mellitus    sister   Family history of heart attack    Father.    Family history of stomach cancer    Gout    Hypertension    Hypertriglyceridemia    Remote Alcohol abuse    Tobacco abuse    Type 2 diabetes mellitus (HCC) 07/02/2015    Surgical History: Past Surgical History:  Procedure Laterality Date   CORONARY STENT INTERVENTION N/A 07/23/2022   Procedure: CORONARY STENT INTERVENTION;  Surgeon: Darron Deatrice LABOR, MD;  Location: [facility] INVASIVE CV LAB;  Service: Cardiovascular;  Laterality: N/A;   LEFT HEART CATH AND CORONARY ANGIOGRAPHY N/A 07/23/2022   Procedure: LEFT HEART CATH AND CORONARY ANGIOGRAPHY;  Surgeon: Darron Deatrice LABOR, MD;  Location: [facility] INVASIVE CV LAB;  Service: Cardiovascular;  Laterality: N/A;   NO PAST SURGERIES      Home Medications:  Allergies as of 06/09/2024       Reactions   Ticagrelor     Other Reaction(s): Brillinta associated Dyspnea   Losartan     cramping        Medication List  Accurate as of June 09, 2024 10:24 AM. If you have any questions, ask your nurse or doctor.          amLODipine  10 MG tablet Commonly known as: NORVASC  Take 10 mg by mouth daily.   aspirin  EC 81 MG tablet Take 1 tablet (81 mg total) by mouth daily. Swallow whole.   atorvastatin  80 MG tablet Commonly known as: LIPITOR Take 80 mg by mouth daily.   carvedilol  25 MG tablet Commonly known as: COREG  Take 1 tablet (25 mg total) by mouth 2 (two) times daily.   clopidogrel  75 MG tablet Commonly known as: PLAVIX  TAKE 1 TABLET BY MOUTH EVERY DAY   escitalopram  5 MG tablet Commonly known as: Lexapro  Take 1 tablet (5 mg total) by mouth daily.   fenofibrate  145 MG tablet Commonly known as: TRICOR  Take 145 mg by mouth daily.   gabapentin  800 MG tablet Commonly known as: NEURONTIN  Take 800 mg by mouth 3 (three) times daily.   hydrochlorothiazide 25 MG tablet Commonly known as: HYDRODIURIL HYDROCHLOROTHIAZIDE 25 MG TABS    Jardiance  25 MG Tabs tablet Generic drug: empagliflozin  Take 25 mg by mouth daily.   omeprazole 20 MG capsule Commonly known as: PRILOSEC Take 20 mg by mouth daily as needed.   tamsulosin  0.4 MG Caps capsule Commonly known as: FLOMAX  Take 1 capsule (0.4 mg total) by mouth daily.        Allergies:  Allergies  Allergen Reactions   Ticagrelor      Other Reaction(s): Brillinta associated Dyspnea   Losartan      cramping    Family History: Family History  Adopted: Yes  Problem Relation Age of Onset   Heart attack Father 9 - 34   Stomach cancer Father    Diabetes Father    Diabetes Sister    Diabetes Sister     Social History:   reports that he quit smoking about 22 months ago. His smoking use included cigarettes and e-cigarettes. He started smoking about 21 years ago. He has a 20 pack-year smoking history. He has never used smokeless tobacco. He reports that he does not currently use alcohol after a past usage of about 12.0 standard drinks of alcohol per week. He reports that he does not use drugs.  Physical Exam: BP (!) 147/89 (BP Location: Left Arm, Patient Position: Sitting, Cuff Size: Normal)   Pulse 78   Ht 6' 2 (1.88 m)   Wt 190 lb (86.2 kg)   SpO2 99%   BMI 24.39 kg/m   Constitutional:  Alert and oriented, no acute distress, nontoxic appearing HEENT: [place], AT Cardiovascular: No clubbing, cyanosis, or edema Respiratory: Normal respiratory effort, no increased work of breathing GI: Small, reducible, bilateral inguinal hernias.  Right hernia is tender, but there is no surrounding edema or erythema GU: Bilateral descended testicles, tender bilateral spermatic cords.  Bilateral testicular atrophy, R>L. Skin: No rashes, bruises or suspicious lesions Neurologic: Grossly intact, no focal deficits, moving all 4 extremities Psychiatric: Normal mood and affect  Laboratory Data: Results for orders placed or performed in visit on 06/09/24  Bladder Scan (Post Void  Residual) in office   Collection Time: 06/09/24 10:16 AM  Result Value Ref Range   Scan Result 50ml    Assessment & Plan:   1. Benign prostatic hyperplasia with urinary hesitancy (Primary) No reported improvement in his obstructive voiding symptoms on tamsulosin .  I recommended cystoscopy for further evaluation and he agreed.  PVR is appropriate. - Bladder Scan (Post Void Residual)  in office  2. Dysuria He is at risk for UTI due to his diabetes and Jardiance  use.  He was unable to provide a urine specimen today.  Will schedule him for a lab visit at his convenience this week to rule out infectious etiology. - Urinalysis, Complete; Future - CULTURE, URINE COMPREHENSIVE; Future  3. Bilateral inguinal hernia without obstruction or gangrene, recurrence not specified No evidence of incarceration or strangulation in clinic today.  Pain out of proportion with exam, though will have him evaluated by general surgery to discuss possible repair.  We discussed that repair may not resolve his chronic pain. - Ambulatory referral to General Surgery   4. Testicular atrophy Right testicle is particularly atrophic, though descended.  I reassured him that the lump in his right inguinal region is his hernia and not his testicle.  Return for Lab visit for UA/culture this week, cystoscopy with Dr. Twylla soonest available.  Lucie Hones, PA-C  [facility] [place] [address], [address] [place], [place] [address] [phone]

## 2024-06-10 ENCOUNTER — Other Ambulatory Visit: Payer: MEDICAID

## 2024-06-11 ENCOUNTER — Other Ambulatory Visit: Payer: Self-pay | Admitting: Cardiovascular Disease

## 2024-06-11 NOTE — Telephone Encounter (Signed)
 Please contact pt for future appointment. Pt overdue for 6 month f/u.

## 2024-06-13 ENCOUNTER — Other Ambulatory Visit: Payer: Self-pay | Admitting: Urology

## 2024-06-15 ENCOUNTER — Other Ambulatory Visit: Payer: MEDICAID | Admitting: Urology

## 2024-06-17 ENCOUNTER — Encounter: Payer: Self-pay | Admitting: Urology

## 2024-06-17 NOTE — Telephone Encounter (Signed)
 Patient said that he doesn't want to schedule appt right now. He will call back to make an appt. LS
